# Patient Record
Sex: Female | Born: 1995 | Race: Black or African American | Hispanic: Yes | Marital: Married | Smoking: Never smoker
Health system: Southern US, Community
[De-identification: ages and names within clinical notes are randomized; demographics above are authoritative.]

## PROBLEM LIST (undated history)

## (undated) ENCOUNTER — Inpatient Hospital Stay (HOSPITAL_COMMUNITY): Payer: Self-pay

## (undated) DIAGNOSIS — N390 Urinary tract infection, site not specified: Secondary | ICD-10-CM

## (undated) DIAGNOSIS — A749 Chlamydial infection, unspecified: Secondary | ICD-10-CM

## (undated) DIAGNOSIS — R51 Headache: Secondary | ICD-10-CM

## (undated) DIAGNOSIS — D649 Anemia, unspecified: Secondary | ICD-10-CM

## (undated) DIAGNOSIS — R519 Headache, unspecified: Secondary | ICD-10-CM

## (undated) HISTORY — DX: Headache, unspecified: R51.9

## (undated) HISTORY — DX: Headache: R51

## (undated) HISTORY — PX: NO PAST SURGERIES: SHX2092

---
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
## 2018-09-09 ENCOUNTER — Encounter: Payer: Self-pay | Admitting: *Deleted

## 2018-09-10 ENCOUNTER — Ambulatory Visit: Payer: Medicaid Other | Admitting: Diagnostic Neuroimaging

## 2018-11-13 ENCOUNTER — Encounter (HOSPITAL_COMMUNITY): Payer: Self-pay | Admitting: Emergency Medicine

## 2018-11-13 ENCOUNTER — Emergency Department (HOSPITAL_COMMUNITY)
Admission: EM | Admit: 2018-11-13 | Discharge: 2018-11-14 | Disposition: A | Discharge status: Home / Self Care | Payer: BC Managed Care – PPO | Attending: Emergency Medicine | Admitting: Emergency Medicine

## 2018-11-13 ENCOUNTER — Other Ambulatory Visit: Payer: Self-pay

## 2018-11-13 DIAGNOSIS — R51 Headache: Secondary | ICD-10-CM | POA: Insufficient documentation

## 2018-11-13 DIAGNOSIS — R519 Headache, unspecified: Secondary | ICD-10-CM

## 2018-11-13 DIAGNOSIS — Z79899 Other long term (current) drug therapy: Secondary | ICD-10-CM | POA: Insufficient documentation

## 2018-11-13 DIAGNOSIS — R55 Syncope and collapse: Secondary | ICD-10-CM | POA: Insufficient documentation

## 2018-11-13 LAB — BASIC METABOLIC PANEL
Anion gap: 10 (ref 5–15)
BUN: 14 mg/dL (ref 6–20)
CO2: 23 mmol/L (ref 22–32)
Calcium: 9.5 mg/dL (ref 8.9–10.3)
Chloride: 103 mmol/L (ref 98–111)
Creatinine, Ser: 0.78 mg/dL (ref 0.44–1.00)
GFR calc Af Amer: >60 mL/min (ref >60)
GFR calc non Af Amer: >60 mL/min (ref >60)
Glucose, Bld: 91 mg/dL (ref 70–99)
Potassium: 4 mmol/L (ref 3.5–5.1)
Sodium: 136 mmol/L (ref 135–145)

## 2018-11-13 LAB — CBC
HCT: 41.6 % (ref 36.0–46.0)
Hemoglobin: 13.2 g/dL (ref 12.0–15.0)
MCH: 27.6 pg (ref 26.0–34.0)
MCHC: 31.7 g/dL (ref 30.0–36.0)
MCV: 87 fL (ref 80.0–100.0)
Platelets: 320 10*3/uL (ref 150–400)
RBC: 4.78 MIL/uL (ref 3.87–5.11)
RDW: 12.9 % (ref 11.5–15.5)
WBC: 9.8 10*3/uL (ref 4.0–10.5)
nRBC: 0 % (ref 0.0–0.2)

## 2018-11-13 LAB — URINALYSIS, ROUTINE W REFLEX MICROSCOPIC
Bilirubin Urine: NEGATIVE
Glucose, UA: NEGATIVE mg/dL
Ketones, ur: 80 mg/dL — AB
Nitrite: NEGATIVE
Protein, ur: NEGATIVE mg/dL
Specific Gravity, Urine: 1.019 (ref 1.005–1.030)
pH: 6 (ref 5.0–8.0)

## 2018-11-13 LAB — I-STAT BETA HCG BLOOD, ED (MC, WL, AP ONLY): I-stat hCG, quantitative: <5 m[IU]/mL (ref <5)

## 2018-11-13 MED ORDER — PROCHLORPERAZINE EDISYLATE 10 MG/2ML IJ SOLN
10.0000 mg | Freq: Once | INTRAMUSCULAR | Status: AC
  Administered 2018-11-14: 10 mg via INTRAVENOUS
  Filled 2018-11-13: qty 2

## 2018-11-13 MED ORDER — DIPHENHYDRAMINE HCL 50 MG/ML IJ SOLN
25.0000 mg | Freq: Once | INTRAMUSCULAR | Status: AC
  Administered 2018-11-14: 25 mg via INTRAVENOUS
  Filled 2018-11-13: qty 1

## 2018-11-13 MED ORDER — KETOROLAC TROMETHAMINE 15 MG/ML IJ SOLN
15.0000 mg | Freq: Once | INTRAMUSCULAR | Status: AC
  Administered 2018-11-14: 15 mg via INTRAVENOUS
  Filled 2018-11-13: qty 1

## 2018-11-13 MED ORDER — SODIUM CHLORIDE 0.9 % IV BOLUS
1000.0000 mL | Freq: Once | INTRAVENOUS | Status: AC
  Administered 2018-11-14: 1000 mL via INTRAVENOUS

## 2018-11-13 MED ORDER — ONDANSETRON 4 MG PO TBDP
4.0000 mg | ORAL_TABLET | Freq: Once | ORAL | Status: AC | PRN
  Administered 2018-11-13: 4 mg via ORAL
  Filled 2018-11-13: qty 1

## 2018-11-13 NOTE — ED Notes (Signed)
Pt c/o nausea.  

## 2018-11-13 NOTE — ED Triage Notes (Signed)
Pt reports headache that started today as well as a syncopal episode 1 hour PTA. Reports headache has improved, but still present behind her right eye. A&O x 4, ambulatory without difficulty.
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
## 2018-11-14 ENCOUNTER — Emergency Department (HOSPITAL_COMMUNITY): Payer: BC Managed Care – PPO

## 2018-11-14 NOTE — Discharge Instructions (Signed)
Alternate 600 mg of ibuprofen and [phone] mg of Tylenol every 3 hours as needed for pain. Do not exceed 4000 mg of Tylenol daily.  Take ibuprofen with food to avoid upset stomach issues.  Drink plenty of water and get plenty of rest.  Follow-up with your primary care physician or neurologist for reevaluation of your recurrent headaches.  Return to the emergency department if any concerning signs or symptoms develop such as high fevers, worsening headaches,altered mental status, or persistent vomiting.

## 2018-11-14 NOTE — ED Provider Notes (Signed)
Hx of migraines  onset R sided HA, Multiple neuro deficits and syncopal event. Vomit x2 Neuro exam normal Awaiting CT scan. Plan D/C after scan.   Patient CT scan negative.  Symptoms resolved after migraine cocktail.  Patient feels safe for discharge at this time.    Arthor Captain, PA-C 11/14/18 0370    Nira Conn, MD 11/14/18 2330

## 2018-11-14 NOTE — ED Notes (Signed)
Patient verbalizes understanding of discharge instructions. Opportunity for questioning and answers were provided. Armband removed by staff, pt discharged from ED By wheelchair

## 2019-09-30 LAB — OB RESULTS CONSOLE GC/CHLAMYDIA
Chlamydia: NEGATIVE
Gonorrhea: NEGATIVE

## 2019-09-30 LAB — OB RESULTS CONSOLE ABO/RH: RH Type: POSITIVE

## 2019-09-30 LAB — OB RESULTS CONSOLE HEPATITIS B SURFACE ANTIGEN: Hepatitis B Surface Ag: NEGATIVE

## 2019-09-30 LAB — OB RESULTS CONSOLE ANTIBODY SCREEN: Antibody Screen: NEGATIVE

## 2019-09-30 LAB — OB RESULTS CONSOLE HIV ANTIBODY (ROUTINE TESTING): HIV: NONREACTIVE

## 2019-09-30 LAB — OB RESULTS CONSOLE RUBELLA ANTIBODY, IGM: Rubella: IMMUNE

## 2019-09-30 LAB — OB RESULTS CONSOLE RPR: RPR: NONREACTIVE

## 2019-10-31 NOTE — L&D Delivery Note (Signed)
Patient was C/C/+2 and pushed for 60 minutes with epidural.    NSVD  female infant, Apgars 9,9, weight P.   The patient had midline second degree perineal laceration repaired with 2-0 vicryl rapide. Fundus was firm. EBL was expected amount. Placenta was delivered intact but very quickly after birth. Vagina was clear.  Delayed cord clamping done for 30-60 seconds while warming baby. Baby was vigorous and doing skin to skin with mother.  Kayla [name]  

## 2020-02-09 ENCOUNTER — Inpatient Hospital Stay (HOSPITAL_COMMUNITY)
Admission: AD | Admit: 2020-02-09 | Discharge: 2020-02-09 | Disposition: A | Discharge status: Home / Self Care | Payer: BC Managed Care – PPO | Attending: Obstetrics and Gynecology | Admitting: Obstetrics and Gynecology

## 2020-02-09 ENCOUNTER — Other Ambulatory Visit: Payer: Self-pay

## 2020-02-09 ENCOUNTER — Encounter (HOSPITAL_COMMUNITY): Payer: Self-pay | Admitting: Obstetrics and Gynecology

## 2020-02-09 DIAGNOSIS — O368130 Decreased fetal movements, third trimester, not applicable or unspecified: Secondary | ICD-10-CM | POA: Diagnosis not present

## 2020-02-09 DIAGNOSIS — Z3A28 28 weeks gestation of pregnancy: Secondary | ICD-10-CM | POA: Diagnosis not present

## 2020-02-09 DIAGNOSIS — O479 False labor, unspecified: Secondary | ICD-10-CM

## 2020-02-09 HISTORY — DX: Anemia, unspecified: D64.9

## 2020-02-09 NOTE — MAU Provider Note (Signed)
History     CSN: [id number]  Arrival date and time: 02/09/20 1510   First Provider Initiated Contact with Patient 02/09/20 1547      Chief Complaint  Patient presents with  . Abdominal Pain  . Decreased Fetal Movement   Kayla [name] is a 24 year old G1P0 at 28 weeks 4 days who is presenting to MAU for decreased fetal movement and irregular tightening of her abdomen. It will migrate all over, sometimes tightening on her left side, some times on her right. The episodes do not happen regularly, she will have 2-3 an hour at the most. They seem to lessen when she drinks a lot of water. The irregular tightening started over the weekend. She reports that she does drink 6-7 12 ounce water bottles a day.  She had a small smear of bloody-mucous-like discharge on her pillow a few nights ago, but has not had any bleeding since.  She has not felt her baby move since 7 PM on Sunday. 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 She normally feels "him doing somersaults" but admits that she has not been paying close attention to subtle movements.  OB History    Gravida  1   Para  0   Term  0   Preterm  0   AB  0   Living  0     SAB  0   TAB  0   Ectopic  0   Multiple  0   Live Births  0           Past Medical History:  Diagnosis Date  . Anemia    pregnancy  . Headache     History reviewed. No pertinent surgical history.  Family History  Problem Relation Age of Onset  . Hypertension Mother   . Other Mother        lymphodema  . Hypertension Maternal Grandmother   . Stroke Maternal Grandmother   . Hypertension Maternal Grandfather   . Other Maternal Grandfather        blood clotting disorder    Social History   Tobacco Use  . Smoking status: Never Smoker  . Smokeless tobacco: Never Used  Substance Use Topics  . Alcohol use: Not Currently  . Drug use: Never    Allergies: No Known Allergies  Medications Prior to Admission   Medication Sig Dispense Refill Last Dose  . Prenatal Vit-Fe Fumarate-FA (PRENATAL MULTIVITAMIN) TABS tablet Take 1 tablet by mouth daily at 12 noon.   02/08/2020 at Unknown time    Review of Systems  All other systems reviewed and are negative.  Physical Exam   Blood pressure 126/70, pulse 90, temperature (!) 97.4 F (36.3 C), temperature source Oral, resp. rate 18, weight 97.8 kg, SpO2 97 %.  Physical Exam  Nursing note and vitals reviewed. Constitutional: She is oriented to person, place, and time. She appears well-developed and well-nourished.  HENT:  Head: Normocephalic and atraumatic.  Eyes: Pupils are equal, round, and reactive to light. Conjunctivae and EOM are normal.  Cardiovascular: Normal rate, regular rhythm, normal heart sounds and intact distal pulses.  Respiratory: Effort normal and breath sounds normal.  GI: Soft. Bowel sounds are normal. She exhibits no distension and no mass. There is no abdominal tenderness. There is no rebound and no guarding.  Musculoskeletal:        General: Normal range of motion.     Cervical back: Normal range of motion  and neck supple.  Neurological: She is alert and oriented to person, place, and time. She has normal reflexes.  Skin: Skin is warm and dry.  Psychiatric: She has a normal mood and affect. Her behavior is normal. Judgment and thought content normal.    MAU Course  Procedures  MDM -Reassured by FHR -Has felt baby kick several times since presenting to MAU -No contractions on the monitor  NST -baseline: 145 -variability: moderate -accels: 15x15 -decels: none -interpretation: reactive  Assessment and Plan  24 yo G1P0 at 28 weeks 4 days EGA presenting to MAU for DFM and irregular tightening of her abdomen -NST reactive -educated on fetal kick counts -educated on signs/symptoms of labor -encouraged generous PO water intake -follow up with OB this week as scheduled  Jaquavis Felmlee L Shuan Statzer 02/09/2020, 3:57 PM

## 2020-02-09 NOTE — MAU Note (Signed)
No urine culture; scant collection for UA

## 2020-02-09 NOTE — MAU Note (Signed)
Patient states she has been having some abdominal pain for past few days that comes and goes and changes locations on abdomen.  Switches between pain on right side, left side, and sometimes just on the top.  Having pain maybe a few times per hour.  Also noticed some "smears of blood" on her pillow between her legs a few days ago.  Last felt fetal movement Sunday night at 7pm.

## 2020-02-09 NOTE — Discharge Instructions (Signed)
Abdominal Pain During Pregnancy  Belly (abdominal) pain is common during pregnancy. There are many possible causes. Most of the time, it is not a serious problem. Other times, it can be a sign that something is wrong with the pregnancy. Always tell your doctor if you have belly pain. Follow these instructions at home:  Do not have sex or put anything in your vagina until your pain goes away completely.  Get plenty of rest until your pain gets better.  Drink enough fluid to keep your pee (urine) pale yellow.  Take over-the-counter and prescription medicines only as told by your doctor.  Keep all follow-up visits as told by your doctor. This is important. Contact a doctor if:  Your pain continues or gets worse after resting.  You have lower belly pain that: ? Comes and goes at regular times. ? Spreads to your back. ? Feels like menstrual cramps.  You have pain or burning when you pee (urinate). Get help right away if:  You have a fever or chills.  You have vaginal bleeding.  You are leaking fluid from your vagina.  You are passing tissue from your vagina.  You throw up (vomit) for more than 24 hours.  You have watery poop (diarrhea) for more than 24 hours.  Your baby is moving less than usual.  You feel very weak or faint.  You have shortness of breath.  You have very bad pain in your upper belly. Summary  Belly (abdominal) pain is common during pregnancy. There are many possible causes.  If you have belly pain during pregnancy, tell your doctor right away.  Keep all follow-up visits as told by your doctor. This is important. This information is not intended to replace advice given to you by your health care provider. Make sure you discuss any questions you have with your health care provider. Document Revised: 02/03/2019 Document Reviewed: 01/18/2017 Elsevier Patient Education  [place].   Signs and Symptoms of Labor Labor is your body's natural  process of moving your baby, placenta, and umbilical cord out of your uterus. The process of labor usually starts when your baby is full-term, between 28 and 40 weeks of pregnancy. How will I know when I am close to going into labor? As your body prepares for labor and the birth of your baby, you may notice the following symptoms in the weeks and days before true labor starts:  Having a strong desire to get your home ready to receive your new baby. This is called nesting. Nesting may be a sign that labor is approaching, and it may occur several weeks before birth. Nesting may involve cleaning and organizing your home.  Passing a small amount of thick, bloody mucus out of your vagina (normal bloody show or losing your mucus plug). This may happen more than a week before labor begins, or it might occur right before labor begins as the opening of the cervix starts to widen (dilate). For some women, the entire mucus plug passes at once. For others, smaller portions of the mucus plug may gradually pass over several days.  Your baby moving (dropping) lower in your pelvis to get into position for birth (lightening). When this happens, you may feel more pressure on your bladder and pelvic bone and less pressure on your ribs. This may make it easier to breathe. It may also cause you to need to urinate more often and have problems with bowel movements.  Having "practice contractions" (Braxton Hicks contractions) that  occur at irregular (unevenly spaced) intervals that are more than 10 minutes apart. This is also called false labor. False labor contractions are common after exercise or sexual activity, and they will stop if you change position, rest, or drink fluids. These contractions are usually mild and do not get stronger over time. They may feel like: ? A backache or back pain. ? Mild cramps, similar to menstrual cramps. ? Tightening or pressure in your abdomen. Other early symptoms that labor may be  starting soon include:  Nausea or loss of appetite.  Diarrhea.  Having a sudden burst of energy, or feeling very tired.  Mood changes.  Having trouble sleeping. How will I know when labor has begun? Signs that true labor has begun may include:  Having contractions that come at regular (evenly spaced) intervals and increase in intensity. This may feel like more intense tightening or pressure in your abdomen that moves to your back. ? Contractions may also feel like rhythmic pain in your upper thighs or back that comes and goes at regular intervals. ? For first-time mothers, this change in intensity of contractions often occurs at a more gradual pace. ? Women who have given birth before may notice a more rapid progression of contraction changes.  Having a feeling of pressure in the vaginal area.  Your water breaking (rupture of membranes). This is when the sac of fluid that surrounds your baby breaks. When this happens, you will notice fluid leaking from your vagina. This may be clear or blood-tinged. Labor usually starts within 24 hours of your water breaking, but it may take longer to begin. ? Some women notice this as a gush of fluid. ? Others notice that their underwear repeatedly becomes damp. Follow these instructions at home:   When labor starts, or if your water breaks, call your health care provider or nurse care line. Based on your situation, they will determine when you should go in for an exam.  When you are in early labor, you may be able to rest and manage symptoms at home. Some strategies to try at home include: ? Breathing and relaxation techniques. ? Taking a warm bath or shower. ? Listening to music. ? Using a heating pad on the lower back for pain. If you are directed to use heat:  Place a towel between your skin and the heat source.  Leave the heat on for 20-30 minutes.  Remove the heat if your skin turns bright red. This is especially important if you are  unable to feel pain, heat, or cold. You may have a greater risk of getting burned. Get help right away if:  You have painful, regular contractions that are 5 minutes apart or less.  Labor starts before you are [redacted] weeks along in your pregnancy.  You have a fever.  You have a headache that does not go away.  You have bright red blood coming from your vagina.  You do not feel your baby moving.  You have a sudden onset of: ? Severe headache with vision problems. ? Nausea, vomiting, or diarrhea. ? Chest pain or shortness of breath. These symptoms may be an emergency. If your health care provider recommends that you go to the hospital or birth center where you plan to deliver, do not drive yourself. Have someone else drive you, or call emergency services (911 in the [place] Summary  Labor is your body's natural process of moving your baby, placenta, and umbilical cord out of your uterus.  The process of labor usually starts when your baby is full-term, between 19 and 40 weeks of pregnancy.  When labor starts, or if your water breaks, call your health care provider or nurse care line. Based on your situation, they will determine when you should go in for an exam. This information is not intended to replace advice given to you by your health care provider. Make sure you discuss any questions you have with your health care provider. Document Revised: 07/16/2017 Document Reviewed: 03/23/2017 Elsevier Patient Education  2020 Elsevier Inc[employer]   Fetal Movement Counts Patient Name: ________________________________________________ Patient Due Date: ____________________ What is a fetal movement count?  A fetal movement count is the number of times that you feel your baby move during a certain amount of time. This may also be called a fetal kick count. A fetal movement count is recommended for every pregnant woman. You may be asked to start counting fetal movements as early as week 28 of your  pregnancy. Pay attention to when your baby is most active. You may notice your baby's sleep and wake cycles. You may also notice things that make your baby move more. You should do a fetal movement count:  When your baby is normally most active.  At the same time each day. A good time to count movements is while you are resting, after having something to eat and drink. How do I count fetal movements? 1. Find a quiet, comfortable area. Sit, or lie down on your side. 2. Write down the date, the start time and stop time, and the number of movements that you felt between those two times. Take this information with you to your health care visits. 3. Write down your start time when you feel the first movement. 4. Count kicks, flutters, swishes, rolls, and jabs. You should feel at least 10 movements. 5. You may stop counting after you have felt 10 movements, or if you have been counting for 2 hours. Write down the stop time. 6. If you do not feel 10 movements in 2 hours, contact your health care provider for further instructions. Your health care provider may want to do additional tests to assess your baby's well-being. Contact a health care provider if:  You feel fewer than 10 movements in 2 hours.  Your baby is not moving like he or she usually does. Date: ____________ Start time: ____________ Stop time: ____________ Movements: ____________ Date: ____________ Start time: ____________ Stop time: ____________ Movements: ____________ Date: ____________ Start time: ____________ Stop time: ____________ Movements: ____________ Date: ____________ Start time: ____________ Stop time: ____________ Movements: ____________ Date: ____________ Start time: ____________ Stop time: ____________ Movements: ____________ Date: ____________ Start time: ____________ Stop time: ____________ Movements: ____________ Date: ____________ Start time: ____________ Stop time: ____________ Movements: ____________ Date:  ____________ Start time: ____________ Stop time: ____________ Movements: ____________ Date: ____________ Start time: ____________ Stop time: ____________ Movements: ____________ This information is not intended to replace advice given to you by your health care provider. Make sure you discuss any questions you have with your health care provider. Document Revised: 06/05/2019 Document Reviewed: 06/05/2019 Elsevier Patient Education  2020 [employer].

## 2020-03-14 ENCOUNTER — Other Ambulatory Visit: Payer: Self-pay

## 2020-03-14 ENCOUNTER — Inpatient Hospital Stay (HOSPITAL_COMMUNITY)
Admission: AD | Admit: 2020-03-14 | Discharge: 2020-03-14 | Disposition: A | Discharge status: Home / Self Care | Payer: BC Managed Care – PPO | Attending: Obstetrics & Gynecology | Admitting: Obstetrics & Gynecology

## 2020-03-14 DIAGNOSIS — O99891 Other specified diseases and conditions complicating pregnancy: Secondary | ICD-10-CM

## 2020-03-14 DIAGNOSIS — Z3A33 33 weeks gestation of pregnancy: Secondary | ICD-10-CM

## 2020-03-14 DIAGNOSIS — L282 Other prurigo: Secondary | ICD-10-CM | POA: Diagnosis not present

## 2020-03-14 DIAGNOSIS — O26893 Other specified pregnancy related conditions, third trimester: Secondary | ICD-10-CM | POA: Insufficient documentation

## 2020-03-14 DIAGNOSIS — R21 Rash and other nonspecific skin eruption: Secondary | ICD-10-CM | POA: Diagnosis present

## 2020-03-14 DIAGNOSIS — O99713 Diseases of the skin and subcutaneous tissue complicating pregnancy, third trimester: Secondary | ICD-10-CM

## 2020-03-14 DIAGNOSIS — Z8249 Family history of ischemic heart disease and other diseases of the circulatory system: Secondary | ICD-10-CM | POA: Insufficient documentation

## 2020-03-14 DIAGNOSIS — R238 Other skin changes: Secondary | ICD-10-CM | POA: Diagnosis not present

## 2020-03-14 LAB — COMPREHENSIVE METABOLIC PANEL
ALT: 16 U/L (ref 0–44)
AST: 15 U/L (ref 15–41)
Albumin: 2.9 g/dL — ABNORMAL LOW (ref 3.5–5.0)
Alkaline Phosphatase: 74 U/L (ref 38–126)
Anion gap: 12 (ref 5–15)
BUN: 10 mg/dL (ref 6–20)
CO2: 20 mmol/L — ABNORMAL LOW (ref 22–32)
Calcium: 9.3 mg/dL (ref 8.9–10.3)
Chloride: 105 mmol/L (ref 98–111)
Creatinine, Ser: 0.62 mg/dL (ref 0.44–1.00)
GFR calc Af Amer: >60 mL/min (ref >60)
GFR calc non Af Amer: >60 mL/min (ref >60)
Glucose, Bld: 99 mg/dL (ref 70–99)
Potassium: 3.8 mmol/L (ref 3.5–5.1)
Sodium: 137 mmol/L (ref 135–145)
Total Bilirubin: 0.2 mg/dL — ABNORMAL LOW (ref 0.3–1.2)
Total Protein: 6.4 g/dL — ABNORMAL LOW (ref 6.5–8.1)

## 2020-03-14 LAB — URINALYSIS, ROUTINE W REFLEX MICROSCOPIC
Bilirubin Urine: NEGATIVE
Glucose, UA: NEGATIVE mg/dL
Hgb urine dipstick: NEGATIVE
Ketones, ur: NEGATIVE mg/dL
Leukocytes,Ua: NEGATIVE
Nitrite: NEGATIVE
Protein, ur: NEGATIVE mg/dL
Specific Gravity, Urine: 1.021 (ref 1.005–1.030)
pH: 6 (ref 5.0–8.0)

## 2020-03-14 MED ORDER — HYDROCORTISONE 1 % EX CREA
TOPICAL_CREAM | Freq: Once | CUTANEOUS | Status: AC
  Filled 2020-03-14: qty 28

## 2020-03-14 MED ORDER — CALAMINE EX LOTN: 1.0000 "application " | TOPICAL_LOTION | CUTANEOUS | 0 refills | Status: DC | PRN

## 2020-03-14 NOTE — MAU Note (Signed)
Pt reports to MAU c/o a rash on her arms, legs, and feet. Pt reports some itching. Pt is using cortisone cream. No bleeding LOF. +FM. No pain currently.

## 2020-03-14 NOTE — MAU Provider Note (Signed)
History     CSN: [id number]  Arrival date and time: 03/14/20 2046   First Provider Initiated Contact with Patient 03/14/20 2121      Chief Complaint  Patient presents with  . Rash   Kayla [name] is a 24 y.o. G1P0 at [redacted]w[redacted]d who presents to MAU with complaints of rash. Patient reports that she has been noticing pruritis and rash over the past 1-2 days. Started on inner arms and abdomen then pruritus started occurring on inner thighs as well. She reports that pruritis gets worse at night and around striae of abdomen which she reports scratching so hard her skin sometimes bleeds. She denies problems or complications during this pregnancy. Denies abdominal pain, cramping, contractions, vaginal bleeding or discharge. +FM. Patient reports that she wanted to be seen to make sure "everything was okay with baby" and know the necessary follow ups. Patient has appointment in the office on 03/19/20 at [facility].    OB History    Gravida  1   Para  0   Term  0   Preterm  0   AB  0   Living  0     SAB  0   TAB  0   Ectopic  0   Multiple  0   Live Births  0           Past Medical History:  Diagnosis Date  . Anemia    pregnancy  . Headache     No past surgical history on file.  Family History  Problem Relation Age of Onset  . Hypertension Mother   . Other Mother        lymphodema  . Hypertension Maternal Grandmother   . Stroke Maternal Grandmother   . Hypertension Maternal Grandfather   . Other Maternal Grandfather        blood clotting disorder    Social History   Tobacco Use  . Smoking status: Never Smoker  . Smokeless tobacco: Never Used  Substance Use Topics  . Alcohol use: Not Currently  . Drug use: Never    Allergies: No Known Allergies  Medications Prior to Admission  Medication Sig Dispense Refill Last Dose  . Prenatal Vit-Fe Fumarate-FA (PRENATAL MULTIVITAMIN) TABS tablet Take 1 tablet by mouth daily at 12 noon.       Review of  Systems  Constitutional: Negative.   Respiratory: Negative.   Cardiovascular: Negative.   Genitourinary: Negative.   Musculoskeletal: Negative.   Skin: Positive for rash.       Pruritis   Neurological: Negative.   Psychiatric/Behavioral: Negative.    Physical Exam   Blood pressure 122/78, pulse (!) 109, temperature 98.7 F (37.1 C), temperature source Oral, resp. rate 17.  Physical Exam  Nursing note and vitals reviewed. Constitutional: She is oriented to person, place, and time. She appears well-developed and well-nourished. No distress.  Cardiovascular: Normal rate and regular rhythm.  Respiratory: Effort normal and breath sounds normal. No respiratory distress. She has no wheezes.  GI: There is no abdominal tenderness. There is no rebound and no guarding.  Gravid appropriate for gestational age, urticarial papules and erythema noted around striae.   Musculoskeletal:        General: No edema. Normal range of motion.  Neurological: She is alert and oriented to person, place, and time.  Skin: Rash noted.  Pruritic urticarial papules noted on inner arms, inner thighs and around striae   Psychiatric: She has a normal mood and affect. Her behavior  is normal. Thought content normal.    MAU Course  Procedures  MDM Orders Placed This Encounter  Procedures  . Urinalysis, Routine w reflex microscopic  . Bile acids, total  . Comprehensive metabolic panel   Differential diagnoses included Cholestasis of pregnancy vs PEP/PUPPS Discussed with patient what to expect with differential diagnoses and plan of care. Labs ordered but will not return tonight. Discussed with patient can discharge home with hydrocortisone cream and calamine lotion pending labs. Once labs return will call patient to discuss, if bile acid levels are increased or hepatic function labs are increased will have patient follow up in the office sooner than Friday.   Hydrocortisone cream applied in MAU and discussed  with patient medication for home use.   Educated and discussed that PEP is more common in the first pregnancy and there is not affect to fetus with diagnoses. Discussed with patient that if cholestasis is diagnosed then patient will need additional antenatal screening as can affect fetus. Patient verbalizes understanding.   Discussed reasons to return to MAU. Follow up as scheduled in the office. Return to MAU as needed. Pt stable at time of discharge. Will call patient with results of lab work.   Assessment and Plan   1. Pruritic rash   2. Generalized skin papules   3. [redacted] weeks gestation of pregnancy    Discharge home Follow up as scheduled in the office for prenatal care Return to MAU as needed for reasons discussed and/or emergencies  Will call patient with results of lab work, PEP vs Cholestasis  Hydration and [place]  Rx for calamine lotion  Hydrocortisone cream sent home with patient   Follow-up Information    Ob/Gyn, [facility] Follow up.   Why: Follow up as scheduled for prenatal care and return to MAU as needed  Contact information: [address] [place] [address] [phone]          Allergies as of 03/14/2020   No Known Allergies     Medication List    TAKE these medications   calamine lotion Apply 1 application topically as needed for itching.   prenatal multivitamin Tabs tablet Take 1 tablet by mouth daily at 12 noon.       Lajean Manes CNM 03/14/2020, 10:48 PM

## 2020-03-16 ENCOUNTER — Other Ambulatory Visit: Payer: Self-pay

## 2020-03-16 ENCOUNTER — Encounter (HOSPITAL_COMMUNITY): Payer: Self-pay

## 2020-03-16 ENCOUNTER — Inpatient Hospital Stay (HOSPITAL_COMMUNITY)
Admission: EM | Admit: 2020-03-16 | Discharge: 2020-03-16 | Disposition: A | Discharge status: Home / Self Care | Payer: BC Managed Care – PPO | Attending: Obstetrics and Gynecology | Admitting: Obstetrics and Gynecology

## 2020-03-16 DIAGNOSIS — O26893 Other specified pregnancy related conditions, third trimester: Secondary | ICD-10-CM | POA: Insufficient documentation

## 2020-03-16 DIAGNOSIS — R55 Syncope and collapse: Secondary | ICD-10-CM | POA: Diagnosis not present

## 2020-03-16 DIAGNOSIS — Z3A33 33 weeks gestation of pregnancy: Secondary | ICD-10-CM | POA: Insufficient documentation

## 2020-03-16 DIAGNOSIS — R519 Headache, unspecified: Secondary | ICD-10-CM | POA: Diagnosis not present

## 2020-03-16 DIAGNOSIS — G43909 Migraine, unspecified, not intractable, without status migrainosus: Secondary | ICD-10-CM | POA: Insufficient documentation

## 2020-03-16 DIAGNOSIS — Z3689 Encounter for other specified antenatal screening: Secondary | ICD-10-CM

## 2020-03-16 DIAGNOSIS — R21 Rash and other nonspecific skin eruption: Secondary | ICD-10-CM | POA: Insufficient documentation

## 2020-03-16 DIAGNOSIS — O212 Late vomiting of pregnancy: Secondary | ICD-10-CM | POA: Insufficient documentation

## 2020-03-16 LAB — BASIC METABOLIC PANEL
Anion gap: 9 (ref 5–15)
BUN: 6 mg/dL (ref 6–20)
CO2: 20 mmol/L — ABNORMAL LOW (ref 22–32)
Calcium: 8.7 mg/dL — ABNORMAL LOW (ref 8.9–10.3)
Chloride: 106 mmol/L (ref 98–111)
Creatinine, Ser: 0.47 mg/dL (ref 0.44–1.00)
GFR calc Af Amer: >60 mL/min (ref >60)
GFR calc non Af Amer: >60 mL/min (ref >60)
Glucose, Bld: 82 mg/dL (ref 70–99)
Potassium: 4.2 mmol/L (ref 3.5–5.1)
Sodium: 135 mmol/L (ref 135–145)

## 2020-03-16 LAB — URINALYSIS, ROUTINE W REFLEX MICROSCOPIC
Bilirubin Urine: NEGATIVE
Glucose, UA: NEGATIVE mg/dL
Hgb urine dipstick: NEGATIVE
Ketones, ur: 20 mg/dL — AB
Leukocytes,Ua: NEGATIVE
Nitrite: NEGATIVE
Protein, ur: NEGATIVE mg/dL
Specific Gravity, Urine: 1.014 (ref 1.005–1.030)
pH: 6 (ref 5.0–8.0)

## 2020-03-16 LAB — CBC
HCT: 35.3 % — ABNORMAL LOW (ref 36.0–46.0)
Hemoglobin: 11.5 g/dL — ABNORMAL LOW (ref 12.0–15.0)
MCH: 29.1 pg (ref 26.0–34.0)
MCHC: 32.6 g/dL (ref 30.0–36.0)
MCV: 89.4 fL (ref 80.0–100.0)
Platelets: 256 10*3/uL (ref 150–400)
RBC: 3.95 MIL/uL (ref 3.87–5.11)
RDW: 13.2 % (ref 11.5–15.5)
WBC: 9.9 10*3/uL (ref 4.0–10.5)
nRBC: 0 % (ref 0.0–0.2)

## 2020-03-16 LAB — CBG MONITORING, ED: Glucose-Capillary: 84 mg/dL (ref 70–99)

## 2020-03-16 LAB — BILE ACIDS, TOTAL: Bile Acids Total: 5.2 umol/L (ref 0.0–10.0)

## 2020-03-16 MED ORDER — DIPHENHYDRAMINE HCL 25 MG PO TABS
25.0000 mg | ORAL_TABLET | Freq: Four times a day (QID) | ORAL | 0 refills | Status: DC | PRN
Start: 2020-03-16 — End: 2020-05-07

## 2020-03-16 MED ORDER — METOCLOPRAMIDE HCL 5 MG/ML IJ SOLN
10.0000 mg | Freq: Once | INTRAMUSCULAR | Status: AC
  Administered 2020-03-16: 10 mg via INTRAVENOUS
  Filled 2020-03-16: qty 2

## 2020-03-16 MED ORDER — DIPHENHYDRAMINE HCL 50 MG/ML IJ SOLN
25.0000 mg | Freq: Once | INTRAMUSCULAR | Status: AC
  Administered 2020-03-16: 25 mg via INTRAVENOUS
  Filled 2020-03-16: qty 1

## 2020-03-16 MED ORDER — DEXAMETHASONE SODIUM PHOSPHATE 10 MG/ML IJ SOLN
10.0000 mg | Freq: Once | INTRAMUSCULAR | Status: AC
  Administered 2020-03-16: 10 mg via INTRAVENOUS
  Filled 2020-03-16: qty 1

## 2020-03-16 MED ORDER — SODIUM CHLORIDE 0.9% FLUSH: 3.0000 mL | Freq: Once | INTRAVENOUS | Status: DC

## 2020-03-16 MED ORDER — LACTATED RINGERS IV BOLUS
250.0000 mL | Freq: Once | INTRAVENOUS | Status: AC
  Administered 2020-03-16: 250 mL via INTRAVENOUS

## 2020-03-16 NOTE — ED Triage Notes (Signed)
Pt bib [facility] w/ c/o acute onset dizziness, tunnel vision, headache and vomiting. Pt [redacted] weeks pregnant. Denies hx pre-eclampsia or gestational diabetes. Pt received 4 mg IV zofran w/ EMS.
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
## 2020-03-16 NOTE — ED Provider Notes (Signed)
MSE was initiated and I personally evaluated the patient and placed orders (if any) at  3:23 PM on Mar 16, 2020.  Patient G1 P0 at 33 weeks 5 days gestation presenting to the ED via EMS with lightheadedness and vomiting.  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  She also reports left-sided headache which seems more severe than her usual headache, she has history of chronic migraines.  She states she has been having a nausea vomiting throughout her entire pregnancy.  She did not have a syncopal episode though did have some "splotchy: vision.  No known history of hypertension/preeclampsia or gestational diabetes.  No abdominal pain, leakage of fluid or vaginal bleeding reported.  Patient appears stable on evaluation, with stable vital signs.  CBG checked and is 84.  MAU provider, [name], accepting transfer.  The patient appears stable so that the remainder of the MSE may be completed by another provider.   Kayla [name], [place] N, PA-C 03/16/20 1525    Pricilla Loveless, MD 03/19/20 [phone]

## 2020-03-16 NOTE — Discharge Instructions (Signed)
Rash, Adult  A rash is a change in the color of your skin. A rash can also change the way your skin feels. There are many different conditions and factors that can cause a rash. Follow these instructions at home: The goal of treatment is to stop the itching and keep the rash from spreading. Watch for any changes in your symptoms. Let your doctor know about them. Follow these instructions to help with your condition: Medicine Take or apply over-the-counter and prescription medicines only as told by your doctor. These may include medicines:  To treat red or swollen skin (corticosteroid creams).  To treat itching.  To treat an allergy (oral antihistamines).  To treat very bad symptoms (oral corticosteroids).  Skin care  Put cool cloths (compresses) on the affected areas.  Do not scratch or rub your skin.  Avoid covering the rash. Make sure that the rash is exposed to air as much as possible. Managing itching and discomfort  Avoid hot showers or baths. These can make itching worse. A cold shower may help.  Try taking a bath with: ? Epsom salts. You can get these at your local pharmacy or grocery store. Follow the instructions on the package. ? Baking soda. Pour a small amount into the bath as told by your doctor. ? Colloidal oatmeal. You can get this at your local pharmacy or grocery store. Follow the instructions on the package.  Try putting baking soda paste onto your skin. Stir water into baking soda until it gets like a paste.  Try putting on a lotion that relieves itchiness (calamine lotion).  Keep cool and out of the sun. Sweating and being hot can make itching worse. General instructions   Rest as needed.  Drink enough fluid to keep your pee (urine) pale yellow.  Wear loose-fitting clothing.  Avoid scented soaps, detergents, and perfumes. Use gentle soaps, detergents, perfumes, and other cosmetic products.  Avoid anything that causes your rash. Keep a journal to  help track what causes your rash. Write down: ? What you eat. ? What cosmetic products you use. ? What you drink. ? What you wear. This includes jewelry.  Keep all follow-up visits as told by your doctor. This is important. Contact a doctor if:  You sweat at night.  You lose weight.  You pee (urinate) more than normal.  You pee less than normal, or you notice that your pee is a darker color than normal.  You feel weak.  You throw up (vomit).  Your skin or the whites of your eyes look yellow (jaundice).  Your skin: ? Tingles. ? Is numb.  Your rash: ? Does not go away after a few days. ? Gets worse.  You are: ? More thirsty than normal. ? More tired than normal.  You have: ? New symptoms. ? Pain in your belly (abdomen). ? A fever. ? Watery poop (diarrhea). Get help right away if:  You have a fever and your symptoms suddenly get worse.  You start to feel mixed up (confused).  You have a very bad headache or a stiff neck.  You have very bad joint pains or stiffness.  You have jerky movements that you cannot control (seizure).  Your rash covers all or most of your body. The rash may or may not be painful.  You have blisters that: ? Are on top of the rash. ? Grow larger. ? Grow together. ? Are painful. ? Are inside your nose or mouth.  You have a rash   that: ? Looks like purple pinprick-sized spots all over your body. ? Has a "bull's eye" or looks like a target. ? Is red and painful, causes your skin to peel, and is not from being in the sun too long. Summary  A rash is a change in the color of your skin. A rash can also change the way your skin feels.  The goal of treatment is to stop the itching and keep the rash from spreading.  Take or apply over-the-counter and prescription medicines only as told by your doctor.  Contact a doctor if you have new symptoms or symptoms that get worse.  Keep all follow-up visits as told by your doctor. This is  important. This information is not intended to replace advice given to you by your health care provider. Make sure you discuss any questions you have with your health care provider. Document Revised: 02/07/2019 Document Reviewed: 05/20/2018 Elsevier Patient Education  2020 [employer] Inc[employer]  

## 2020-03-16 NOTE — MAU Provider Note (Signed)
History     CSN: [id number]  Arrival date and time: 03/16/20 1432  First Provider Initiated Contact with Patient 03/16/20 1639     Chief Complaint  Patient presents with  . Dizziness  . Headache   HPI Kayla [name] is a 24 y.o. G1P0000 at [redacted]w[redacted]d who presents to MAU from [facility] for evaluation of headache and near-syncopal event while she was at work today. She states she was at rest, talking with her principal and noted new onset tunnel vision and felt faint. She endorses history of syncopal and near-syncopal events which began when she was a teenager. She states she was advised to have a Neurology consult but could not afford the out of pocket cost.  Patient is s/p evaluation in MAU for a rash across her upper and lower extremities on 03/14/2020. She reports using cold compresses and topical Hydrocortisone as advised but has not experienced  very much relief. She states she has checked her house for insects, allergens and cannot identify a cause.  Her rash seems to worsen with heat.  Patient endorses eating some cereal, an orange, a few strawberries and "pizza sticks" so far today.   OB History    Gravida  1   Para  0   Term  0   Preterm  0   AB  0   Living  0     SAB  0   TAB  0   Ectopic  0   Multiple  0   Live Births  0           Past Medical History:  Diagnosis Date  . Anemia    pregnancy  . Headache     History reviewed. No pertinent surgical history.  Family History  Problem Relation Age of Onset  . Hypertension Mother   . Other Mother        lymphodema  . Hypertension Maternal Grandmother   . Stroke Maternal Grandmother   . Hypertension Maternal Grandfather   . Other Maternal Grandfather        blood clotting disorder    Social History   Tobacco Use  . Smoking status: Never Smoker  . Smokeless tobacco: Never Used  Substance Use Topics  . Alcohol use: Not Currently  . Drug use: Never    Allergies:  Allergies  Allergen  Reactions  . Shrimp [Shellfish Allergy] Nausea And Vomiting and Rash    PT states she also gets a fever     Medications Prior to Admission  Medication Sig Dispense Refill Last Dose  . calamine lotion Apply 1 application topically as needed for itching. 120 mL 0 03/15/2020 at Unknown time  . hydrocortisone cream 1 % Apply 1 application topically 2 (two) times daily.   03/16/2020 at Unknown time  . Prenatal Vit-Fe Fumarate-FA (PRENATAL MULTIVITAMIN) TABS tablet Take 1 tablet by mouth daily at 12 noon.   03/15/2020 at Unknown time    Review of Systems  Constitutional: Negative for chills, fatigue and fever.  Gastrointestinal: Negative for abdominal pain, nausea and vomiting.  Musculoskeletal: Negative for back pain.  Neurological: Positive for dizziness.  All other systems reviewed and are negative.  Physical Exam   Blood pressure 116/66, pulse 89, temperature 97.8 F (36.6 C), temperature source Oral, resp. rate 12, height 5\' 4"  (1.626 m), weight 102.1 kg, SpO2 100 %.  Physical Exam  Nursing note and vitals reviewed. Constitutional: She is oriented to person, place, and time. She appears well-developed and well-nourished.  Cardiovascular: Normal rate and normal heart sounds.  Respiratory: Effort normal and breath sounds normal.  GI: Soft.  Gravid  Neurological: She is alert and oriented to person, place, and time.  Skin: Skin is warm and dry. Rash noted.  Raised red rash on arms, legs, toes. Not visible on any other areas of body. Excoriation marks visible. No drainage, no open lesions.   Psychiatric: She has a normal mood and affect. Her behavior is normal. Judgment and thought content normal.    MAU Course/MDM  Procedures   --S/p ICP workup, normal labs 03/14/2020 --Rash assessment including images reviewed with Dr. Phill Myron. Agrees with my recommendation to introduce PO Benadryl, continue topical Hydrocortisone, cold compresses. Rash is noticeably only on exposed areas  of body. Patient advised to revisit home and hygiene soaps, detergents, potential allergens. Avoid heat as this is a consistent trigger --Reactive NST: baseline 130, mod var, + 15 x 15 accels, no decels --Toco: occasional UI            Assessment and Plan  --24 y.o. G1P0000 at [redacted]w[redacted]d  --Reactive tracing --No concerning findings on labs --Continue topical treatment for rash, add PO Benadryl PRN --Pursue existing referral to Neurology --Review daily meals, snacks to ensure adequate nutrition in third trimester --Discharge home in stable condition  F/U: --Next appointment with [facility] is Friday 03/19/2020  Darlina Rumpf, CNM 03/16/2020, 7:27 PM

## 2020-03-19 ENCOUNTER — Other Ambulatory Visit: Payer: BC Managed Care – PPO

## 2020-04-08 LAB — OB RESULTS CONSOLE GBS: GBS: POSITIVE

## 2020-04-26 ENCOUNTER — Other Ambulatory Visit: Payer: Self-pay | Admitting: Obstetrics and Gynecology

## 2020-04-28 ENCOUNTER — Encounter (HOSPITAL_COMMUNITY): Payer: Self-pay | Admitting: *Deleted

## 2020-04-28 ENCOUNTER — Telehealth (HOSPITAL_COMMUNITY): Payer: Self-pay | Admitting: *Deleted

## 2020-04-28 NOTE — Telephone Encounter (Signed)
Preadmission screen  

## 2020-05-04 ENCOUNTER — Other Ambulatory Visit (HOSPITAL_COMMUNITY)
Admission: RE | Admit: 2020-05-04 | Discharge: 2020-05-04 | Disposition: A | Discharge status: Home / Self Care | Payer: BC Managed Care – PPO | Source: Ambulatory Visit | Attending: Obstetrics and Gynecology | Admitting: Obstetrics and Gynecology

## 2020-05-04 DIAGNOSIS — Z01812 Encounter for preprocedural laboratory examination: Secondary | ICD-10-CM | POA: Insufficient documentation

## 2020-05-04 DIAGNOSIS — Z20822 Contact with and (suspected) exposure to covid-19: Secondary | ICD-10-CM | POA: Insufficient documentation

## 2020-05-04 LAB — SARS CORONAVIRUS 2 (TAT 6-24 HRS): SARS Coronavirus 2: NEGATIVE

## 2020-05-05 ENCOUNTER — Other Ambulatory Visit (HOSPITAL_COMMUNITY): Payer: BC Managed Care – PPO

## 2020-05-05 ENCOUNTER — Encounter (HOSPITAL_COMMUNITY): Payer: Self-pay | Admitting: Obstetrics and Gynecology

## 2020-05-05 ENCOUNTER — Inpatient Hospital Stay (HOSPITAL_COMMUNITY)
Admission: AD | Admit: 2020-05-05 | Discharge: 2020-05-05 | Disposition: A | Discharge status: Home / Self Care | Payer: BC Managed Care – PPO | Source: Home / Self Care | Attending: Obstetrics and Gynecology | Admitting: Obstetrics and Gynecology

## 2020-05-05 ENCOUNTER — Other Ambulatory Visit: Payer: Self-pay

## 2020-05-05 DIAGNOSIS — O479 False labor, unspecified: Secondary | ICD-10-CM

## 2020-05-05 NOTE — MAU Note (Signed)
.   Kayla [name] is a 24 y.o. at [redacted]w[redacted]d here in MAU reporting: ctx that are 10 minutes apart that started at 0500 this morning. Patient denies VB or LOF. Endorses good fetal movement. GBS +. Scheduled for midnight induction.  Pain score: 7 Vitals:   05/05/20 0928  BP: 128/83  Pulse: 97  Resp: 15  Temp: 98.1 F (36.7 C)     FHT:144 Lab orders placed from triage:

## 2020-05-05 NOTE — Discharge Instructions (Signed)
Braxton Hicks Contractions °Contractions of the uterus can occur throughout pregnancy, but they are not always a sign that you are in labor. You may have practice contractions called Braxton Hicks contractions. These false labor contractions are sometimes confused with true labor. °What are Braxton Hicks contractions? °Braxton Hicks contractions are tightening movements that occur in the muscles of the uterus before labor. Unlike true labor contractions, these contractions do not result in opening (dilation) and thinning of the cervix. Toward the end of pregnancy (32-34 weeks), Braxton Hicks contractions can happen more often and may become stronger. These contractions are sometimes difficult to tell apart from true labor because they can be very uncomfortable. You should not feel embarrassed if you go to the hospital with false labor. °Sometimes, the only way to tell if you are in true labor is for your health care provider to look for changes in the cervix. The health care provider will do a physical exam and may monitor your contractions. If you are not in true labor, the exam should show that your cervix is not dilating and your water has not broken. °If there are no other health problems associated with your pregnancy, it is completely safe for you to be sent home with false labor. You may continue to have Braxton Hicks contractions until you go into true labor. °How to tell the difference between true labor and false labor °True labor °· Contractions last 30-70 seconds. °· Contractions become very regular. °· Discomfort is usually felt in the top of the uterus, and it spreads to the lower abdomen and low back. °· Contractions do not go away with walking. °· Contractions usually become more intense and increase in frequency. °· The cervix dilates and gets thinner. °False labor °· Contractions are usually shorter and not as strong as true labor contractions. °· Contractions are usually irregular. °· Contractions  are often felt in the front of the lower abdomen and in the groin. °· Contractions may go away when you walk around or change positions while lying down. °· Contractions get weaker and are shorter-lasting as time goes on. °· The cervix usually does not dilate or become thin. °Follow these instructions at home: ° °· Take over-the-counter and prescription medicines only as told by your health care provider. °· Keep up with your usual exercises and follow other instructions from your health care provider. °· Eat and drink lightly if you think you are going into labor. °· If Braxton Hicks contractions are making you uncomfortable: °? Change your position from lying down or resting to walking, or change from walking to resting. °? Sit and rest in a tub of warm water. °? Drink enough fluid to keep your urine pale yellow. Dehydration may cause these contractions. °? Do slow and deep breathing several times an hour. °· Keep all follow-up prenatal visits as told by your health care provider. This is important. °Contact a health care provider if: °· You have a fever. °· You have continuous pain in your abdomen. °Get help right away if: °· Your contractions become stronger, more regular, and closer together. °· You have fluid leaking or gushing from your vagina. °· You pass blood-tinged mucus (bloody show). °· You have bleeding from your vagina. °· You have low back pain that you never had before. °· You feel your baby’s head pushing down and causing pelvic pressure. °· Your baby is not moving inside you as much as it used to. °Summary °· Contractions that occur before labor are   called Braxton Hicks contractions, false labor, or practice contractions. °· Braxton Hicks contractions are usually shorter, weaker, farther apart, and less regular than true labor contractions. True labor contractions usually become progressively stronger and regular, and they become more frequent. °· Manage discomfort from Braxton Hicks contractions  by changing position, resting in a warm bath, drinking plenty of water, or practicing deep breathing. °This information is not intended to replace advice given to you by your health care provider. Make sure you discuss any questions you have with your health care provider. °Document Revised: 09/28/2017 Document Reviewed: 03/01/2017 °Elsevier Patient Education © 2020 [employer] ° °

## 2020-05-06 ENCOUNTER — Inpatient Hospital Stay (HOSPITAL_COMMUNITY): Payer: BC Managed Care – PPO

## 2020-05-06 ENCOUNTER — Inpatient Hospital Stay (HOSPITAL_COMMUNITY): Payer: BC Managed Care – PPO | Admitting: Anesthesiology

## 2020-05-06 ENCOUNTER — Inpatient Hospital Stay (HOSPITAL_COMMUNITY)
Admission: AD | Admit: 2020-05-06 | Discharge: 2020-05-07 | DRG: 807 | Disposition: A | Discharge status: Home / Self Care | Payer: BC Managed Care – PPO | Attending: Obstetrics and Gynecology | Admitting: Obstetrics and Gynecology

## 2020-05-06 ENCOUNTER — Encounter (HOSPITAL_COMMUNITY): Payer: Self-pay | Admitting: Obstetrics and Gynecology

## 2020-05-06 ENCOUNTER — Other Ambulatory Visit: Payer: Self-pay

## 2020-05-06 DIAGNOSIS — Z3A41 41 weeks gestation of pregnancy: Secondary | ICD-10-CM

## 2020-05-06 DIAGNOSIS — Z20822 Contact with and (suspected) exposure to covid-19: Secondary | ICD-10-CM | POA: Diagnosis present

## 2020-05-06 DIAGNOSIS — O99824 Streptococcus B carrier state complicating childbirth: Secondary | ICD-10-CM | POA: Diagnosis present

## 2020-05-06 DIAGNOSIS — O99214 Obesity complicating childbirth: Secondary | ICD-10-CM | POA: Diagnosis present

## 2020-05-06 DIAGNOSIS — O480 Post-term pregnancy: Principal | ICD-10-CM | POA: Diagnosis present

## 2020-05-06 LAB — CBC
HCT: 39 % (ref 36.0–46.0)
Hemoglobin: 12.3 g/dL (ref 12.0–15.0)
MCH: 26.9 pg (ref 26.0–34.0)
MCHC: 31.5 g/dL (ref 30.0–36.0)
MCV: 85.3 fL (ref 80.0–100.0)
Platelets: 323 10*3/uL (ref 150–400)
RBC: 4.57 MIL/uL (ref 3.87–5.11)
RDW: 13.7 % (ref 11.5–15.5)
WBC: 10 10*3/uL (ref 4.0–10.5)
nRBC: 0 % (ref 0.0–0.2)

## 2020-05-06 LAB — ABO/RH: ABO/RH(D): A POS

## 2020-05-06 LAB — TYPE AND SCREEN
ABO/RH(D): A POS
Antibody Screen: NEGATIVE

## 2020-05-06 LAB — RPR: RPR Ser Ql: NONREACTIVE

## 2020-05-06 MED ORDER — LACTATED RINGERS IV SOLN
500.0000 mL | Freq: Once | INTRAVENOUS | Status: AC
  Administered 2020-05-06: 500 mL via INTRAVENOUS

## 2020-05-06 MED ORDER — ONDANSETRON HCL 4 MG/2ML IJ SOLN: 4.0000 mg | INTRAMUSCULAR | Status: DC | PRN

## 2020-05-06 MED ORDER — TERBUTALINE SULFATE 1 MG/ML IJ SOLN: 0.2500 mg | Freq: Once | INTRAMUSCULAR | Status: DC | PRN

## 2020-05-06 MED ORDER — IBUPROFEN 800 MG PO TABS
800.0000 mg | ORAL_TABLET | Freq: Three times a day (TID) | ORAL | Status: DC
  Administered 2020-05-06 – 2020-05-07 (×3): 800 mg via ORAL
  Filled 2020-05-06 (×3): qty 1

## 2020-05-06 MED ORDER — DIBUCAINE (PERIANAL) 1 % EX OINT: 1.0000 "application " | TOPICAL_OINTMENT | CUTANEOUS | Status: DC | PRN

## 2020-05-06 MED ORDER — BENZOCAINE-MENTHOL 20-0.5 % EX AERO
1.0000 "application " | INHALATION_SPRAY | CUTANEOUS | Status: DC | PRN
  Administered 2020-05-07: 1 via TOPICAL
  Filled 2020-05-06: qty 56

## 2020-05-06 MED ORDER — PHENYLEPHRINE 40 MCG/ML (10ML) SYRINGE FOR IV PUSH (FOR BLOOD PRESSURE SUPPORT)
80.0000 ug | PREFILLED_SYRINGE | INTRAVENOUS | Status: DC | PRN
  Administered 2020-05-06 (×2): 80 ug via INTRAVENOUS

## 2020-05-06 MED ORDER — SOD CITRATE-CITRIC ACID 500-334 MG/5ML PO SOLN: 30.0000 mL | ORAL | Status: DC | PRN

## 2020-05-06 MED ORDER — EPHEDRINE 5 MG/ML INJ
10.0000 mg | INTRAVENOUS | Status: DC | PRN
  Filled 2020-05-06: qty 10

## 2020-05-06 MED ORDER — FENTANYL CITRATE (PF) 100 MCG/2ML IJ SOLN
50.0000 ug | INTRAMUSCULAR | Status: DC | PRN
  Administered 2020-05-06 (×3): 100 ug via INTRAVENOUS
  Filled 2020-05-06 (×3): qty 2

## 2020-05-06 MED ORDER — EPHEDRINE 5 MG/ML INJ: 10.0000 mg | INTRAVENOUS | Status: DC | PRN

## 2020-05-06 MED ORDER — PHENYLEPHRINE 40 MCG/ML (10ML) SYRINGE FOR IV PUSH (FOR BLOOD PRESSURE SUPPORT)
80.0000 ug | PREFILLED_SYRINGE | INTRAVENOUS | Status: DC | PRN
  Filled 2020-05-06: qty 10

## 2020-05-06 MED ORDER — ONDANSETRON HCL 4 MG/2ML IJ SOLN: 4.0000 mg | Freq: Four times a day (QID) | INTRAMUSCULAR | Status: DC | PRN

## 2020-05-06 MED ORDER — PENICILLIN G POT IN DEXTROSE 60000 UNIT/ML IV SOLN
3.0000 10*6.[IU] | INTRAVENOUS | Status: DC
  Administered 2020-05-06 (×2): 3 10*6.[IU] via INTRAVENOUS
  Filled 2020-05-06 (×2): qty 50

## 2020-05-06 MED ORDER — SENNOSIDES-DOCUSATE SODIUM 8.6-50 MG PO TABS
2.0000 | ORAL_TABLET | ORAL | Status: DC
  Administered 2020-05-07: 2 via ORAL
  Filled 2020-05-06: qty 2

## 2020-05-06 MED ORDER — SODIUM CHLORIDE 0.9 % IV SOLN
5.0000 10*6.[IU] | Freq: Once | INTRAVENOUS | Status: AC
  Administered 2020-05-06: 5 10*6.[IU] via INTRAVENOUS
  Filled 2020-05-06: qty 5

## 2020-05-06 MED ORDER — OXYTOCIN-SODIUM CHLORIDE 30-0.9 UT/500ML-% IV SOLN
1.0000 m[IU]/min | INTRAVENOUS | Status: DC
  Administered 2020-05-06: 2 m[IU]/min via INTRAVENOUS
  Filled 2020-05-06: qty 500

## 2020-05-06 MED ORDER — ACETAMINOPHEN 325 MG PO TABS
650.0000 mg | ORAL_TABLET | ORAL | Status: DC | PRN
  Administered 2020-05-06: 650 mg via ORAL
  Filled 2020-05-06: qty 2

## 2020-05-06 MED ORDER — DIPHENHYDRAMINE HCL 25 MG PO CAPS: 25.0000 mg | ORAL_CAPSULE | Freq: Four times a day (QID) | ORAL | Status: DC | PRN

## 2020-05-06 MED ORDER — WITCH HAZEL-GLYCERIN EX PADS: 1.0000 "application " | MEDICATED_PAD | CUTANEOUS | Status: DC | PRN

## 2020-05-06 MED ORDER — MEASLES, MUMPS & RUBELLA VAC IJ SOLR: 0.5000 mL | Freq: Once | INTRAMUSCULAR | Status: DC

## 2020-05-06 MED ORDER — LACTATED RINGERS IV SOLN: INTRAVENOUS | Status: DC

## 2020-05-06 MED ORDER — SODIUM CHLORIDE 0.9% FLUSH: 3.0000 mL | Freq: Two times a day (BID) | INTRAVENOUS | Status: DC

## 2020-05-06 MED ORDER — DIPHENHYDRAMINE HCL 50 MG/ML IJ SOLN: 12.5000 mg | INTRAMUSCULAR | Status: DC | PRN

## 2020-05-06 MED ORDER — MAGNESIUM HYDROXIDE 400 MG/5ML PO SUSP: 30.0000 mL | ORAL | Status: DC | PRN

## 2020-05-06 MED ORDER — SODIUM CHLORIDE 0.9 % IV SOLN: 250.0000 mL | INTRAVENOUS | Status: DC | PRN

## 2020-05-06 MED ORDER — ONDANSETRON HCL 4 MG PO TABS: 4.0000 mg | ORAL_TABLET | ORAL | Status: DC | PRN

## 2020-05-06 MED ORDER — SODIUM CHLORIDE 0.9% FLUSH: 3.0000 mL | INTRAVENOUS | Status: DC | PRN

## 2020-05-06 MED ORDER — PRENATAL MULTIVITAMIN CH
1.0000 | ORAL_TABLET | Freq: Every day | ORAL | Status: DC
  Administered 2020-05-07: 1 via ORAL
  Filled 2020-05-06: qty 1

## 2020-05-06 MED ORDER — TETANUS-DIPHTH-ACELL PERTUSSIS 5-2.5-18.5 LF-MCG/0.5 IM SUSP: 0.5000 mL | Freq: Once | INTRAMUSCULAR | Status: DC

## 2020-05-06 MED ORDER — ZOLPIDEM TARTRATE 5 MG PO TABS: 5.0000 mg | ORAL_TABLET | Freq: Every evening | ORAL | Status: DC | PRN

## 2020-05-06 MED ORDER — METHYLERGONOVINE MALEATE 0.2 MG PO TABS: 0.2000 mg | ORAL_TABLET | ORAL | Status: DC | PRN

## 2020-05-06 MED ORDER — LACTATED RINGERS IV SOLN: 500.0000 mL | INTRAVENOUS | Status: DC | PRN

## 2020-05-06 MED ORDER — METHYLERGONOVINE MALEATE 0.2 MG/ML IJ SOLN: 0.2000 mg | INTRAMUSCULAR | Status: DC | PRN

## 2020-05-06 MED ORDER — OXYTOCIN BOLUS FROM INFUSION
333.0000 mL | Freq: Once | INTRAVENOUS | Status: AC
  Administered 2020-05-06: 333 mL via INTRAVENOUS

## 2020-05-06 MED ORDER — SIMETHICONE 80 MG PO CHEW: 80.0000 mg | CHEWABLE_TABLET | ORAL | Status: DC | PRN

## 2020-05-06 MED ORDER — FERROUS SULFATE 325 (65 FE) MG PO TABS
325.0000 mg | ORAL_TABLET | Freq: Two times a day (BID) | ORAL | Status: DC
  Administered 2020-05-07: 325 mg via ORAL
  Filled 2020-05-06: qty 1

## 2020-05-06 MED ORDER — OXYCODONE-ACETAMINOPHEN 5-325 MG PO TABS
2.0000 | ORAL_TABLET | ORAL | Status: DC | PRN
  Administered 2020-05-07: 1 via ORAL
  Filled 2020-05-06: qty 2

## 2020-05-06 MED ORDER — SODIUM CHLORIDE (PF) 0.9 % IJ SOLN
INTRAMUSCULAR | Status: DC | PRN
  Administered 2020-05-06: 12 mL/h via EPIDURAL

## 2020-05-06 MED ORDER — LIDOCAINE HCL (PF) 1 % IJ SOLN: 30.0000 mL | INTRAMUSCULAR | Status: DC | PRN

## 2020-05-06 MED ORDER — OXYTOCIN-SODIUM CHLORIDE 30-0.9 UT/500ML-% IV SOLN: 2.5000 [IU]/h | INTRAVENOUS | Status: DC

## 2020-05-06 MED ORDER — MISOPROSTOL 25 MCG QUARTER TABLET
25.0000 ug | ORAL_TABLET | ORAL | Status: DC | PRN
  Administered 2020-05-06: 25 ug via VAGINAL
  Filled 2020-05-06: qty 1

## 2020-05-06 MED ORDER — FENTANYL-BUPIVACAINE-NACL 0.5-0.125-0.9 MG/250ML-% EP SOLN
12.0000 mL/h | EPIDURAL | Status: DC | PRN
  Filled 2020-05-06: qty 250

## 2020-05-06 MED ORDER — COCONUT OIL OIL: 1.0000 "application " | TOPICAL_OIL | Status: DC | PRN

## 2020-05-06 MED ORDER — ACETAMINOPHEN 325 MG PO TABS
650.0000 mg | ORAL_TABLET | ORAL | Status: DC | PRN
  Administered 2020-05-07: 650 mg via ORAL
  Filled 2020-05-06: qty 2

## 2020-05-06 MED ORDER — LIDOCAINE HCL (PF) 1 % IJ SOLN
INTRAMUSCULAR | Status: DC | PRN
  Administered 2020-05-06 (×2): 5 mL via EPIDURAL

## 2020-05-06 NOTE — Anesthesia Postprocedure Evaluation (Signed)
Anesthesia Post Note  Patient: Kayla [name]  Procedure(s) Performed: AN AD HOC LABOR EPIDURAL     Patient location during evaluation: Mother Baby Anesthesia Type: Epidural Level of consciousness: awake and alert Pain management: pain level controlled Vital Signs Assessment: post-procedure vital signs reviewed and stable Respiratory status: spontaneous breathing, nonlabored ventilation and respiratory function stable Cardiovascular status: stable Postop Assessment: no headache, no backache and epidural receding Anesthetic complications: no   No complications documented.  Last Vitals:  Vitals:   05/06/20 1630 05/06/20 1712  BP: 116/70 120/61  Pulse: 84 96  Resp: 18 20  Temp:  36.6 C  SpO2:      Last Pain:  Vitals:   05/06/20 1712  TempSrc: Oral  PainSc: 0-No pain   Pain Goal:                Epidural/Spinal Function Cutaneous sensation: Tingles (05/06/20 1712), Patient able to flex knees: No (05/06/20 1712), Patient able to lift hips off bed: No (05/06/20 1712), Back pain beyond tenderness at insertion site: No (05/06/20 1712), Progressively worsening motor and/or sensory loss: No (05/06/20 1712)  Adelae Yodice

## 2020-05-06 NOTE — Anesthesia Procedure Notes (Signed)
Epidural Patient location during procedure: OB Start time: 05/06/2020 6:54 AM End time: 05/06/2020 7:06 AM  Staffing Anesthesiologist: Heather Roberts, MD Performed: anesthesiologist   Preanesthetic Checklist Completed: patient identified, IV checked, site marked, risks and benefits discussed, monitors and equipment checked, pre-op evaluation and timeout performed  Epidural Patient position: sitting Prep: DuraPrep Patient monitoring: heart rate, cardiac monitor, continuous pulse ox and blood pressure Approach: midline Location: L2-L3 Injection technique: LOR saline  Needle:  Needle type: Tuohy  Needle gauge: 17 G Needle length: 9 cm Needle insertion depth: 8 cm Catheter size: 20 Guage Catheter at skin depth: 11 cm Test dose: negative and Other  Assessment Events: blood not aspirated, injection not painful, no injection resistance and negative IV test  Additional Notes Informed consent obtained prior to proceeding including risk of failure, 1% risk of PDPH, risk of minor discomfort and bruising.  Discussed rare but serious complications including epidural abscess, permanent nerve injury, epidural hematoma.  Discussed alternatives to epidural analgesia and patient desires to proceed.  Timeout performed pre-procedure verifying patient name, procedure, and platelet count.  Patient tolerated procedure well.

## 2020-05-06 NOTE — H&P (Signed)
24 y.o. [redacted]w[redacted]d  G1P0000 comes in for postdates induction.  Otherwise has good fetal movement and no bleeding.  Past Medical History:  Diagnosis Date  . Anemia    pregnancy  . Headache     Past Surgical History:  Procedure Laterality Date  . NO PAST SURGERIES      OB History  Gravida Para Term Preterm AB Living  1 0 0 0 0 0  SAB TAB Ectopic Multiple Live Births  0 0 0 0 0    # Outcome Date GA Lbr Len/2nd Weight Sex Delivery Anes PTL Lv  1 Current             Social History   Socioeconomic History  . Marital status: Single    Spouse name: Not on file  . Number of children: Not on file  . Years of education: Not on file  . Highest education level: Not on file  Occupational History  . Not on file  Tobacco Use  . Smoking status: Never Smoker  . Smokeless tobacco: Never Used  Vaping Use  . Vaping Use: Never used  Substance and Sexual Activity  . Alcohol use: Not Currently  . Drug use: Never  . Sexual activity: Not Currently  Other Topics Concern  . Not on file  Social History Narrative  . Not on file   Social Determinants of Health   Financial Resource Strain:   . Difficulty of Paying Living Expenses:   Food Insecurity:   . Worried About Programme researcher, broadcasting/film/video in the Last Year:   . Barista in the Last Year:   Transportation Needs:   . Freight forwarder (Medical):   Marland Kitchen Lack of Transportation (Non-Medical):   Physical Activity:   . Days of Exercise per Week:   . Minutes of Exercise per Session:   Stress:   . Feeling of Stress :   Social Connections:   . Frequency of Communication with Friends and Family:   . Frequency of Social Gatherings with Friends and Family:   . Attends Religious Services:   . Active Member of Clubs or Organizations:   . Attends Banker Meetings:   Marland Kitchen Marital Status:   Intimate Partner Violence:   . Fear of Current or Ex-Partner:   . Emotionally Abused:   Marland Kitchen Physically Abused:   . Sexually Abused:    Shrimp  [shellfish allergy]    Prenatal Transfer Tool  Maternal Diabetes: No Genetic Screening: Declined Maternal Ultrasounds/Referrals: Normal Fetal Ultrasounds or other Referrals:  None Maternal Substance Abuse:  No Significant Maternal Medications:  None Significant Maternal Lab Results: Group B Strep positive  Other PNC: uncomplicated.    Vitals:   05/06/20 0706 05/06/20 0710 05/06/20 0725 05/06/20 0730  BP: (!) 120/59 118/74 108/63 105/60  Pulse: (!) 108 96 (!) 102 91  Resp:  20 16 18   Temp:  98.3 F (36.8 C)    TempSrc:  Oral    SpO2:  98% 95% 95%  Weight:      Height:        Lungs/Cor:  NAD Abdomen:  soft, gravid Ex:  no cords, erythema SVE:  4/100/-1 per nurse FHTs:  140s, good STV, NST R; Cat 1 tracing although recently 2 variable decels Toco:  q 2-4   A/P   Term induction with cytotec, now can move to pitocin.  Will AROM.  GBS POS- PCN given.  

## 2020-05-06 NOTE — Anesthesia Preprocedure Evaluation (Signed)
Anesthesia Evaluation  Patient identified by MRN, date of birth, ID band Patient awake    Reviewed: Allergy & Precautions, NPO status , Patient's Chart, lab work & pertinent test results  History of Anesthesia Complications Negative for: history of anesthetic complications  Airway Mallampati: II  TM Distance: >3 FB Neck ROM: Full    Dental no notable dental hx. (+) Dental Advisory Given   Pulmonary neg pulmonary ROS,    Pulmonary exam normal        Cardiovascular negative cardio ROS Normal cardiovascular exam     Neuro/Psych  Headaches, negative psych ROS   GI/Hepatic negative GI ROS, Neg liver ROS,   Endo/Other  Morbid obesity  Renal/GU negative Renal ROS  negative genitourinary   Musculoskeletal negative musculoskeletal ROS (+)   Abdominal   Peds negative pediatric ROS (+)  Hematology negative hematology ROS (+)   Anesthesia Other Findings   Reproductive/Obstetrics (+) Pregnancy                             Anesthesia Physical Anesthesia Plan  ASA: III  Anesthesia Plan: Epidural   Post-op Pain Management:    Induction:   PONV Risk Score and Plan:   Airway Management Planned: Natural Airway  Additional Equipment:   Intra-op Plan:   Post-operative Plan:   Informed Consent: I have reviewed the patients History and Physical, chart, labs and discussed the procedure including the risks, benefits and alternatives for the proposed anesthesia with the patient or authorized representative who has indicated his/her understanding and acceptance.     Dental advisory given  Plan Discussed with: CRNA and Anesthesiologist  Anesthesia Plan Comments:         Anesthesia Quick Evaluation

## 2020-05-07 LAB — CBC
HCT: 30.1 % — ABNORMAL LOW (ref 36.0–46.0)
Hemoglobin: 9.6 g/dL — ABNORMAL LOW (ref 12.0–15.0)
MCH: 27 pg (ref 26.0–34.0)
MCHC: 31.9 g/dL (ref 30.0–36.0)
MCV: 84.8 fL (ref 80.0–100.0)
Platelets: 214 10*3/uL (ref 150–400)
RBC: 3.55 MIL/uL — ABNORMAL LOW (ref 3.87–5.11)
RDW: 13.7 % (ref 11.5–15.5)
WBC: 12.2 10*3/uL — ABNORMAL HIGH (ref 4.0–10.5)
nRBC: 0 % (ref 0.0–0.2)

## 2020-05-07 MED ORDER — IBUPROFEN 800 MG PO TABS: 800.0000 mg | ORAL_TABLET | Freq: Three times a day (TID) | ORAL | 0 refills | Status: DC

## 2020-05-07 NOTE — Progress Notes (Signed)
PPD#1 Pt without complaints. Would like to go home. Lochia mild VSSAF Hgb-9.5 IMP/ Doing well Plan/ Will discharge to home.

## 2020-05-07 NOTE — Discharge Summary (Signed)
     Postpartum Discharge Summary      Patient Name: Kayla [name] DOB: 04-29-1996 MRN: [id number]  Date of admission: 05/06/2020 Delivery date:05/06/2020  Delivering provider: Carrington Clamp  Date of discharge: 05/07/2020  Admitting diagnosis: Post-dates pregnancy [O48.0] Intrauterine pregnancy: [redacted]w[redacted]d       Hospital course: Onset of Labor With Vaginal Delivery      24 y.o. yo G1P1001 at [redacted]w[redacted]d was admitted  on 05/06/2020. Patient had an uncomplicated labor course as follows:  Membrane Rupture Time/Date: 1:38 PM ,05/06/2020   Delivery Method:Vaginal, Spontaneous  Episiotomy: None  Lacerations:  2nd degree;Perineal  Patient had an uncomplicated postpartum course.  She is ambulating, tolerating a regular diet, passing flatus, and urinating well. Patient is discharged home in stable condition on 05/07/20.  Newborn Data: Birth date:05/06/2020  Birth time:3:22 PM  Gender:Female  Living status:Living  Apgars:8 ,9  Weight:3325 g     Vitals:   05/06/20 2000 05/06/20 2100 05/07/20 0045 05/07/20 0445  BP: 127/76 112/69 116/68 114/62  Pulse: 98 90 75 86  Resp: 18 18 18 18   Temp: 98.8 F (37.1 C) 97.8 F (36.6 C) 98.7 F (37.1 C) 98.1 F (36.7 C)  TempSrc: Oral Oral Oral Oral  SpO2: 99% 100% 100% 100%  Weight:      Height:       General: alert Lochia: appropriate Labs: Lab Results  Component Value Date   WBC 12.2 (H) 05/07/2020   HGB 9.6 (L) 05/07/2020   HCT 30.1 (L) 05/07/2020   MCV 84.8 05/07/2020   PLT 214 05/07/2020   CMP Latest Ref Rng & Units 03/16/2020  Glucose 70 - 99 mg/dL 82  BUN 6 - 20 mg/dL 6  Creatinine 03/18/2020 - 4.13 mg/dL 2.44  Sodium 0.10 - 272 mmol/L 135  Potassium 3.5 - 5.1 mmol/L 4.2  Chloride 98 - 111 mmol/L 106  CO2 22 - 32 mmol/L 20(L)  Calcium 8.9 - 10.3 mg/dL 536)  Total Protein 6.5 - 8.1 g/dL -  Total Bilirubin 0.3 - 1.2 mg/dL -  Alkaline Phos 38 - 6.4(Q U/L -  AST 15 - 41 U/L -  ALT 0 - 44 U/L -     After visit meds:  Allergies as of  05/07/2020      Reactions   Shrimp [shellfish Allergy] Nausea And Vomiting, Rash   PT states she also gets a fever       Medication List    STOP taking these medications   calamine lotion   diphenhydrAMINE 25 MG tablet Commonly known as: BENADRYL   hydrocortisone cream 1 %     TAKE these medications   ibuprofen 800 MG tablet Commonly known as: ADVIL Take 1 tablet (800 mg total) by mouth 3 (three) times daily.   prenatal multivitamin Tabs tablet Take 1 tablet by mouth daily at 12 noon.        Discharge home in stable condition Infant Feeding: Breast Infant Disposition:home with mother Discharge instruction: per After Visit Summary and Postpartum booklet. Activity: Advance as tolerated. Pelvic rest for 6 weeks.  Diet: routine diet Follow up Visit:  Follow-up Information    07/08/2020, MD. Schedule an appointment as soon as possible for a visit in 1 month(s).   Specialty: Obstetrics and Gynecology Contact information: [address] [place] [phone]                   05/07/2020 07/08/2020, MD

## 2020-05-07 NOTE — Lactation Note (Signed)
This note was copied from a baby's chart. Lactation Consultation Note  Patient Name: Boy Stana Bayon CHYIF'O Date: 05/07/2020 Reason for consult: Initial assessment;Difficult latch;Primapara;Term Baby is 61 hours old.  Lactation order just received.  Mom has been formula feeding due to difficult latch.  She states she tried using a nipple shield last night but it was not helpful.  Both nipples are flat but compressible.  Mom pre pumped x 5 minutes with symphony pump.  Drop of colostrum expressed.  Assisted with positioning baby in football hold.  Baby became very fussy.  After a few attempts baby latched off and on for 5 minutes. Mom felt like baby was biting and decided to take a break.  Breast shells given with instructions.  Instructed to pump every 3 hours x 15-20 minutes.  Instructed mom to attempt latching baby with feeding cues and call for assist prn.  Breastfeeding consultation services and support information given to patient.  Maternal Data Has patient been taught Hand Expression?: Yes Does the patient have breastfeeding experience prior to this delivery?: No  Feeding Feeding Type: Breast Fed Nipple Type: Slow - flow  LATCH Score Latch: Repeated attempts needed to sustain latch, nipple held in mouth throughout feeding, stimulation needed to elicit sucking reflex.  Audible Swallowing: None  Type of Nipple: Flat  Comfort (Breast/Nipple): Soft / non-tender  Hold (Positioning): Assistance needed to correctly position infant at breast and maintain latch.  LATCH Score: 5  Interventions Interventions: Shells;DEBP  Lactation Tools Discussed/Used Tools: Pump;Shells Shell Type: Inverted Breast pump type: Double-Electric Breast Pump Pump Review: Setup, frequency, and cleaning;Milk Storage Initiated by:: [email or web address] Date initiated:: 05/07/20   Consult Status Consult Status: Follow-up Date: 05/08/20 Follow-up type: In-patient    Huston Foley 05/07/2020, 1:47  PM

## 2020-05-08 ENCOUNTER — Ambulatory Visit: Payer: Self-pay

## 2020-05-08 NOTE — Lactation Note (Signed)
This note was copied from a baby's chart. Lactation Consultation Note  Patient Name: Kayla [name] [id number] Date: 05/08/2020 Reason for consult: Follow-up assessment;Term  Mom is currently bottle feeding with formula. She had some difficulty with latch at first, last attempt 10 pm last night. She met with the [facility] and states she feels more confident with the babies latch especially on the left. Education given on engorgement prevention, milk supply and nipple care. Mom instructed to use coconut oil and expressed colostrum on nipples in between feeding. If Mom is not successful with latch, she was instructed to increase volume of formula to 1/ once to 1 ounce per feed. Mom has a pump at home she will use to increase supply and has a f/u appointment with [facility] on Monday.   Maternal Data Formula Feeding for Exclusion: Yes Reason for exclusion: Mother's choice to formula feed on admision  Feeding Feeding Type: Bottle Fed - Formula Nipple Type: Slow - flow  LATCH Score                   Interventions    Lactation Tools Discussed/Used     Consult Status Consult Status: Complete Date: 05/08/20 Follow-up type: Call as needed    Kayla Desta  [name] 05/08/2020, 8:54 AM

## 2020-05-10 LAB — SURGICAL PATHOLOGY

## 2020-10-30 DIAGNOSIS — K8020 Calculus of gallbladder without cholecystitis without obstruction: Secondary | ICD-10-CM

## 2020-10-30 HISTORY — DX: Calculus of gallbladder without cholecystitis without obstruction: K80.20

## 2020-12-05 ENCOUNTER — Emergency Department (HOSPITAL_COMMUNITY): Payer: Medicaid Other

## 2020-12-05 ENCOUNTER — Other Ambulatory Visit: Payer: Self-pay

## 2020-12-05 ENCOUNTER — Emergency Department (HOSPITAL_COMMUNITY)
Admission: EM | Admit: 2020-12-05 | Discharge: 2020-12-06 | Disposition: A | Discharge status: Home / Self Care | Payer: Medicaid Other | Attending: Emergency Medicine | Admitting: Emergency Medicine

## 2020-12-05 DIAGNOSIS — R1011 Right upper quadrant pain: Secondary | ICD-10-CM | POA: Diagnosis present

## 2020-12-05 DIAGNOSIS — R110 Nausea: Secondary | ICD-10-CM | POA: Insufficient documentation

## 2020-12-05 DIAGNOSIS — K8080 Other cholelithiasis without obstruction: Secondary | ICD-10-CM | POA: Insufficient documentation

## 2020-12-05 DIAGNOSIS — R109 Unspecified abdominal pain: Secondary | ICD-10-CM

## 2020-12-05 LAB — COMPREHENSIVE METABOLIC PANEL
ALT: 202 U/L — ABNORMAL HIGH (ref 0–44)
AST: 165 U/L — ABNORMAL HIGH (ref 15–41)
Albumin: 3.6 g/dL (ref 3.5–5.0)
Alkaline Phosphatase: 81 U/L (ref 38–126)
Anion gap: 9 (ref 5–15)
BUN: 10 mg/dL (ref 6–20)
CO2: 25 mmol/L (ref 22–32)
Calcium: 9.1 mg/dL (ref 8.9–10.3)
Chloride: 104 mmol/L (ref 98–111)
Creatinine, Ser: 0.79 mg/dL (ref 0.44–1.00)
GFR, Estimated: >60 mL/min (ref >60)
Glucose, Bld: 111 mg/dL — ABNORMAL HIGH (ref 70–99)
Potassium: 3.6 mmol/L (ref 3.5–5.1)
Sodium: 138 mmol/L (ref 135–145)
Total Bilirubin: 1.6 mg/dL — ABNORMAL HIGH (ref 0.3–1.2)
Total Protein: 7.3 g/dL (ref 6.5–8.1)

## 2020-12-05 LAB — URINALYSIS, ROUTINE W REFLEX MICROSCOPIC
Bilirubin Urine: NEGATIVE
Glucose, UA: NEGATIVE mg/dL
Ketones, ur: 5 mg/dL — AB
Leukocytes,Ua: NEGATIVE
Nitrite: NEGATIVE
Protein, ur: 100 mg/dL — AB
RBC / HPF: >50 RBC/hpf — ABNORMAL HIGH (ref 0–5)
Specific Gravity, Urine: 1.026 (ref 1.005–1.030)
pH: 5 (ref 5.0–8.0)

## 2020-12-05 LAB — CBC
HCT: 42.3 % (ref 36.0–46.0)
Hemoglobin: 13.2 g/dL (ref 12.0–15.0)
MCH: 27.4 pg (ref 26.0–34.0)
MCHC: 31.2 g/dL (ref 30.0–36.0)
MCV: 87.9 fL (ref 80.0–100.0)
Platelets: 341 10*3/uL (ref 150–400)
RBC: 4.81 MIL/uL (ref 3.87–5.11)
RDW: 13.4 % (ref 11.5–15.5)
WBC: 9.6 10*3/uL (ref 4.0–10.5)
nRBC: 0 % (ref 0.0–0.2)

## 2020-12-05 LAB — I-STAT BETA HCG BLOOD, ED (MC, WL, AP ONLY): I-stat hCG, quantitative: <5 m[IU]/mL (ref <5)

## 2020-12-05 LAB — LIPASE, BLOOD: Lipase: 36 U/L (ref 11–51)

## 2020-12-05 NOTE — ED Triage Notes (Signed)
Pt presents to ED POV. Pt c/o upper abd pain that radiates towards back. Pt reports that pain occurs at night when she lies down. Pain has been increasing since October. Pt reports that she has seen by PCP and told she needs to be seen by GI specialist.

## 2020-12-06 ENCOUNTER — Emergency Department (HOSPITAL_COMMUNITY): Payer: Medicaid Other

## 2020-12-06 MED ORDER — IBUPROFEN 400 MG PO TABS
600.0000 mg | ORAL_TABLET | Freq: Once | ORAL | Status: AC
  Administered 2020-12-06: 600 mg via ORAL
  Filled 2020-12-06: qty 1

## 2020-12-06 MED ORDER — GADOBUTROL 1 MMOL/ML IV SOLN
8.5000 mL | Freq: Once | INTRAVENOUS | Status: AC | PRN
  Administered 2020-12-06: 8.5 mL via INTRAVENOUS

## 2020-12-06 NOTE — Discharge Instructions (Signed)
Follow up as directed by surgery.  Avoid fatty or greasy food.

## 2020-12-06 NOTE — Consult Note (Signed)
Neosha Switalski [facility] 31-Aug-1996  [id number].    Requesting MD: Cheron Schaumann Chief Complaint/Reason for Consult: symptomatic cholelithiasis  HPI:  Ms. Palazzola is a 25 yo female who has been having intermittent RUQ for the past few months. It started shortly after she delivered her baby. It begins after she eats and usually lasts for a few minutes. She also sometimes has nausea and vomiting. The pain begins in the RUQ and radiates across her upper abdomen and to her back. She presented to the ED after having a more severe episode of pain. She is afebrile and hemodynamically stable in the ED. Labs are significant for a mild transaminitis and Tbili of 1.6. Lipase is normal. RUQ US showed gallstones with mild CBD dilation. A MRCP was done and showed no evidence of choledocholithiasis. General surgery was consulted for further evaluation. The patient's pain has now resolved and she is feeling well. She has had no prior abdominal surgeries.  ROS: Review of Systems  Constitutional: Negative for fever.  Respiratory: Negative for shortness of breath and stridor.   Cardiovascular: Negative for chest pain.  Gastrointestinal: Positive for abdominal pain and nausea.  Musculoskeletal: Negative for joint pain.  Skin: Negative for rash.  Neurological: Negative for speech change and focal weakness.    Family History  Problem Relation Age of Onset  . Hypertension Mother   . Other Mother        lymphodema  . Hypertension Maternal Grandmother   . Stroke Maternal Grandmother   . Hypertension Maternal Grandfather   . Other Maternal Grandfather        blood clotting disorder    Past Medical History:  Diagnosis Date  . Anemia    pregnancy  . Headache     Past Surgical History:  Procedure Laterality Date  . NO PAST SURGERIES      Social History:  reports that she has never smoked. She has never used smokeless tobacco. She reports previous alcohol use. She reports that she does not use  drugs.  Allergies:  Allergies  Allergen Reactions  . Shrimp [Shellfish Allergy] Nausea And Vomiting and Rash    PT states she also gets a fever     (Not in a hospital admission)    Physical Exam: Blood pressure 131/84, pulse 63, temperature 98 F (36.7 C), temperature source Oral, resp. rate 17, height 5\' 4"  (1.626 m), weight 86.2 kg, SpO2 100 %, unknown if currently breastfeeding. General: resting comfortably, appears stated age, no apparent distress Neurological: alert and oriented, no focal deficits, cranial nerves grossly in tact HEENT: normocephalic, atraumatic, oropharynx clear, no scleral icterus CV: regular rate and rhythm, no murmurs, extremities warm and well-perfused Respiratory: normal work of breathing, lungs clear to auscultation bilaterally, symmetric chest wall expansion Abdomen: soft, nondistended, minimally tender in RUQ. No masses or organomegaly. No surgical scars. Extremities: warm and well-perfused, no deformities, moving all extremities spontaneously Psychiatric: normal mood and affect Skin: warm and dry, no jaundice, no rashes or lesions   Results for orders placed or performed during the hospital encounter of 12/05/20 (from the past 48 hour(s))  Lipase, blood     Status: None   Collection Time: 12/05/20  8:30 PM  Result Value Ref Range   Lipase 36 11 - 51 U/L    Comment: Performed at [facility], [address], [place], [place]  Comprehensive metabolic panel     Status: Abnormal   Collection Time: 12/05/20  8:30 PM  Result  Value Ref Range   Sodium 138 135 - 145 mmol/L   Potassium 3.6 3.5 - 5.1 mmol/L   Chloride 104 98 - 111 mmol/L   CO2 25 22 - 32 mmol/L   Glucose, Bld 111 (H) 70 - 99 mg/dL    Comment: Glucose reference range applies only to samples taken after fasting for at least 8 hours.   BUN 10 6 - 20 mg/dL   Creatinine, Ser 6.76 0.44 - 1.00 mg/dL   Calcium 9.1 8.9 - 19.5 mg/dL   Total Protein 7.3 6.5 - 8.1 g/dL   Albumin  3.6 3.5 - 5.0 g/dL   AST 093 (H) 15 - 41 U/L   ALT 202 (H) 0 - 44 U/L   Alkaline Phosphatase 81 38 - 126 U/L   Total Bilirubin 1.6 (H) 0.3 - 1.2 mg/dL   GFR, Estimated >26 >71 mL/min    Comment: (NOTE) Calculated using the CKD-EPI Creatinine Equation (2021)    Anion gap 9 5 - 15    Comment: Performed at [facility], [address], [place], [place] [address]  CBC     Status: None   Collection Time: 12/05/20  8:30 PM  Result Value Ref Range   WBC 9.6 4.0 - 10.5 K/uL   RBC 4.81 3.87 - 5.11 MIL/uL   Hemoglobin 13.2 12.0 - 15.0 g/dL   HCT 99.8 33.8 - 25.0 %   MCV 87.9 80.0 - 100.0 fL   MCH 27.4 26.0 - 34.0 pg   MCHC 31.2 30.0 - 36.0 g/dL   RDW 53.9 76.7 - 34.1 %   Platelets 341 150 - 400 K/uL   nRBC 0.0 0.0 - 0.2 %    Comment: Performed at [facility], [address], [place], [place] [address]  Urinalysis, Routine w reflex microscopic     Status: Abnormal   Collection Time: 12/05/20  8:59 PM  Result Value Ref Range   Color, Urine AMBER (A) YELLOW    Comment: BIOCHEMICALS MAY BE AFFECTED BY COLOR   APPearance CLOUDY (A) CLEAR   Specific Gravity, Urine 1.026 1.005 - 1.030   pH 5.0 5.0 - 8.0   Glucose, UA NEGATIVE NEGATIVE mg/dL   Hgb urine dipstick LARGE (A) NEGATIVE   Bilirubin Urine NEGATIVE NEGATIVE   Ketones, ur 5 (A) NEGATIVE mg/dL   Protein, ur 240 (A) NEGATIVE mg/dL   Nitrite NEGATIVE NEGATIVE   Leukocytes,Ua NEGATIVE NEGATIVE   RBC / HPF >50 (H) 0 - 5 RBC/hpf   WBC, UA 6-10 0 - 5 WBC/hpf   Bacteria, UA FEW (A) NONE SEEN   Squamous Epithelial / LPF 6-10 0 - 5   Mucus PRESENT     Comment: Performed at [facility], [address], [place], [place] [address]  I-Stat beta hCG blood, ED     Status: None   Collection Time: 12/05/20  9:15 PM  Result Value Ref Range   I-stat hCG, quantitative <5.0 <5 mIU/mL   Comment 3            Comment:   GEST. AGE      CONC.  (mIU/mL)   <=1 WEEK        5 - 50     2 WEEKS       50 - 500     3 WEEKS       100 -  10,000     4 WEEKS     1,000 - 30,000  FEMALE AND NON-PREGNANT FEMALE:     LESS THAN 5 mIU/mL    MR ABDOMEN MRCP W WO CONTAST  Result Date: 12/06/2020 CLINICAL DATA:  Concern for choledocholithiasis. Biliary obstruction suspected. EXAM: MRI ABDOMEN WITHOUT AND WITH CONTRAST (INCLUDING MRCP) TECHNIQUE: Multiplanar multisequence MR imaging of the abdomen was performed both before and after the administration of intravenous contrast. Heavily T2-weighted images of the biliary and pancreatic ducts were obtained, and three-dimensional MRCP images were rendered by post processing. CONTRAST:  8.57mL GADAVIST GADOBUTROL 1 MMOL/ML IV SOLN COMPARISON:  Same day abdominal ultrasound. FINDINGS: Lower chest: No acute findings. Hepatobiliary: No focal hepatic lesion. Numerous coli lithiasis measuring between 2 and 4 mm. No gallbladder wall thickening or pericholecystic fluid. No biliary ductal dilatation. The common duct measures 4 mm without visualized intraluminal filling defect. Pancreas: No mass, inflammatory changes, or other parenchymal abnormality identified. Spleen:  Splenule Adrenals/Urinary Tract: No masses identified. No evidence of hydronephrosis. Stomach/Bowel: Visualized portions within the abdomen are unremarkable. Vascular/Lymphatic: No pathologically enlarged lymph nodes identified. No abdominal aortic aneurysm demonstrated. Other:  None. Musculoskeletal: No suspicious bone lesions identified. IMPRESSION: 1. No biliary ductal dilatation or visualized intraluminal filling defect, to suggest choledocholithiasis. 2. Cholelithiasis measuring between 2 and 4 mm. No evidence of acute cholecystitis. Electronically Signed   By: Maudry Mayhew MD   On: 12/06/2020 19:32   US Abdomen Limited RUQ (LIVER/GB)  Result Date: 12/06/2020 CLINICAL DATA:  Abdominal pain. EXAM: ULTRASOUND ABDOMEN LIMITED RIGHT UPPER QUADRANT COMPARISON:  None. FINDINGS: Gallbladder: There are multiple gallstones without evidence for  gallbladder wall thickening or pericholecystic free fluid. The sonographic Eulah Pont sign is reported as negative. The gallbladder is distended. Common bile duct: Diameter: 10 mm Liver: The hepatic echogenicity is slightly increased. There appears to be intrahepatic biliary ductal dilatation. Portal vein is patent on color Doppler imaging with normal direction of blood flow towards the liver. Other: The pancreatic duct is dilated measuring up to approximately 4 mm in diameter. IMPRESSION: 1. There is cholelithiasis without secondary signs of acute cholecystitis. 2. Intrahepatic and extrahepatic biliary ductal dilatation raises concern for underlying choledocholithiasis. Follow-up with MRCP/ERCP is recommended. 3. The pancreatic duct is prominent, again suggestive of an obstructing process distally. Electronically Signed   By: Katherine Mantle M.D.   On: 12/06/2020 00:18      Assessment/Plan 25 yo female with symptoms biliary colic. I reviewed her labs, RUQ [place], and MRCP. US shows numerous shadowing gallstones but no pericholecystic fluid or signs of acute cholecystitis. There are no stones in her common bile duct on MRCP and the bile duct appears grossly normal without significant dilation. Patient most likely passed a gallstone. She does not have any signs of acute cholecystitis, pancreatitis or cholangitis and her pain has resolved. I recommend cholecystectomy to prevent recurrent choledocholithiasis but this can be done electively in the absence of cholangitis or pancreatitis. I discussed this with the patient. Will arrange outpatient follow up in our clinic to schedule surgery. I messaged our schedulers and also provided the patient with the clinic phone number.  [name], MD [facility] Surgery General, Hepatobiliary and Pancreatic Surgery 12/06/20 8:24 PM

## 2020-12-06 NOTE — ED Notes (Signed)
To MRI. RN to MRI to start IV

## 2020-12-06 NOTE — ED Provider Notes (Signed)
[facility] EMERGENCY DEPARTMENT Provider Note   CSN: [id number] Arrival date & time: 12/05/20  1951     History Chief Complaint  Patient presents with  . Abdominal Pain    Kayla [name] is a 25 y.o. female.  The history is provided by the patient. No language interpreter was used.  Abdominal Pain Pain location:  Generalized Pain quality: aching   Pain radiates to:  Back Pain severity:  No pain Timing:  Constant Progression:  Resolved Chronicity:  Recurrent Relieved by:  Nothing Worsened by:  Nothing Ineffective treatments:  None tried Associated symptoms: nausea   Pt reports she has had right upper quadrant abdominal pain on and off since October.  Pt has pain when she eats.      Past Medical History:  Diagnosis Date  . Anemia    pregnancy  . Headache     Patient Active Problem List   Diagnosis Date Noted  . Post-dates pregnancy 05/06/2020    Past Surgical History:  Procedure Laterality Date  . NO PAST SURGERIES       OB History    Gravida  1   Para  1   Term  1   Preterm  0   AB  0   Living  1     SAB  0   IAB  0   Ectopic  0   Multiple  0   Live Births  1           Family History  Problem Relation Age of Onset  . Hypertension Mother   . Other Mother        lymphodema  . Hypertension Maternal Grandmother   . Stroke Maternal Grandmother   . Hypertension Maternal Grandfather   . Other Maternal Grandfather        blood clotting disorder    Social History   Tobacco Use  . Smoking status: Never Smoker  . Smokeless tobacco: Never Used  Vaping Use  . Vaping Use: Never used  Substance Use Topics  . Alcohol use: Not Currently  . Drug use: Never    Home Medications Prior to Admission medications   Medication Sig Start Date End Date Taking? Authorizing Provider  ibuprofen (ADVIL) 800 MG tablet Take 1 tablet (800 mg total) by mouth 3 (three) times daily. 05/07/20   Levi Aland, MD  Prenatal  Vit-Fe Fumarate-FA (PRENATAL MULTIVITAMIN) TABS tablet Take 1 tablet by mouth daily at 12 noon.    [provider]    Allergies    Shrimp [shellfish allergy]  Review of Systems   Review of Systems  Gastrointestinal: Positive for abdominal pain and nausea.  All other systems reviewed and are negative.   Physical Exam Updated Vital Signs BP 120/79   Pulse 60   Temp 97.8 F (36.6 C) (Oral)   Resp 15   Ht 5\' 4"  (1.626 m)   Wt 86.2 kg   SpO2 98%   BMI 32.61 kg/m   Physical Exam Vitals and nursing note reviewed.  Constitutional:      Appearance: She is well-developed and well-nourished.  HENT:     Head: Normocephalic.  Eyes:     Extraocular Movements: EOM normal.  Cardiovascular:     Rate and Rhythm: Normal rate and regular rhythm.     Heart sounds: Normal heart sounds.  Pulmonary:     Effort: Pulmonary effort is normal.  Abdominal:     General: Abdomen is flat. Bowel sounds are  normal. There is abdominal bruit. There is no distension.     Palpations: Abdomen is soft.     Tenderness: There is abdominal tenderness in the right upper quadrant.     Hernia: No hernia is present.  Musculoskeletal:        General: Normal range of motion.     Cervical back: Normal range of motion.  Skin:    General: Skin is warm.  Neurological:     Mental Status: She is alert and oriented to person, place, and time.  Psychiatric:        Mood and Affect: Mood and affect normal.     ED Results / Procedures / Treatments   Labs (all labs ordered are listed, but only abnormal results are displayed) Labs Reviewed  COMPREHENSIVE METABOLIC PANEL - Abnormal; Notable for the following components:      Result Value   Glucose, Bld 111 (*)    AST 165 (*)    ALT 202 (*)    Total Bilirubin 1.6 (*)    All other components within normal limits  URINALYSIS, ROUTINE W REFLEX MICROSCOPIC - Abnormal; Notable for the following components:   Color, Urine AMBER (*)    APPearance CLOUDY (*)     Hgb urine dipstick LARGE (*)    Ketones, ur 5 (*)    Protein, ur 100 (*)    RBC / HPF >50 (*)    Bacteria, UA FEW (*)    All other components within normal limits  LIPASE, BLOOD  CBC  I-STAT BETA HCG BLOOD, ED ([facility], [facility], [facility] ONLY)    EKG EKG Interpretation  Date/Time:  Sunday December 05 2020 20:25:05 EST Ventricular Rate:  83 PR Interval:  158 QRS Duration: 72 QT Interval:  346 QTC Calculation: 406 R Axis:   70 Text Interpretation: Normal sinus rhythm Normal ECG No old tracing to compare Confirmed by Dione Booze ([id number]) on 12/06/2020 12:01:30 AM   Radiology US Abdomen Limited RUQ (LIVER/GB)  Result Date: 12/06/2020 CLINICAL DATA:  Abdominal pain. EXAM: ULTRASOUND ABDOMEN LIMITED RIGHT UPPER QUADRANT COMPARISON:  None. FINDINGS: Gallbladder: There are multiple gallstones without evidence for gallbladder wall thickening or pericholecystic free fluid. The sonographic Eulah Pont sign is reported as negative. The gallbladder is distended. Common bile duct: Diameter: 10 mm Liver: The hepatic echogenicity is slightly increased. There appears to be intrahepatic biliary ductal dilatation. Portal vein is patent on color Doppler imaging with normal direction of blood flow towards the liver. Other: The pancreatic duct is dilated measuring up to approximately 4 mm in diameter. IMPRESSION: 1. There is cholelithiasis without secondary signs of acute cholecystitis. 2. Intrahepatic and extrahepatic biliary ductal dilatation raises concern for underlying choledocholithiasis. Follow-up with MRCP/ERCP is recommended. 3. The pancreatic duct is prominent, again suggestive of an obstructing process distally. Electronically Signed   By: Katherine Mantle M.D.   On: 12/06/2020 00:18    Procedures Procedures   Medications Ordered in ED Medications  ibuprofen (ADVIL) tablet 600 mg (600 mg Oral Given 12/06/20 0253)    ED Course  I have reviewed the triage vital signs and the nursing notes.  Pertinent labs &  imaging results that were available during my care of the patient were reviewed by me and considered in my medical decision making (see chart for details).    MDM Rules/Calculators/A&P                          MDM:  Ultrasound shows choleolithiasis.  I spoke to Dr. Adela Lank and reviewed [place].  He advised MRCP.  Call him if blockage.  Surgery if not blockage.  I spoke to Dr. [name] general surgeon who will see pt here and plan for outpatient surgery.  Final Clinical Impression(s) / ED Diagnoses Final diagnoses:  Abdominal pain    Rx / DC Orders ED Discharge Orders    None    An After Visit Summary was printed and given to the patient.    Elson Areas, Cordelia Poche 12/06/20 2023    Clarene Duke Ambrose [place], MD 12/07/20 1155

## 2020-12-08 ENCOUNTER — Ambulatory Visit: Payer: Self-pay | Admitting: Surgery

## 2020-12-08 NOTE — H&P (Signed)
History of Present Illness (Kayla Hall L. [name] MD; 12/08/2020 11:50 AM) The patient is a 25 year old female who presents for evaluation of gall stones.HPI: [name] is a 25 yo female who presents for follow up of gallstones. She was seen in the ED 2 nights ago with severe upper abdominal pain. She has been having intermittent RUQ abdominal pain for the last few months, which usually occurs after eating. It is sometimes associated with nausea and vomiting. In the ED her labs were significant for mildly elevated LFTs with a Tbili of 1.6. She was afebrile. RUQ US showed gallstones with no evidence of acute cholecystitis. MRCP showed no evidence of choledocholithiasis. Lipase was normal. The patient's pain resolved while in the ED and she was discharged. She presents today to discuss cholecystectomy. She has not had any further pain since her ED visit but has been consuming mostly soup and bland foods.  PMH: none  PSH: none  FHx: HTN (mother)  Social: never smoker    Past Surgical History Kayla [name], [name]; 12/08/2020 11:13 AM) No pertinent past surgical history   Diagnostic Studies History Kayla [name], [name]; 12/08/2020 11:13 AM) Colonoscopy  never Mammogram  never Pap Smear  1-5 years ago  Allergies (Kayla [name], [name]; 12/08/2020 11:15 AM) No Known Drug Allergies  [12/08/2020]: No Known Allergies  [12/08/2020]: Allergies Reconciled   Medication History (Kayla [name], [name]; 12/08/2020 11:15 AM) No Current Medications Medications Reconciled  Social History Kayla [name], [name]; 12/08/2020 11:13 AM) Alcohol use  Occasional alcohol use. Caffeine use  Carbonated beverages, Coffee, Tea. No drug use   Family History Kayla [name], [name]; 12/08/2020 11:13 AM) Hypertension  Mother.  Pregnancy / Birth History Kayla [name], [name]; 12/08/2020 11:13 AM) Age at menarche  12 years. Gravida  1 Length (months) of  breastfeeding  3-6 Maternal age  55-25 Regular periods   Other Problems Kayla [name], [name]; 12/08/2020 11:13 AM) Back Pain  Cholelithiasis     Review of Systems (Kayla [name] [name]; 12/08/2020 11:13 AM) General Not Present- Appetite Loss, Chills, Fatigue, Fever, Night Sweats, Weight Gain and Weight Loss. Skin Not Present- Change in Wart/Mole, Dryness, Hives, Jaundice, New Lesions, Non-Healing Wounds, Rash and Ulcer. HEENT Not Present- Earache, Hearing Loss, Hoarseness, Nose Bleed, Oral Ulcers, Ringing in the Ears, Seasonal Allergies, Sinus Pain, Sore Throat, Visual Disturbances, Wears glasses/contact lenses and Yellow Eyes. Respiratory Not Present- Bloody sputum, Chronic Cough, Difficulty Breathing, Snoring and Wheezing. Breast Not Present- Breast Mass, Breast Pain, Nipple Discharge and Skin Changes. Cardiovascular Present- Difficulty Breathing Lying Down. Not Present- Chest Pain, Leg Cramps, Palpitations, Rapid Heart Rate, Shortness of Breath and Swelling of Extremities. Gastrointestinal Present- Abdominal Pain, Bloating, Excessive gas, Indigestion, Nausea and Vomiting. Not Present- Bloody Stool, Change in Bowel Habits, Chronic diarrhea, Constipation, Difficulty Swallowing, Gets full quickly at meals, Hemorrhoids and Rectal Pain. Female Genitourinary Not Present- Frequency, Nocturia, Painful Urination, Pelvic Pain and Urgency. Musculoskeletal Present- Back Pain. Not Present- Joint Pain, Joint Stiffness, Muscle Pain, Muscle Weakness and Swelling of Extremities. Psychiatric Not Present- Anxiety, Bipolar, Change in Sleep Pattern, Depression, Fearful and Frequent crying. Hematology Not Present- Blood Thinners, Easy Bruising, Excessive bleeding, Gland problems, HIV and Persistent Infections.  Vitals (Kayla [name] [name]; 12/08/2020 11:16 AM) 12/08/2020 11:15 AM Weight: 189.25 lb Height: 68in Body Surface Area: 2 m Body Mass Index: 28.78 kg/m  Temp.: 97.43F   Pulse: 97 (Regular)  P.OX: 99% (Room air) BP: 124/72(Sitting, Left Arm, Standard)       Physical Exam (Macil Crady L. [name] MD; 12/08/2020  11:52 AM) The physical exam findings are as follows: Note: Constitutional: No acute distress; conversant; no deformities Neuro: alert and oriented; cranial nerves grossly in tact; no focal deficits Eyes: Moist conjunctiva; anicteric sclerae; extraocular movements in tact Neck: Trachea midline Lungs: Normal respiratory effort; lungs clear to auscultation bilaterally; symmetric chest wall expansion CV: no pitting edema GI: Abdomen soft, nontender, nondistended; no masses or organomegaly MSK: Normal gait and station; no clubbing/cyanosis Psychiatric: Appropriate affect; alert and oriented 3    Assessment & Plan (Kikue Gerhart L. [name] MD; 12/08/2020 11:54 AM) CHOLELITHIASIS (K80.20) Story: 25 yo female with symptomatic cholelithiasis. Recently seen in ED after likely passing a stone via the common bile duct. I reviewed her imaging and recommend laparoscopic cholecystectomy to treat symptoms of biliary colic and to minimize risk of recurrent choledocholithiasis and associated complications. I discussed the details of the surgery with the patient and with her mother who was present via phone. I discussed the risks of bleeding, infection and 0.5% risk of common bile duct injury. 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 She agrees to proceed and will be contacted to schedule a surgery date. All questions answered.  [name], MD [facility] Surgery General, Hepatobiliary and Pancreatic Surgery 12/08/20 11:55 AM

## 2021-01-03 ENCOUNTER — Other Ambulatory Visit (HOSPITAL_COMMUNITY): Payer: Medicaid Other

## 2021-01-06 ENCOUNTER — Ambulatory Visit: Admit: 2021-01-06 | Payer: Medicaid Other | Admitting: Surgery

## 2021-01-06 SURGERY — LAPAROSCOPIC CHOLECYSTECTOMY
Anesthesia: General

## 2021-01-10 ENCOUNTER — Ambulatory Visit: Payer: Self-pay | Admitting: *Deleted

## 2021-01-10 NOTE — Telephone Encounter (Signed)
[redacted] weeks pregnant caller with sharp constant pain behind the right eye started around 8am this morning. Denies HA,congestion. Dizziness began with sitting up this morning with seeing black spots just before the eye pain When she lies down the dizziness resolves after a few minutes, denies CP. Lower right sided constant sharp pain since earlier. Light pink spotting for one month that OBGYN said was normal. LBM yesterday. Advised calling PCP/OBGYN now. If no appointment availability this morning go to UC/ED. Verbalized understanding.

## 2021-01-13 ENCOUNTER — Inpatient Hospital Stay (HOSPITAL_COMMUNITY): Payer: Medicaid Other

## 2021-01-13 ENCOUNTER — Inpatient Hospital Stay (HOSPITAL_COMMUNITY)
Admission: AD | Admit: 2021-01-13 | Discharge: 2021-01-13 | Disposition: A | Discharge status: Home / Self Care | Payer: Medicaid Other | Attending: Obstetrics & Gynecology | Admitting: Obstetrics & Gynecology

## 2021-01-13 ENCOUNTER — Other Ambulatory Visit: Payer: Self-pay

## 2021-01-13 ENCOUNTER — Encounter (HOSPITAL_COMMUNITY): Payer: Self-pay

## 2021-01-13 ENCOUNTER — Encounter (HOSPITAL_COMMUNITY): Payer: Self-pay | Admitting: Obstetrics and Gynecology

## 2021-01-13 ENCOUNTER — Inpatient Hospital Stay (HOSPITAL_COMMUNITY)
Admission: AD | Admit: 2021-01-13 | Discharge: 2021-01-13 | Disposition: A | Discharge status: Home / Self Care | Payer: Medicaid Other | Source: Home / Self Care | Attending: Obstetrics and Gynecology | Admitting: Obstetrics and Gynecology

## 2021-01-13 DIAGNOSIS — O99611 Diseases of the digestive system complicating pregnancy, first trimester: Secondary | ICD-10-CM | POA: Insufficient documentation

## 2021-01-13 DIAGNOSIS — Z3A01 Less than 8 weeks gestation of pregnancy: Secondary | ICD-10-CM | POA: Insufficient documentation

## 2021-01-13 DIAGNOSIS — K8050 Calculus of bile duct without cholangitis or cholecystitis without obstruction: Secondary | ICD-10-CM | POA: Insufficient documentation

## 2021-01-13 DIAGNOSIS — K8010 Calculus of gallbladder with chronic cholecystitis without obstruction: Secondary | ICD-10-CM | POA: Insufficient documentation

## 2021-01-13 DIAGNOSIS — K760 Fatty (change of) liver, not elsewhere classified: Secondary | ICD-10-CM | POA: Insufficient documentation

## 2021-01-13 DIAGNOSIS — O26611 Liver and biliary tract disorders in pregnancy, first trimester: Secondary | ICD-10-CM | POA: Insufficient documentation

## 2021-01-13 DIAGNOSIS — R109 Unspecified abdominal pain: Secondary | ICD-10-CM | POA: Insufficient documentation

## 2021-01-13 DIAGNOSIS — K8020 Calculus of gallbladder without cholecystitis without obstruction: Secondary | ICD-10-CM | POA: Diagnosis not present

## 2021-01-13 DIAGNOSIS — Z3490 Encounter for supervision of normal pregnancy, unspecified, unspecified trimester: Secondary | ICD-10-CM

## 2021-01-13 DIAGNOSIS — O26891 Other specified pregnancy related conditions, first trimester: Secondary | ICD-10-CM | POA: Insufficient documentation

## 2021-01-13 DIAGNOSIS — K819 Cholecystitis, unspecified: Secondary | ICD-10-CM

## 2021-01-13 LAB — COMPREHENSIVE METABOLIC PANEL
ALT: 17 U/L (ref 0–44)
ALT: 23 U/L (ref 0–44)
AST: 32 U/L (ref 15–41)
AST: 30 U/L (ref 15–41)
Albumin: 3.8 g/dL (ref 3.5–5.0)
Albumin: 3.9 g/dL (ref 3.5–5.0)
Alkaline Phosphatase: 46 U/L (ref 38–126)
Alkaline Phosphatase: 53 U/L (ref 38–126)
Anion gap: 9 (ref 5–15)
Anion gap: 9 (ref 5–15)
BUN: 11 mg/dL (ref 6–20)
BUN: 9 mg/dL (ref 6–20)
CO2: 24 mmol/L (ref 22–32)
CO2: 24 mmol/L (ref 22–32)
Calcium: 9.4 mg/dL (ref 8.9–10.3)
Calcium: 9.4 mg/dL (ref 8.9–10.3)
Chloride: 103 mmol/L (ref 98–111)
Chloride: 102 mmol/L (ref 98–111)
Creatinine, Ser: 0.72 mg/dL (ref 0.44–1.00)
Creatinine, Ser: 0.74 mg/dL (ref 0.44–1.00)
GFR, Estimated: >60 mL/min (ref >60)
GFR, Estimated: >60 mL/min (ref >60)
Glucose, Bld: 118 mg/dL — ABNORMAL HIGH (ref 70–99)
Glucose, Bld: 94 mg/dL (ref 70–99)
Potassium: 3.9 mmol/L (ref 3.5–5.1)
Potassium: 3.7 mmol/L (ref 3.5–5.1)
Sodium: 136 mmol/L (ref 135–145)
Sodium: 135 mmol/L (ref 135–145)
Total Bilirubin: 0.6 mg/dL (ref 0.3–1.2)
Total Bilirubin: 0.9 mg/dL (ref 0.3–1.2)
Total Protein: 7.1 g/dL (ref 6.5–8.1)
Total Protein: 7.4 g/dL (ref 6.5–8.1)

## 2021-01-13 LAB — URINALYSIS, ROUTINE W REFLEX MICROSCOPIC
Bilirubin Urine: NEGATIVE
Glucose, UA: NEGATIVE mg/dL
Hgb urine dipstick: NEGATIVE
Ketones, ur: 20 mg/dL — AB
Nitrite: NEGATIVE
Protein, ur: NEGATIVE mg/dL
Specific Gravity, Urine: 1.025 (ref 1.005–1.030)
pH: 6 (ref 5.0–8.0)

## 2021-01-13 LAB — LIPASE, BLOOD
Lipase: 39 U/L (ref 11–51)
Lipase: 38 U/L (ref 11–51)

## 2021-01-13 LAB — CBC WITH DIFFERENTIAL/PLATELET
Abs Immature Granulocytes: 0.04 10*3/uL (ref 0.00–0.07)
Basophils Absolute: 0 10*3/uL (ref 0.0–0.1)
Basophils Relative: 0 %
Eosinophils Absolute: 0.1 10*3/uL (ref 0.0–0.5)
Eosinophils Relative: 1 %
HCT: 41.3 % (ref 36.0–46.0)
Hemoglobin: 13.5 g/dL (ref 12.0–15.0)
Immature Granulocytes: 0 %
Lymphocytes Relative: 14 %
Lymphs Abs: 1.7 10*3/uL (ref 0.7–4.0)
MCH: 28.5 pg (ref 26.0–34.0)
MCHC: 32.7 g/dL (ref 30.0–36.0)
MCV: 87.1 fL (ref 80.0–100.0)
Monocytes Absolute: 0.5 10*3/uL (ref 0.1–1.0)
Monocytes Relative: 4 %
Neutro Abs: 10.2 10*3/uL — ABNORMAL HIGH (ref 1.7–7.7)
Neutrophils Relative %: 81 %
Platelets: 325 10*3/uL (ref 150–400)
RBC: 4.74 MIL/uL (ref 3.87–5.11)
RDW: 14.2 % (ref 11.5–15.5)
WBC: 12.6 10*3/uL — ABNORMAL HIGH (ref 4.0–10.5)
nRBC: 0 % (ref 0.0–0.2)

## 2021-01-13 LAB — POCT PREGNANCY, URINE: Preg Test, Ur: POSITIVE — AB

## 2021-01-13 LAB — HCG, QUANTITATIVE, PREGNANCY: hCG, Beta Chain, Quant, S: 5261 m[IU]/mL — ABNORMAL HIGH (ref <5)

## 2021-01-13 LAB — TYPE AND SCREEN
ABO/RH(D): A POS
Antibody Screen: NEGATIVE

## 2021-01-13 LAB — CBC
HCT: 40 % (ref 36.0–46.0)
Hemoglobin: 12.9 g/dL (ref 12.0–15.0)
MCH: 28.1 pg (ref 26.0–34.0)
MCHC: 32.3 g/dL (ref 30.0–36.0)
MCV: 87.1 fL (ref 80.0–100.0)
Platelets: 343 10*3/uL (ref 150–400)
RBC: 4.59 MIL/uL (ref 3.87–5.11)
RDW: 14.3 % (ref 11.5–15.5)
WBC: 13.6 10*3/uL — ABNORMAL HIGH (ref 4.0–10.5)
nRBC: 0 % (ref 0.0–0.2)

## 2021-01-13 LAB — AMYLASE
Amylase: 55 U/L (ref 28–100)
Amylase: 57 U/L (ref 28–100)

## 2021-01-13 MED ORDER — HYDROMORPHONE HCL 1 MG/ML IJ SOLN
1.0000 mg | Freq: Once | INTRAMUSCULAR | Status: AC
  Administered 2021-01-13: 1 mg via INTRAVENOUS
  Filled 2021-01-13: qty 1

## 2021-01-13 MED ORDER — OXYCODONE-ACETAMINOPHEN 5-325 MG PO TABS: 1.0000 | ORAL_TABLET | Freq: Four times a day (QID) | ORAL | 0 refills | Status: DC | PRN

## 2021-01-13 MED ORDER — SODIUM CHLORIDE 0.9 % IV SOLN
12.5000 mg | Freq: Once | INTRAVENOUS | Status: AC
  Administered 2021-01-13: 12.5 mg via INTRAVENOUS
  Filled 2021-01-13: qty 0.5

## 2021-01-13 MED ORDER — HYDROMORPHONE HCL 1 MG/ML IJ SOLN
0.5000 mg | Freq: Once | INTRAMUSCULAR | Status: AC
  Administered 2021-01-13: 0.5 mg via INTRAVENOUS
  Filled 2021-01-13: qty 1

## 2021-01-13 MED ORDER — HYDROMORPHONE HCL 1 MG/ML IJ SOLN: 1.0000 mg | Freq: Once | INTRAMUSCULAR | Status: DC

## 2021-01-13 MED ORDER — ONDANSETRON 4 MG PO TBDP
4.0000 mg | ORAL_TABLET | Freq: Once | ORAL | Status: AC
  Administered 2021-01-13: 4 mg via ORAL
  Filled 2021-01-13: qty 1

## 2021-01-13 MED ORDER — ONDANSETRON 4 MG PO TBDP: 4.0000 mg | ORAL_TABLET | Freq: Four times a day (QID) | ORAL | 0 refills | Status: DC | PRN

## 2021-01-13 MED ORDER — HYDROMORPHONE HCL 1 MG/ML IJ SOLN
0.5000 mg | Freq: Once | INTRAMUSCULAR | Status: DC
Start: 2021-01-13 — End: 2021-01-13

## 2021-01-13 MED ORDER — PROMETHAZINE HCL 25 MG PO TABS: 25.0000 mg | ORAL_TABLET | Freq: Four times a day (QID) | ORAL | 2 refills | Status: DC | PRN

## 2021-01-13 NOTE — MAU Provider Note (Addendum)
Chief Complaint: Abdominal Pain  Patient of [facility]   Event Date/Time   First Provider Initiated Contact with Patient 01/13/21 0357        SUBJECTIVE HPI: Kayla [name] is a 25 y.o. G2P1001 at [redacted]w[redacted]d by LMP who presents to maternity admissions reporting severe RUQ pain and nausea.  Started after eating tonight.  Has known multiple gallstones.  Was scheduled for cholecystectomy by Dr [name] who saw her last month, but she decided to try "natural plan" and use diet to control it. Recently had positive pregnancy tests at home after having a vaginal delivery 8 months ago. . She denies vaginal bleeding, vaginal itching/burning, urinary symptoms, h/a, dizziness, or fever/chills.    Abdominal Pain This is a recurrent problem. The current episode started today. The onset quality is sudden. The problem occurs constantly. The problem has been unchanged. The pain is located in the RUQ. The pain is severe. The quality of the pain is cramping, sharp and burning. The abdominal pain does not radiate. Associated symptoms include nausea. Pertinent negatives include no constipation, diarrhea, dysuria, fever, frequency, headaches or myalgias. The pain is aggravated by eating. The pain is relieved by nothing. She has tried nothing for the symptoms.   ED Note: Patient presents to ED with RUQ abdominal pain that started tonight associated with N, V. She reports known gall stones on previous US's. No fever. Currently pregnant, LMP 12/08/20. No vaginal bleeding.   Past Medical History:  Diagnosis Date   Anemia    pregnancy   Gallstones 10/2020   Headache    Past Surgical History:  Procedure Laterality Date   NO PAST SURGERIES     Social History   Socioeconomic History   Marital status: Single    Spouse name: Not on file   Number of children: Not on file   Years of education: Not on file   Highest education level: Not on file  Occupational History   Not on file  Tobacco Use   Smoking  status: Never Smoker   Smokeless tobacco: Never Used  Vaping Use   Vaping Use: Never used  Substance and Sexual Activity   Alcohol use: Not Currently   Drug use: Never   Sexual activity: Not Currently  Other Topics Concern   Not on file  Social History Narrative   Not on file   Social Determinants of Health   Financial Resource Strain: Not on file  Food Insecurity: Not on file  Transportation Needs: Not on file  Physical Activity: Not on file  Stress: Not on file  Social Connections: Not on file  Intimate Partner Violence: Not on file   No current facility-administered medications on file prior to encounter.   Current Outpatient Medications on File Prior to Encounter  Medication Sig Dispense Refill   acetaminophen (TYLENOL) 500 MG tablet Take 1,000 mg by mouth every 6 (six) hours as needed.     ibuprofen (ADVIL) 800 MG tablet Take 1 tablet (800 mg total) by mouth 3 (three) times daily. 30 tablet 0   Prenatal Vit-Fe Fumarate-FA (PRENATAL MULTIVITAMIN) TABS tablet Take 1 tablet by mouth daily at 12 noon.     Allergies  Allergen Reactions   Shrimp [Shellfish Allergy] Nausea And Vomiting and Rash    PT states she also gets a fever     I have reviewed patient's Past Medical Hx, Surgical Hx, Family Hx, Social Hx, medications and allergies.   ROS:  Review of Systems  Constitutional: Negative for fever.  Gastrointestinal: Positive for abdominal pain and nausea. Negative for constipation and diarrhea.  Genitourinary: Negative for dysuria and frequency.  Musculoskeletal: Negative for myalgias.  Neurological: Negative for headaches.   Review of Systems  Other systems negative   Physical Exam  Physical Exam Patient Vitals for the past 24 hrs:  BP Temp Temp src Pulse Resp SpO2  01/13/21 0354 121/69 98 F (36.7 C) -- 73 (!) 22 --  01/13/21 0336 100/61 99.7 F (37.6 C) Oral 71 18 99 %  01/13/21 0319 -- 98.3 F (36.8 C) Oral -- -- --   Constitutional: Well-developed,  well-nourished female in no acute distress.  Cardiovascular: normal rate and rhythm, no ectopy or murmur Respiratory: normal effort, CTAB GI: Abd soft, tender over RUQ with +Murphy sign.  MS: Extremities nontender, no edema, normal ROM Neurologic: Alert and oriented x 4.  GU: Neg CVAT.  PELVIC EXAM: deferred, no OB complaints  LAB RESULTS Results for orders placed or performed during the hospital encounter of 01/13/21 (from the past 24 hour(s))  CBC with Differential/Platelet     Status: Abnormal   Collection Time: 01/13/21  4:15 AM  Result Value Ref Range   WBC 12.6 (H) 4.0 - 10.5 K/uL   RBC 4.74 3.87 - 5.11 MIL/uL   Hemoglobin 13.5 12.0 - 15.0 g/dL   HCT 10.1 75.1 - 02.5 %   MCV 87.1 80.0 - 100.0 fL   MCH 28.5 26.0 - 34.0 pg   MCHC 32.7 30.0 - 36.0 g/dL   RDW 85.2 77.8 - 24.2 %   Platelets 325 150 - 400 K/uL   nRBC 0.0 0.0 - 0.2 %   Neutrophils Relative % 81 %   Neutro Abs 10.2 (H) 1.7 - 7.7 K/uL   Lymphocytes Relative 14 %   Lymphs Abs 1.7 0.7 - 4.0 K/uL   Monocytes Relative 4 %   Monocytes Absolute 0.5 0.1 - 1.0 K/uL   Eosinophils Relative 1 %   Eosinophils Absolute 0.1 0.0 - 0.5 K/uL   Basophils Relative 0 %   Basophils Absolute 0.0 0.0 - 0.1 K/uL   Immature Granulocytes 0 %   Abs Immature Granulocytes 0.04 0.00 - 0.07 K/uL  Amylase     Status: None   Collection Time: 01/13/21  4:15 AM  Result Value Ref Range   Amylase 55 28 - 100 U/L  Lipase, blood     Status: None   Collection Time: 01/13/21  4:15 AM  Result Value Ref Range   Lipase 39 11 - 51 U/L  Comprehensive metabolic panel     Status: Abnormal   Collection Time: 01/13/21  4:15 AM  Result Value Ref Range   Sodium 136 135 - 145 mmol/L   Potassium 3.9 3.5 - 5.1 mmol/L   Chloride 103 98 - 111 mmol/L   CO2 24 22 - 32 mmol/L   Glucose, Bld 118 (H) 70 - 99 mg/dL   BUN 11 6 - 20 mg/dL   Creatinine, Ser 3.53 0.44 - 1.00 mg/dL   Calcium 9.4 8.9 - 61.4 mg/dL   Total Protein 7.1 6.5 - 8.1 g/dL   Albumin 3.8  3.5 - 5.0 g/dL   AST 32 15 - 41 U/L   ALT 17 0 - 44 U/L   Alkaline Phosphatase 46 38 - 126 U/L   Total Bilirubin 0.6 0.3 - 1.2 mg/dL   GFR, Estimated >43 >15 mL/min   Anion gap 9 5 - 15  Urinalysis, Routine w reflex microscopic  Status: Abnormal   Collection Time: 01/13/21  4:15 AM  Result Value Ref Range   Color, Urine YELLOW YELLOW   APPearance HAZY (A) CLEAR   Specific Gravity, Urine 1.025 1.005 - 1.030   pH 6.0 5.0 - 8.0   Glucose, UA NEGATIVE NEGATIVE mg/dL   Hgb urine dipstick NEGATIVE NEGATIVE   Bilirubin Urine NEGATIVE NEGATIVE   Ketones, ur 20 (A) NEGATIVE mg/dL   Protein, ur NEGATIVE NEGATIVE mg/dL   Nitrite NEGATIVE NEGATIVE   Leukocytes,Ua TRACE (A) NEGATIVE   RBC / HPF 0-5 0 - 5 RBC/hpf   WBC, UA 0-5 0 - 5 WBC/hpf   Bacteria, UA RARE (A) NONE SEEN   Squamous Epithelial / LPF 6-10 0 - 5   Mucus PRESENT   hCG, quantitative, pregnancy     Status: Abnormal   Collection Time: 01/13/21  4:15 AM  Result Value Ref Range   hCG, Beta Chain, Quant, S 5,261 (H) <5 mIU/mL  Pregnancy, urine POC     Status: Abnormal   Collection Time: 01/13/21  4:29 AM  Result Value Ref Range   Preg Test, Ur POSITIVE (A) NEGATIVE    --/--/A POS   Performed at [facility], [address] [place], [place] [address]  [phone])  IMAGING US ABDOMEN LIMITED RUQ (LIVER/GB)  Result Date: 01/13/2021 CLINICAL DATA:  Known cholelithiasis. Increasing right upper quadrant abdominal pain. EXAM: ULTRASOUND ABDOMEN LIMITED RIGHT UPPER QUADRANT COMPARISON:  Ultrasound abdomen 12/06/2020, MR abdomen 12/06/2020 FINDINGS: Gallbladder: Multiple subcentimeter calcified gallstones within the gallbladder lumen. Borderline thickened gallbladder wall and suggestion of pericholecystic fluid. A sonographic Eulah Pont sign was not reported by the ultrasound technician as the patient is pre-medicated. Common bile duct: Diameter: Borderline enlarged measuring up to 8 mm (compared to 10 mm on prior ultrasound).  Liver: No focal lesion identified. Slightly increased parenchymal echogenicity. Portal vein is patent on color Doppler imaging with normal direction of blood flow towards the liver. Other: None. IMPRESSION: 1. Cholelithiasis with borderline thickened gallbladder wall (3 mm) and suggestion of trace pericholecystic fluid. A sonographic Eulah Pont sign was not reported by the ultrasound technician as the patient is pre-medicated. Findings could represent mild acute cholecystitis in the proper clinical setting. Recommend clinical correlation. 2. Borderline enlarged common bile duct (22mm; however, decreased from prior ultrasound) with no definite choledocholithiasis identified on ultrasound; however, the entire common bile duct is not visualized. Consider MRCP/ERCP if clinically indicated. 3. Hepatic steatosis. Electronically Signed   By: Tish Frederickson M.D.   On: 01/13/2021 05:20     MAU Management/MDM: Ordered CBC, CMET, Amylase and Lipase as well as a RUQ [place] Results reviewed with Dr Charlotta Newton Transaminases are back to normal, mild leukocytosis [place] c/w prior exams with CBD slightly smaller Pain is now "0" per pt Message left on [facility] phone, and note will be sent Message sent to Dr [name] and pt will call [facility] office today to arrange for appointment to discuss future episodes Strict return precautions  ASSESSMENT 1. Cholelithiases   2.      Cholecystitis vs colic 3.      Pregnancy at [redacted]w[redacted]d by LMP  PLAN Discharge home Rx Phenergan for nausea Pt to followup with [facility] and [facility]  Encouraged to return here if she develops worsening of symptoms, increase in pain, fever, or other concerning symptoms.    Wynelle Bourgeois CNM, MSN Certified Nurse-Midwife 01/13/2021  4:11 AM  Attestation of Attending Supervision of Advanced Practice Provider (PA/CNM/NP): Evaluation and management procedures were performed  by the Advanced Practice Provider under my supervision and collaboration.  I have reviewed the Advanced Practice  Provider's note and chart, and I agree with the management and plan. I have also made any necessary editorial changes.   Sharon SellerJennifer M Somalia Segler, DO Attending Obstetrician & Gynecologist, [facility] for [facility], [facility] 01/13/2021 2:04 PM

## 2021-01-13 NOTE — MAU Note (Signed)
PT transferred to MAU from [facility] ED. Having upper abd pain that radiates to back. States had this pain in Jan and was told had gallstones but "passed" the stone. Wanted to go the "healthy" route and eat low fat to avoid surgery. Has done well until last night ate pasta and feels that set her off. Pain feels like when she had gallbladder attack in Jan

## 2021-01-13 NOTE — Discharge Instructions (Signed)
Cholelithiasis  Cholelithiasis is a disease in which gallstones form in the gallbladder. The gallbladder is an organ that stores bile. Bile is a fluid that helps to digest fats. Gallstones begin as small crystals and can slowly grow into stones. They may cause no symptoms until they block the gallbladder duct, or cystic duct, when the gallbladder tightens (contracts) after food is eaten. This can cause pain and is known as a gallbladder attack, or biliary colic. There are two main types of gallstones:  Cholesterol stones. These are the most common type of gallstone. These stones are made of hardened cholesterol and are usually yellow-green in color. Cholesterol is a fat-like substance that is made in the liver.  Pigment stones. These are dark in color and are made of a red-yellow substance, called bilirubin,that forms when hemoglobin from red blood cells breaks down. What are the causes? This condition may be caused by an imbalance in the different parts that make bile. This can happen if the bile:  Has too much bilirubin. This can happen in certain blood diseases, such as sickle cell anemia.  Has too much cholesterol.  Does not have enough bile salts. These salts help the body absorb and digest fats. In some cases, this condition can also be caused by the gallbladder not emptying completely or often enough. This is common during pregnancy. What increases the risk? The following factors may make you more likely to develop this condition:  Being female.  Having multiple pregnancies. Health care providers sometimes advise removing diseased gallbladders before future pregnancies.  Eating a diet that is heavy in fried foods, fat, and refined carbohydrates, such as white bread and white rice.  Being obese.  Being older than age 40.  Using medicines that contain female hormones (estrogen) for a long time.  Losing weight quickly.  Having a family history of gallstones.  Having certain  medical problems, such as: ? Diabetes mellitus. ? Cystic fibrosis. ? Crohn's disease. ? Cirrhosis or other long-term (chronic) liver disease. ? Certain blood diseases, such as sickle cell anemia or leukemia. What are the signs or symptoms? In many cases, having gallstones causes no symptoms. When you have gallstones but do not have symptoms, you have silent gallstones. If a gallstone blocks your bile duct, it can cause a gallbladder attack. The main symptom of a gallbladder attack is sudden pain in the upper right part of the abdomen. The pain:  Usually comes at night or after eating.  Can last for one hour or more.  Can spread to your right shoulder, back, or chest.  Can feel like indigestion. This is discomfort, burning, or fullness in your upper abdomen. If the bile duct is blocked for more than a few hours, it can cause an infection or inflammation of your gallbladder (cholecystitis), liver, or pancreas. This can cause:  Nausea or vomiting.  Bloating.  Pain in your abdomen that lasts for 5 hours or longer.  Tenderness in your upper abdomen, often in the upper right section and under your rib cage.  Fever or chills.  Skin or the white parts of your eyes turning yellow (jaundice). This usually happens when a stone has blocked bile from passing through the common bile duct.  Dark urine or light-colored stools. How is this diagnosed? This condition may be diagnosed based on:  A physical exam.  Your medical history.  Ultrasound.  CT scan.  MRI. You may also have other tests, including:  Blood tests to check for signs of an   infection or inflammation.  Cholescintigraphy, or HIDA scan. This is a scan of your gallbladder and bile ducts (biliary system) using non-harmful radioactive material and special cameras that can see the radioactive material.  Endoscopic retrograde cholangiopancreatogram. This involves inserting a small tube with a camera on the end (endoscope)  through your mouth to look at bile ducts and check for blockages. How is this treated? Treatment for this condition depends on the severity of the condition. Silent gallstones do not need treatment. Treatment may be needed if a blockage causes a gallbladder attack or other symptoms. Treatment may include:  Home care, if symptoms are not severe. ? During a simple gallbladder attack, stop eating and drinking for 12-24 hours (except for water and clear liquids). This helps to "cool down" your gallbladder. After 1 or 2 days, you can start to eat a diet of simple or clear foods, such as broths and crackers. ? You may also need medicines for pain or nausea or both. ? If you have cholecystitis and an infection, you will need antibiotics.  A hospital stay, if needed for pain control or for cholecystitis with severe infection.  Cholecystectomy, or surgery to remove your gallbladder. This is the most common treatment if all other treatments have not worked.  Medicines to break up gallstones. These are most effective at treating small gallstones. Medicines may be used for up to 6-12 months.  Endoscopic retrograde cholangiopancreatogram. A small basket can be attached to the endoscope and used to capture and remove gallstones, mainly those that are in the common bile duct. Follow these instructions at home: Medicines  Take over-the-counter and prescription medicines only as told by your health care provider.  If you were prescribed an antibiotic medicine, take it as told by your health care provider. Do not stop taking the antibiotic even if you start to feel better.  Ask your health care provider if the medicine prescribed to you requires you to avoid driving or using machinery. Eating and drinking  Drink enough fluid to keep your urine pale yellow. This is important during a gallbladder attack. Water and clear liquids are preferred.  Follow a healthy diet. This includes: ? Reducing fatty foods,  such as fried food and foods high in cholesterol. ? Reducing refined carbohydrates, such as white bread and white rice. ? Eating more fiber. Aim for foods such as almonds, fruit, and beans. Alcohol use  If you drink alcohol: ? Limit how much you use to:  0-1 drink a day for nonpregnant women.  0-2 drinks a day for men. ? Be aware of how much alcohol is in your drink. In the [place] one drink equals one 12 oz bottle of beer (355 mL), one 5 oz glass of wine (148 mL), or one 1 oz glass of hard liquor (44 mL). General instructions  Do not use any products that contain nicotine or tobacco, such as cigarettes, e-cigarettes, and chewing tobacco. If you need help quitting, ask your health care provider.  Maintain a healthy weight.  Keep all follow-up visits as told by your health care provider. These may include consultations with a surgeon or specialist. This is important. Where to find more information  National Institute of Diabetes and Digestive and Kidney Diseases: www.niddk.nih.gov Contact a health care provider if:  You think you have had a gallbladder attack.  You have been diagnosed with silent gallstones and you develop pain in your abdomen or indigestion.  You begin to have attacks more often.  You have   dark urine or light-colored stools. Get help right away if:  You have pain from a gallbladder attack that lasts for more than 2 hours.  You have pain in your abdomen that lasts for more than 5 hours or is getting worse.  You have a fever or chills.  You have nausea and vomiting that do not go away.  You develop jaundice. Summary  Cholelithiasis is a disease in which gallstones form in the gallbladder.  This condition may be caused by an imbalance in the different parts that make bile. This can happen if your bile has too much bilirubin or cholesterol, or does not have enough bile salts.  Treatment for gallstones depends on the severity of the condition. Silent  gallstones do not need treatment.  If gallstones cause a gallbladder attack or other symptoms, treatment usually involves not eating or drinking anything. Treatment may also include pain medicines and antibiotics, and it sometimes includes a hospital stay.  Surgery to remove the gallbladder is common if all other treatments have not worked. This information is not intended to replace advice given to you by your health care provider. Make sure you discuss any questions you have with your health care provider. Document Revised: 09/08/2019 Document Reviewed: 09/08/2019 Elsevier Patient Education  2021 [employer].  

## 2021-01-13 NOTE — ED Triage Notes (Signed)
Pt arrives POV w/ c/o abd pain. MAU called due to patient stating she is preg [facility] 2/09.

## 2021-01-13 NOTE — MAU Provider Note (Signed)
Chief Complaint: Abdominal Pain   Event Date/Time   First Provider Initiated Contact with Patient 01/13/21 2021        SUBJECTIVE HPI: Kayla [name] is a 25 y.o. G2P1001 at [redacted]w[redacted]d by LMP who presents to maternity admissions reporting recurrence of RUQ pain.  She was seen here and discharged this morning after pain improved.  Has known cholelithiasis with large gallbladder and multiple stones.  Was set up to have cholecystectomy last month, but her mother talked her into cancelling and "just doing diet".  Then she got pregnant soon thereafter (planned).  Had a baby 8 months ago. . She denies vaginal bleeding, vaginal itching/burning, urinary symptoms, h/a, dizziness,  or fever/chills.    Abdominal Pain This is a recurrent problem. The current episode started today. The problem occurs intermittently. The problem has been unchanged. The pain is located in the RUQ. The quality of the pain is cramping and colicky. The abdominal pain radiates to the back. Associated symptoms include nausea and vomiting. Pertinent negatives include no constipation, diarrhea, dysuria, fever, frequency, headaches or myalgias. The pain is aggravated by palpation. The pain is relieved by nothing. She has tried nothing for the symptoms.   RN Note: Kayla [name] is a 25 y.o. at [redacted]w[redacted]d here in MAU reporting: pt states she was here in the middle of the night and was told to come back if pain gets worse. Pain has gotten worse over the past couple of hours. Hx of gall bladder disease. Onset of complaint: today           Pain score: 9/10  Past Medical History:  Diagnosis Date  . Anemia    pregnancy  . Gallstones 10/2020  . Headache    Past Surgical History:  Procedure Laterality Date  . NO PAST SURGERIES     Social History   Socioeconomic History  . Marital status: Single    Spouse name: Not on file  . Number of children: Not on file  . Years of education: Not on file  . Highest education level: Not on  file  Occupational History  . Not on file  Tobacco Use  . Smoking status: Never Smoker  . Smokeless tobacco: Never Used  Vaping Use  . Vaping Use: Never used  Substance and Sexual Activity  . Alcohol use: Not Currently  . Drug use: Never  . Sexual activity: Not Currently  Other Topics Concern  . Not on file  Social History Narrative  . Not on file   Social Determinants of Health   Financial Resource Strain: Not on file  Food Insecurity: Not on file  Transportation Needs: Not on file  Physical Activity: Not on file  Stress: Not on file  Social Connections: Not on file  Intimate Partner Violence: Not on file   No current facility-administered medications on file prior to encounter.   Current Outpatient Medications on File Prior to Encounter  Medication Sig Dispense Refill  . acetaminophen (TYLENOL) 500 MG tablet Take 1,000 mg by mouth every 6 (six) hours as needed.    . Prenatal Vit-Fe Fumarate-FA (PRENATAL MULTIVITAMIN) TABS tablet Take 1 tablet by mouth daily at 12 noon.    . promethazine (PHENERGAN) 25 MG tablet Take 1 tablet (25 mg total) by mouth every 6 (six) hours as needed for nausea or vomiting. 30 tablet 2   Allergies  Allergen Reactions  . Shrimp [Shellfish Allergy] Nausea And Vomiting and Rash    PT states she also gets a fever  I have reviewed patient's Past Medical Hx, Surgical Hx, Family Hx, Social Hx, medications and allergies.   ROS:  Review of Systems  Constitutional: Negative for fever.  Gastrointestinal: Positive for abdominal pain, nausea and vomiting. Negative for constipation and diarrhea.  Genitourinary: Negative for dysuria and frequency.  Musculoskeletal: Negative for myalgias.  Neurological: Negative for headaches.   Review of Systems  Other systems negative   Physical Exam  Physical Exam Patient Vitals for the past 24 hrs:  BP Temp Pulse Resp SpO2  01/13/21 1945 - - 80 19 100 %  01/13/21 1854 128/77 98 F (36.7 C) 82 (!) 32 100  %   Constitutional: Well-developed, well-nourished female in no acute distress. Actively dry heaving Cardiovascular: normal rate Respiratory: normal effort GI: Abd soft, tender over RUQ. Mild guarding, no rebound MS: Extremities nontender, no edema, normal ROM Neurologic: Alert and oriented x 4.  GU: Neg CVAT.  PELVIC EXAM: deferred  LAB RESULTS Results for orders placed or performed during the hospital encounter of 01/13/21 (from the past 24 hour(s))  CBC     Status: Abnormal   Collection Time: 01/13/21  8:21 PM  Result Value Ref Range   WBC 13.6 (H) 4.0 - 10.5 K/uL   RBC 4.59 3.87 - 5.11 MIL/uL   Hemoglobin 12.9 12.0 - 15.0 g/dL   HCT 13.2 44.0 - 10.2 %   MCV 87.1 80.0 - 100.0 fL   MCH 28.1 26.0 - 34.0 pg   MCHC 32.3 30.0 - 36.0 g/dL   RDW 72.5 36.6 - 44.0 %   Platelets 343 150 - 400 K/uL   nRBC 0.0 0.0 - 0.2 %  Comprehensive metabolic panel     Status: None   Collection Time: 01/13/21  8:21 PM  Result Value Ref Range   Sodium 135 135 - 145 mmol/L   Potassium 3.7 3.5 - 5.1 mmol/L   Chloride 102 98 - 111 mmol/L   CO2 24 22 - 32 mmol/L   Glucose, Bld 94 70 - 99 mg/dL   BUN 9 6 - 20 mg/dL   Creatinine, Ser 3.47 0.44 - 1.00 mg/dL   Calcium 9.4 8.9 - 42.5 mg/dL   Total Protein 7.4 6.5 - 8.1 g/dL   Albumin 3.9 3.5 - 5.0 g/dL   AST 30 15 - 41 U/L   ALT 23 0 - 44 U/L   Alkaline Phosphatase 53 38 - 126 U/L   Total Bilirubin 0.9 0.3 - 1.2 mg/dL   GFR, Estimated >95 >63 mL/min   Anion gap 9 5 - 15  Type and screen     Status: None   Collection Time: 01/13/21  8:21 PM  Result Value Ref Range   ABO/RH(D) A POS    Antibody Screen NEG    Sample Expiration      01/16/2021,2359 Performed at [facility], [address], [place], [place] [address]   Lipase, blood     Status: None   Collection Time: 01/13/21  8:21 PM  Result Value Ref Range   Lipase 38 11 - 51 U/L  Amylase     Status: None   Collection Time: 01/13/21  8:21 PM  Result Value Ref Range   Amylase 57 28  - 100 U/L    --/--/A POS (03/17 2021)  IMAGING US ABDOMEN LIMITED RUQ (LIVER/GB)  Result Date: 01/13/2021 CLINICAL DATA:  Right upper quadrant pain. EXAM: ULTRASOUND ABDOMEN LIMITED RIGHT UPPER QUADRANT COMPARISON:  January 13, 2021 (4:37 a.m.) FINDINGS: Gallbladder: Innumerable shadowing,  echogenic gallstones are seen within a distended gallbladder (11.9 cm x 3.7 cm x 4.3 cm). The gallbladder wall measures 2.8 mm in thickness. No sonographic Murphy sign noted by sonographer. Common bile duct: Diameter: The common bile duct is dilated and measures 10.2 mm. Liver: No focal lesion identified. Within normal limits in parenchymal echogenicity. Portal vein is patent on color Doppler imaging with normal direction of blood flow towards the liver. Other: None. IMPRESSION: 1. Cholelithiasis and common bile duct dilatation in the absence of a positive sonographic Murphy's sign. These findings are noted on the prior study and could represent early acute cholecystitis in the proper clinical setting. Electronically Signed   By: Aram Candela M.D.   On: 01/13/2021 21:31   US ABDOMEN LIMITED RUQ (LIVER/GB)  Result Date: 01/13/2021 CLINICAL DATA:  Known cholelithiasis. Increasing right upper quadrant abdominal pain. EXAM: ULTRASOUND ABDOMEN LIMITED RIGHT UPPER QUADRANT COMPARISON:  Ultrasound abdomen 12/06/2020, MR abdomen 12/06/2020 FINDINGS: Gallbladder: Multiple subcentimeter calcified gallstones within the gallbladder lumen. Borderline thickened gallbladder wall and suggestion of pericholecystic fluid. A sonographic Eulah Pont sign was not reported by the ultrasound technician as the patient is pre-medicated. Common bile duct: Diameter: Borderline enlarged measuring up to 8 mm (compared to 10 mm on prior ultrasound). Liver: No focal lesion identified. Slightly increased parenchymal echogenicity. Portal vein is patent on color Doppler imaging with normal direction of blood flow towards the liver. Other: None.  IMPRESSION: 1. Cholelithiasis with borderline thickened gallbladder wall (3 mm) and suggestion of trace pericholecystic fluid. A sonographic Eulah Pont sign was not reported by the ultrasound technician as the patient is pre-medicated. Findings could represent mild acute cholecystitis in the proper clinical setting. Recommend clinical correlation. 2. Borderline enlarged common bile duct (32mm; however, decreased from prior ultrasound) with no definite choledocholithiasis identified on ultrasound; however, the entire common bile duct is not visualized. Consider MRCP/ERCP if clinically indicated. 3. Hepatic steatosis. Electronically Signed   By: Tish Frederickson M.D.   On: 01/13/2021 05:20    MAU Management/MDM: Ordered repeat labs to compare to prior results >> these remain stable.  Mild leukocytosis, but no elevation in transaminases., lipase or amylase [place] repeated, not much change from 12 hours ago.   Medicated for nausea and pain, good relief Consulted Dr Vergie Living who recommends phone consult with surgeon Consulted Dr Janee Morn who recommends supportive care and agrees with office re-consult as scheduled with Dr [name]  ASSESSMENT 1. Cholecystitis   2. Pregnancy   3.      Probable biliary colic 4.      Cholelithiasis with dilated CBD  PLAN Discharge home DIscussed expectant management, low fat diet and followup with OB and Surgery appts Rx Zofran and Percocet prn Followup as previously discussed Pt stable at time of discharge. Encouraged to return here if she develops worsening of symptoms, increase in pain, fever, or other concerning symptoms.    Wynelle Bourgeois CNM, MSN Certified Nurse-Midwife 01/13/2021  10:04 PM

## 2021-01-13 NOTE — Discharge Instructions (Signed)
Biliary Colic, Adult  Biliary colic is severe pain caused by a problem with the gallbladder. The gallbladder is a small organ in the upper right part of the abdomen. The gallbladder stores a digestive fluid produced in the liver (bile) that helps the body break down fat. Bile and other digestive enzymes are carried from the liver to the small intestine through tube-like structures called bile ducts. The gallbladder and the bile ducts form the biliary tract. Sometimes, hard deposits of digestive fluids (gallstones) form in the gallbladder and block the flow of bile from the gallbladder, causing biliary colic. This condition is also called a gallbladder attack. Gallstones can be as small as a grain of sand or as big as a golf ball. There could be just one gallstone in the gallbladder, or there could be many. What are the causes? This condition is usually caused by gallstones. Less often, a tumor could block the flow of bile from the gallbladder and trigger biliary colic. What increases the risk? The following factors may make you more likely to develop this condition:  Being female.  Having a family history of gallstones.  Being obese.  Losing weight suddenly or quickly.  Eating a diet that is high in calories, low in fiber, and rich in refined carbohydrates, such as white bread and white rice.  Having certain health conditions, such as: ? An intestinal disease that affects nutrient absorption, such as Crohn's disease. ? A metabolic condition, such as diabetes or metabolic syndrome. Metabolic syndrome occurs when someone has high blood pressure, high cholesterol, and diabetes. ? A blood condition, such as hemolytic anemia or sickle cell disease. What are the signs or symptoms? The main symptom of this condition is severe pain in the upper right side of the abdomen. You may feel this pain below the chest but above the hip. This pain often occurs at night or after eating a meal that is high in  fat. This pain may get worse for up to an hour and last as long as 12 hours. In most cases, the pain fades (subsides) within 2 hours. Other symptoms of this condition include:  Nausea and vomiting.  Pain under the right shoulder. How is this diagnosed? This condition is diagnosed based on your medical history, your symptoms, and a physical exam. You may also have tests, including:  Blood tests to rule out infection or inflammation of the bile ducts, gallbladder, pancreas, or liver.  Imaging studies, such as: ? An ultrasound. ? A CT scan. ? An MRI. In some cases, you may need to have an imaging study done using a small amount of radioactive material (nuclear medicine) to confirm the diagnosis. How is this treated? This condition may be treated with medicines to:  Relieve your pain or nausea.  Dissolve the gallstones. It may take months or years before the gallstones are completely gone. If you have gallstones, or if you have a tumor in the gallbladder that is causing biliary colic, you may need surgery to remove the gallbladder (cholecystectomy). Follow these instructions at home: Eating and drinking  Drink enough fluid to keep your urine pale yellow.  Follow instructions from your health care provider about eating or drinking restrictions. These may include avoiding: ? Fatty, greasy, and fried foods. ? Any foods that make the pain worse. ? Overeating. ? Having a large meal after not eating for a while. General instructions  Take over-the-counter and prescription medicines only as told by your health care provider.  Keep all  follow-up visits as told by your health care provider. This is important. How is this prevented? Steps to prevent this condition include:  Maintaining a healthy body weight.  Getting regular exercise.  Eating a healthy diet that is high in fiber and low in fat.  Limiting how much sugar and refined carbohydrates you eat. Contact a health care  provider if:  Your pain lasts more than 5 hours.  You vomit.  You have a fever and chills.  Your pain gets worse. Get help right away if:  Your skin or the whites of your eyes look yellow (jaundice).  Your have tea-colored urine and light-colored stools (feces).  You are dizzy or you faint. Summary  Biliary colic is severe pain caused by a problem with the gallbladder. The gallbladder is a small organ in the upper right part of your abdomen.  Treatment for this condition may include medicine to relieve your pain or nausea, or medicine to slowly dissolve the gallstones.  If you have gallstones, or if you have a tumor in the gallbladder that is causing biliary colic, you may need surgery to remove the gallbladder (cholecystectomy). This information is not intended to replace advice given to you by your health care provider. Make sure you discuss any questions you have with your health care provider. Document Revised: 10/28/2019 Document Reviewed: 08/19/2019 Elsevier Patient Education  2021 [employer] Inc[employer]   Cholecystitis  Cholecystitis is irritation and swelling (inflammation) of the gallbladder. The gallbladder is an organ that is shaped like a pear. It is under the liver on the right side of the body. This organ stores bile. Bile helps the body break down (digest) the fats in food. This condition can occur all of a sudden. It needs to be treated. What are the causes? This condition may be caused by stones or lumps that form in the gallbladder (gallstones). Gallstones can block the tube (duct) that carries bile out of your gallbladder. Other causes are:  Damage to the gallbladder due to less blood flow.  Germs in the bile ducts.  Scars or kinks in the bile ducts.  Abnormal growths (tumors) in the liver, pancreas, or gallbladder. What increases the risk? You are more likely to develop this condition if:  You have sickle cell disease.  You take birth control pills.  You  use estrogen.  You have alcoholic liver disease.  You have liver cirrhosis.  You are being fed through a vein.  You are very ill.  You do not eat or drink for a long time. This is also called "fasting."  You are overweight (obese).  You lose weight too fast.  You are pregnant.  You have high levels of fat in the blood (triglycerides).  You have irritation and swelling of the pancreas (pancreatitis). What are the signs or symptoms? Symptoms of this condition include:  Pain in the belly (abdomen). Pain is often in the upper right area of the belly.  Tenderness or bloating in the belly.  Feeling sick to your stomach (nauseous).  Throwing up (vomiting).  Fever.  Chills. How is this diagnosed? This condition may be diagnosed with a medical history and exam. You may also have other tests, such as:  Imaging tests. This may include: ? Ultrasound. ? CT scan of the belly. ? Nuclear scan. This is also called a HIDA scan. This scan lets your doctor see the bile as it moves in your body. ? MRI.  Blood tests. These are done to check: ? Your  blood count. The white blood cell count may be higher than normal. ? How well your liver works.   How is this treated? This condition may be treated with:  Surgery to take out your gallbladder.  Antibiotic medicines to treat illnesses caused by germs.  Going without food for some time.  Giving fluids through an IV tube.  Medicines to treat pain or throwing up. Follow these instructions at home:  If you had surgery, follow instructions from your doctor about how to care for yourself after you go home. Medicines  Take over-the-counter and prescription medicines only as told by your doctor.  If you were prescribed an antibiotic medicine, take it as told by your doctor. Do not stop taking it even if you start to feel better.   General instructions  Follow instructions from your doctor about what to eat or drink. Do not eat or  drink anything that makes you sick again.  Do not lift anything that is heavier than 10 lb (4.5 kg) until your doctor says that it is safe.  Do not use any products that contain nicotine or tobacco, such as cigarettes and e-cigarettes. If you need help quitting, ask your doctor.  Keep all follow-up visits as told by your doctor. This is important. Contact a doctor if:  You have pain and your medicine does not help.  You have a fever. Get help right away if:  Your pain moves to: ? Another part of your belly. ? Your back.  Your symptoms do not go away.  You have new symptoms. Summary  Cholecystitis is swelling and irritation of the gallbladder.  This condition may be caused by stones or lumps that form in the gallbladder (gallstones).  Common symptoms are pain in the belly. You may feel sick to your stomach and start throwing up. You may also have a fever and chills.  This condition may be treated with surgery to take out the gallbladder. It may also be treated with medicines, fasting, and fluids through an IV tube.  Follow what you are told about eating and drinking. Do not eat things that make you sick again. This information is not intended to replace advice given to you by your health care provider. Make sure you discuss any questions you have with your health care provider. Document Revised: 02/22/2018 Document Reviewed: 02/22/2018 Elsevier Patient Education  2021 [employer].

## 2021-01-13 NOTE — MAU Note (Signed)
Kayla [name] is a 25 y.o. at [redacted]w[redacted]d here in MAU reporting: pt states she was here in the middle of the night and was told to come back if pain gets worse. Pain has gotten worse over the past couple of hours. Hx of gall bladder disease.  Onset of complaint: today  Pain score: 9/10  Vitals:   01/13/21 1854  BP: 128/77  Pulse: 82  Resp: (!) 32  Temp: 98 F (36.7 C)  SpO2: 100%     Lab orders placed from triage: none

## 2021-01-13 NOTE — ED Provider Notes (Signed)
MSE was initiated and I personally evaluated the patient and placed orders (if any) at  3:44 AM on January 13, 2021.  Patient presents to ED with RUQ abdominal pain that started tonight associated with N, V. She reports known gall stones on previous US's. No fever. Currently pregnant, LMP 12/08/20. No vaginal bleeding.   Discussed with the MAU APP provider. She can be seen in this ED or in MAU. They will accept her because she is pregnant for work up of suspected cholecystitis and can be seen immediately. As there is a wait in the emergency department and the patient is in significant pain, it would be beneficial for her to transfer to MAU for needed work up. Appreciate the assistance of MAU staff in her care.   Today's Vitals   01/13/21 0319 01/13/21 0336  BP:  100/61  Pulse:  71  Resp:  18  Temp: 98.3 F (36.8 C) 99.7 F (37.6 C)  TempSrc: Oral Oral  SpO2:  99%   There is no height or weight on file to calculate BMI.   The patient appears stable so that the remainder of the MSE may be completed by another provider.   Elpidio Anis, PA-C 01/13/21 0539    Shon Baton, MD 01/13/21 [phone]

## 2021-01-13 NOTE — ED Notes (Signed)
Provider assessing patient in triage.

## 2021-01-13 NOTE — ED Notes (Signed)
Pt transported to MAU at this time

## 2021-01-15 ENCOUNTER — Inpatient Hospital Stay (HOSPITAL_COMMUNITY)
Admission: AD | Admit: 2021-01-15 | Discharge: 2021-01-16 | Disposition: A | Discharge status: Home / Self Care | Payer: Medicaid Other | Attending: Obstetrics and Gynecology | Admitting: Obstetrics and Gynecology

## 2021-01-15 ENCOUNTER — Other Ambulatory Visit: Payer: Self-pay

## 2021-01-15 DIAGNOSIS — K829 Disease of gallbladder, unspecified: Secondary | ICD-10-CM | POA: Diagnosis not present

## 2021-01-15 DIAGNOSIS — Z3A01 Less than 8 weeks gestation of pregnancy: Secondary | ICD-10-CM | POA: Insufficient documentation

## 2021-01-15 DIAGNOSIS — O26891 Other specified pregnancy related conditions, first trimester: Secondary | ICD-10-CM | POA: Diagnosis present

## 2021-01-15 DIAGNOSIS — Z3A00 Weeks of gestation of pregnancy not specified: Secondary | ICD-10-CM | POA: Diagnosis not present

## 2021-01-15 DIAGNOSIS — O26619 Liver and biliary tract disorders in pregnancy, unspecified trimester: Secondary | ICD-10-CM | POA: Diagnosis not present

## 2021-01-15 MED ORDER — CYCLOBENZAPRINE HCL 5 MG PO TABS
10.0000 mg | ORAL_TABLET | Freq: Once | ORAL | Status: AC
  Administered 2021-01-15: 10 mg via ORAL
  Filled 2021-01-15: qty 2

## 2021-01-15 MED ORDER — ACETAMINOPHEN 500 MG PO TABS
1000.0000 mg | ORAL_TABLET | Freq: Once | ORAL | Status: AC
  Administered 2021-01-15: 1000 mg via ORAL
  Filled 2021-01-15: qty 2

## 2021-01-15 MED ORDER — CYCLOBENZAPRINE HCL 10 MG PO TABS: 10.0000 mg | ORAL_TABLET | Freq: Two times a day (BID) | ORAL | 0 refills | Status: DC | PRN

## 2021-01-15 MED ORDER — HYDROCODONE-ACETAMINOPHEN 5-325 MG PO TABS: 2.0000 | ORAL_TABLET | ORAL | 0 refills | Status: DC | PRN

## 2021-01-15 NOTE — MAU Note (Signed)
Pt has know gallstones. Has script for Oxycodone. Took one and felt pain so took another. Still has pain in upper abd that goes to back but pain is not as bad. Feeling dizzy, and breathing feels shallow. Think maybe the pain med is too strong for my body so wanted to see if there is something else I can do. No pregnancy concerns

## 2021-01-15 NOTE — MAU Provider Note (Addendum)
Event Date/Time   First Provider Initiated Contact with Patient 01/15/21 2239      S [name] is a 25 y.o. G2P1001 patient who presents to MAU today with complaint of gallbladder pain.  Patient has been seen in MAU for this complaint earlier this week- patient reports that she was prescribed nausea medication and pain medication. Nausea medication (phenergan) has been working but pain medication has been causing her to feel dizzy and "breathing shallow" after taking medication. Patient wants to see if there is other medication she can take for this pain that will work but not cause same symptoms.   Patient has appointment to [facility] on Monday. Patient denies any pregnancy related concerns at this time. Denies vaginal bleeding or discharge.   O BP 130/78 (BP Location: Right Arm)   Pulse 82   Temp 98 F (36.7 C)   Resp 16   Ht 5\' 4"  (1.626 m)   Wt 83 kg   LMP 12/08/2020   SpO2 100%   BMI 31.41 kg/m  Physical Exam Vitals and nursing note reviewed.  Constitutional:      General: She is not in acute distress.    Appearance: Normal appearance.  HENT:     Head: Normocephalic.  Cardiovascular:     Rate and Rhythm: Normal rate and regular rhythm.  Pulmonary:     Effort: Pulmonary effort is normal. No respiratory distress.     Breath sounds: Normal breath sounds. No wheezing.  Skin:    General: Skin is warm and dry.  Neurological:     Mental Status: She is alert and oriented to person, place, and time.  Psychiatric:        Mood and Affect: Mood normal.        Behavior: Behavior normal.        Thought Content: Thought content normal.    Educated and discussed changes in medication prescribed. Discussed in addition to taking tylenol on its own can add Flexeril as a muscle relaxer to help with pain. Discussed with patient that is Tylenol and Flexeril does not relieve pain then can add additional dose of narcotic medication - discussed use of  hydrocodone as needed. Patient verbalizes understanding and agrees to plan of care.   Flexeril and Tylenol given in MAU prior to discharge home - Patient rates 6/10 prior to medication.  Reassessment after medication patient reports pain 3-4  A Medical screening exam complete 1. Gallbladder pain     P Discharge from MAU in stable condition Follow up as scheduled with [facility]  Rx for Hydrocodone and flexeril  Warning signs for worsening condition that would warrant emergency follow-up discussed Patient may return to MAU as needed   02/05/2021 01/15/2021 11:20 PM    Attestation of Attending Supervision of Advanced Practice Provider (PA/CNM/NP): Evaluation and management procedures were performed by the Advanced Practice Provider under my supervision and collaboration.  I have reviewed the Advanced Practice Provider's note and chart, and I agree with the management and plan. I have also made any necessary editorial changes.   01/17/2021, DO Attending Obstetrician & Gynecologist, [facility], [facility] 01/17/2021 9:59 AM

## 2021-01-15 NOTE — MAU Note (Addendum)
Pt in Family Rm and Steward Drone CNM aware of pt's status. Will see pt in Family Rm

## 2021-01-18 ENCOUNTER — Ambulatory Visit: Payer: Self-pay | Admitting: Surgery

## 2021-01-18 NOTE — H&P (Signed)
History of Present Illness (Carling Liberman L. [name] MD; 01/17/2021 9:43 AM) The patient is a 25 year old female who presents for evaluation of gall stones.Ms. Switalski presents for follow up of gallstones. She previously saw me in early February after being seen in the ED with biliary colic and mildly elevated Tbili. I suspect she passed a stone via her common bile duct. She was scheduled for elective cholecystectomy last month, but ultimately cancelled surgery. She is now pregnant at approximately [redacted] weeks gestation. She was recently seen in the MAU last week with an episode of severe RUQ, nausea and vomiting. LFTs and amylase were normal. She was discharged and referred to me to discuss cholecystectomy. She reports she still has intermittent RUQ and right flank pain, worse with eating.   Allergies Marliss Coots, CNA; 01/17/2021 9:28 AM) No Known Drug Allergies  [12/08/2020]: Allergies Reconciled   Medication History Marliss Coots, CNA; 01/17/2021 9:28 AM) oxyCODONE-Acetaminophen (5-325MG  Tablet, Oral) Active. No Current Medications (Taken starting 01/17/2021) Cyclobenzaprine HCl (10MG  Tablet, Oral) Active. Ibuprofen (800MG  Tablet, Oral) Active. Lo Loestrin Fe (1 MG-10 MCG /10 MCG Tablet, Oral) Active. Ondansetron (4MG  Tablet Disint, Oral) Active. Promethazine HCl (25MG  Tablet, Oral) Active. Medications Reconciled    Physical Exam (Travis Mastel L. MD; 01/17/2021 9:43 AM) The physical exam findings are as follows: Note: Constitutional: No acute distress; conversant; no deformities Neuro: alert and oriented; cranial nerves grossly in tact; no focal deficits Eyes: Moist conjunctiva; anicteric sclerae; extraocular movements in tact Neck: Trachea midline Lungs: Normal respiratory effort GI: Abdomen soft, nontender, nondistended Psychiatric: Appropriate affect; alert and oriented 3    Assessment & Plan (Iyanla Eilers L. MD; 01/17/2021 9:46 AM) CHOLELITHIASIS (K80.20) Story: 25 yo  female with symptomatic cholelithiasis. She likely also had an episode of choledocholithiasis, and has persistent biliary colic. She is now in her first trimester of pregnancy. I discussed that recent data indicates that cholecystectomy is safe in the first and second trimesters of pregnancy, and that I recommend proceeding with lap cholecystectomy to reduce her risks of cholecystitis and biliary pancreatitis, both of which would pose risks to her pregnancy. I will discuss with her OB at [facility] prior to scheduling her for surgery, to determine the safest timing and whether or not any perioperative fetal monitoring should be done. We discussed the details of laparoscopic cholecystectomy today in clinic. She agrees to proceed and will be contacted to schedule a surgery date.  Addendum Note(Ayodele Sangalang L. [name] MD; 01/18/2021 12:50 PM) I discussed this patient with OB/GYN, who recommended waiting until the second trimester if possible to proceed with cholecystectomy, unless severity of symptoms warrants surgery sooner. Prior to fetal viability, intraop fetal monitoring is not needed but they recommended that fetal heart tones be checked before and after surgery. Given the frequency of the patient's symptoms I am concerned she is at high risk to develop cholecystitis before her second trimester, or could pass another stone and develop pancreatitis. I will plan to schedule her in late April, which will be close to her second trimester. She is scheduled to see [facility] April 11. Can delay surgery if needed following that appointment.  01/20/2021, MD [facility] Surgery General, Hepatobiliary and Pancreatic Surgery 01/18/21 12:51 PM

## 2021-02-12 ENCOUNTER — Inpatient Hospital Stay (HOSPITAL_COMMUNITY)
Admission: AD | Admit: 2021-02-12 | Discharge: 2021-02-13 | Disposition: A | Discharge status: Home / Self Care | Payer: Medicaid Other | Attending: Obstetrics and Gynecology | Admitting: Obstetrics and Gynecology

## 2021-02-12 ENCOUNTER — Other Ambulatory Visit: Payer: Self-pay

## 2021-02-12 ENCOUNTER — Encounter (HOSPITAL_COMMUNITY): Payer: Self-pay | Admitting: Obstetrics and Gynecology

## 2021-02-12 ENCOUNTER — Inpatient Hospital Stay (HOSPITAL_COMMUNITY): Payer: Medicaid Other

## 2021-02-12 DIAGNOSIS — O209 Hemorrhage in early pregnancy, unspecified: Secondary | ICD-10-CM | POA: Diagnosis not present

## 2021-02-12 DIAGNOSIS — O468X1 Other antepartum hemorrhage, first trimester: Secondary | ICD-10-CM

## 2021-02-12 DIAGNOSIS — O26899 Other specified pregnancy related conditions, unspecified trimester: Secondary | ICD-10-CM

## 2021-02-12 DIAGNOSIS — M545 Low back pain, unspecified: Secondary | ICD-10-CM | POA: Insufficient documentation

## 2021-02-12 DIAGNOSIS — A599 Trichomoniasis, unspecified: Secondary | ICD-10-CM | POA: Diagnosis not present

## 2021-02-12 DIAGNOSIS — Z3A01 Less than 8 weeks gestation of pregnancy: Secondary | ICD-10-CM | POA: Diagnosis not present

## 2021-02-12 DIAGNOSIS — O418X10 Other specified disorders of amniotic fluid and membranes, first trimester, not applicable or unspecified: Secondary | ICD-10-CM

## 2021-02-12 DIAGNOSIS — Z79899 Other long term (current) drug therapy: Secondary | ICD-10-CM | POA: Diagnosis not present

## 2021-02-12 DIAGNOSIS — O26891 Other specified pregnancy related conditions, first trimester: Secondary | ICD-10-CM | POA: Insufficient documentation

## 2021-02-12 DIAGNOSIS — O26851 Spotting complicating pregnancy, first trimester: Secondary | ICD-10-CM | POA: Diagnosis not present

## 2021-02-12 DIAGNOSIS — R102 Pelvic and perineal pain: Secondary | ICD-10-CM | POA: Diagnosis not present

## 2021-02-12 DIAGNOSIS — O98311 Other infections with a predominantly sexual mode of transmission complicating pregnancy, first trimester: Secondary | ICD-10-CM | POA: Insufficient documentation

## 2021-02-12 DIAGNOSIS — R109 Unspecified abdominal pain: Secondary | ICD-10-CM

## 2021-02-12 LAB — URINALYSIS, ROUTINE W REFLEX MICROSCOPIC
Bilirubin Urine: NEGATIVE
Glucose, UA: NEGATIVE mg/dL
Ketones, ur: NEGATIVE mg/dL
Nitrite: NEGATIVE
Protein, ur: NEGATIVE mg/dL
RBC / HPF: >50 RBC/hpf — ABNORMAL HIGH (ref 0–5)
Specific Gravity, Urine: 1.021 (ref 1.005–1.030)
pH: 6 (ref 5.0–8.0)

## 2021-02-12 LAB — CBC WITH DIFFERENTIAL/PLATELET
Abs Immature Granulocytes: 0.03 10*3/uL (ref 0.00–0.07)
Basophils Absolute: 0 10*3/uL (ref 0.0–0.1)
Basophils Relative: 0 %
Eosinophils Absolute: 0.5 10*3/uL (ref 0.0–0.5)
Eosinophils Relative: 5 %
HCT: 38.3 % (ref 36.0–46.0)
Hemoglobin: 12.5 g/dL (ref 12.0–15.0)
Immature Granulocytes: 0 %
Lymphocytes Relative: 31 %
Lymphs Abs: 3 10*3/uL (ref 0.7–4.0)
MCH: 28.5 pg (ref 26.0–34.0)
MCHC: 32.6 g/dL (ref 30.0–36.0)
MCV: 87.4 fL (ref 80.0–100.0)
Monocytes Absolute: 0.7 10*3/uL (ref 0.1–1.0)
Monocytes Relative: 7 %
Neutro Abs: 5.5 10*3/uL (ref 1.7–7.7)
Neutrophils Relative %: 57 %
Platelets: 303 10*3/uL (ref 150–400)
RBC: 4.38 MIL/uL (ref 3.87–5.11)
RDW: 13.9 % (ref 11.5–15.5)
WBC: 9.8 10*3/uL (ref 4.0–10.5)
nRBC: 0 % (ref 0.0–0.2)

## 2021-02-12 LAB — COMPREHENSIVE METABOLIC PANEL
ALT: 13 U/L (ref 0–44)
AST: 15 U/L (ref 15–41)
Albumin: 3.4 g/dL — ABNORMAL LOW (ref 3.5–5.0)
Alkaline Phosphatase: 49 U/L (ref 38–126)
Anion gap: 7 (ref 5–15)
BUN: 18 mg/dL (ref 6–20)
CO2: 25 mmol/L (ref 22–32)
Calcium: 9.4 mg/dL (ref 8.9–10.3)
Chloride: 105 mmol/L (ref 98–111)
Creatinine, Ser: 0.86 mg/dL (ref 0.44–1.00)
GFR, Estimated: >60 mL/min (ref >60)
Glucose, Bld: 107 mg/dL — ABNORMAL HIGH (ref 70–99)
Potassium: 3.8 mmol/L (ref 3.5–5.1)
Sodium: 137 mmol/L (ref 135–145)
Total Bilirubin: 0.2 mg/dL — ABNORMAL LOW (ref 0.3–1.2)
Total Protein: 6.8 g/dL (ref 6.5–8.1)

## 2021-02-12 LAB — ABO/RH: ABO/RH(D): A POS

## 2021-02-12 LAB — LIPASE, BLOOD: Lipase: 44 U/L (ref 11–51)

## 2021-02-12 LAB — AMYLASE: Amylase: 64 U/L (ref 28–100)

## 2021-02-12 NOTE — MAU Note (Signed)
..  Kayla [name] is a 25 y.o. at [redacted]w[redacted]d here in MAU reporting: back pain that radiates to her abdomen at 8pm and went to the bathroom and has some vaginal bleeding with a clot. Describes the bleeding as "first or second day of a period."  Pain score: 3/10 Vitals:   02/12/21 2208  BP: 134/88  Pulse: 93  Resp: 15  Temp: 98.2 F (36.8 C)  SpO2: 100%     Lab orders placed from triage: UA

## 2021-02-13 ENCOUNTER — Inpatient Hospital Stay (HOSPITAL_COMMUNITY): Payer: Medicaid Other

## 2021-02-13 ENCOUNTER — Inpatient Hospital Stay (EMERGENCY_DEPARTMENT_HOSPITAL)
Admission: AD | Admit: 2021-02-13 | Discharge: 2021-02-13 | Disposition: A | Discharge status: Home / Self Care | Payer: Medicaid Other | Source: Home / Self Care | Attending: Obstetrics and Gynecology | Admitting: Obstetrics and Gynecology

## 2021-02-13 ENCOUNTER — Other Ambulatory Visit: Payer: Self-pay

## 2021-02-13 DIAGNOSIS — A599 Trichomoniasis, unspecified: Secondary | ICD-10-CM | POA: Diagnosis not present

## 2021-02-13 DIAGNOSIS — O26891 Other specified pregnancy related conditions, first trimester: Secondary | ICD-10-CM | POA: Diagnosis not present

## 2021-02-13 DIAGNOSIS — O98311 Other infections with a predominantly sexual mode of transmission complicating pregnancy, first trimester: Secondary | ICD-10-CM

## 2021-02-13 DIAGNOSIS — Z6710 Type A blood, Rh positive: Secondary | ICD-10-CM

## 2021-02-13 DIAGNOSIS — O4690 Antepartum hemorrhage, unspecified, unspecified trimester: Secondary | ICD-10-CM

## 2021-02-13 DIAGNOSIS — O208 Other hemorrhage in early pregnancy: Secondary | ICD-10-CM | POA: Insufficient documentation

## 2021-02-13 DIAGNOSIS — O209 Hemorrhage in early pregnancy, unspecified: Secondary | ICD-10-CM | POA: Diagnosis not present

## 2021-02-13 DIAGNOSIS — R109 Unspecified abdominal pain: Secondary | ICD-10-CM

## 2021-02-13 DIAGNOSIS — Z3491 Encounter for supervision of normal pregnancy, unspecified, first trimester: Secondary | ICD-10-CM

## 2021-02-13 DIAGNOSIS — Z3A09 9 weeks gestation of pregnancy: Secondary | ICD-10-CM

## 2021-02-13 DIAGNOSIS — Z3A01 Less than 8 weeks gestation of pregnancy: Secondary | ICD-10-CM

## 2021-02-13 LAB — WET PREP, GENITAL
Clue Cells Wet Prep HPF POC: NONE SEEN
Sperm: NONE SEEN
Yeast Wet Prep HPF POC: NONE SEEN

## 2021-02-13 LAB — CBC
HCT: 38.7 % (ref 36.0–46.0)
Hemoglobin: 12.6 g/dL (ref 12.0–15.0)
MCH: 28.5 pg (ref 26.0–34.0)
MCHC: 32.6 g/dL (ref 30.0–36.0)
MCV: 87.6 fL (ref 80.0–100.0)
Platelets: 321 10*3/uL (ref 150–400)
RBC: 4.42 MIL/uL (ref 3.87–5.11)
RDW: 13.9 % (ref 11.5–15.5)
WBC: 9.9 10*3/uL (ref 4.0–10.5)
nRBC: 0 % (ref 0.0–0.2)

## 2021-02-13 LAB — HCG, QUANTITATIVE, PREGNANCY: hCG, Beta Chain, Quant, S: 19144 m[IU]/mL — ABNORMAL HIGH (ref <5)

## 2021-02-13 MED ORDER — METRONIDAZOLE 500 MG PO TABS: 500.0000 mg | ORAL_TABLET | Freq: Two times a day (BID) | ORAL | 0 refills | Status: AC

## 2021-02-13 MED ORDER — HYDROMORPHONE HCL 1 MG/ML IJ SOLN
0.5000 mg | Freq: Once | INTRAMUSCULAR | Status: AC
Start: 2021-02-13 — End: 2021-02-13
  Administered 2021-02-13: 0.5 mg via INTRAMUSCULAR
  Filled 2021-02-13: qty 1

## 2021-02-13 MED ORDER — METRONIDAZOLE 500 MG PO TABS
500.0000 mg | ORAL_TABLET | Freq: Once | ORAL | Status: AC
  Administered 2021-02-13: 500 mg via ORAL
  Filled 2021-02-13: qty 1

## 2021-02-13 NOTE — MAU Note (Signed)
Returns to MAU she states that around 7:30 after she got out of the shower she started having more vaginal bleeding. 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  She states she has been having period-like cramps all day.

## 2021-02-13 NOTE — Discharge Instructions (Signed)

## 2021-02-13 NOTE — MAU Provider Note (Signed)
History     CSN: [id number]  Arrival date and time: 02/12/21 2157   None     Chief Complaint  Patient presents with  . Back Pain  . Abdominal Pain   HPI  Kayla [name] is a 25 y.o. female G2P1001 @ 783w4d here in MAU with complaints of lower back pain, lower abdominal cramping, and vaginal bleeding/spotting. She went to use the restroom and saw a blood clot, and since then has seen streaks of blood while using the bathroom. She rates her pain in her abdomen a 4/10. The pain is dull. The pain comes and goes. She has not taken anything for the pain. Certain about LMP, however hx of abnormal periods.   First OB appointment is next Wednesday with [facility].  OB History    Gravida  2   Para  1   Term  1   Preterm  0   AB  0   Living  1     SAB  0   IAB  0   Ectopic  0   Multiple  0   Live Births  1           Past Medical History:  Diagnosis Date  . Anemia    pregnancy  . Gallstones 10/2020  . Headache     Past Surgical History:  Procedure Laterality Date  . NO PAST SURGERIES      Family History  Problem Relation Age of Onset  . Hypertension Mother   . Other Mother        lymphodema  . Hypertension Maternal Grandmother   . Stroke Maternal Grandmother   . Hypertension Maternal Grandfather   . Other Maternal Grandfather        blood clotting disorder    Social History   Tobacco Use  . Smoking status: Never Smoker  . Smokeless tobacco: Never Used  Vaping Use  . Vaping Use: Never used  Substance Use Topics  . Alcohol use: Not Currently  . Drug use: Never    Allergies:  Allergies  Allergen Reactions  . Shrimp [Shellfish Allergy] Nausea And Vomiting and Rash    PT states she also gets a fever     Medications Prior to Admission  Medication Sig Dispense Refill Last Dose  . acetaminophen (TYLENOL) 500 MG tablet Take 1,000 mg by mouth every 6 (six) hours as needed.   Past Month at Unknown time  . cyclobenzaprine  (FLEXERIL) 10 MG tablet Take 1 tablet (10 mg total) by mouth 2 (two) times daily as needed for muscle spasms. 30 tablet 0 02/11/2021 at Unknown time  . HYDROcodone-acetaminophen (NORCO/VICODIN) 5-325 MG tablet Take 2 tablets by mouth every 4 (four) hours as needed for moderate pain or severe pain. 20 tablet 0 Past Week at Unknown time  . ondansetron (ZOFRAN ODT) 4 MG disintegrating tablet Take 1 tablet (4 mg total) by mouth every 6 (six) hours as needed for nausea. 20 tablet 0 Past Month at Unknown time  . Prenatal Vit-Fe Fumarate-FA (PRENATAL MULTIVITAMIN) TABS tablet Take 1 tablet by mouth daily at 12 noon.   02/11/2021 at Unknown time  . promethazine (PHENERGAN) 25 MG tablet Take 1 tablet (25 mg total) by mouth every 6 (six) hours as needed for nausea or vomiting. 30 tablet 2 Past Month at Unknown time   Results for orders placed or performed during the hospital encounter of 02/12/21 (from the past 48 hour(s))  Urinalysis, Routine w reflex microscopic Urine, Clean  Catch     Status: Abnormal   Collection Time: 02/12/21 10:25 PM  Result Value Ref Range   Color, Urine YELLOW YELLOW   APPearance HAZY (A) CLEAR   Specific Gravity, Urine 1.021 1.005 - 1.030   pH 6.0 5.0 - 8.0   Glucose, UA NEGATIVE NEGATIVE mg/dL   Hgb urine dipstick LARGE (A) NEGATIVE   Bilirubin Urine NEGATIVE NEGATIVE   Ketones, ur NEGATIVE NEGATIVE mg/dL   Protein, ur NEGATIVE NEGATIVE mg/dL   Nitrite NEGATIVE NEGATIVE   Leukocytes,Ua SMALL (A) NEGATIVE   RBC / HPF >50 (H) 0 - 5 RBC/hpf   WBC, UA 11-20 0 - 5 WBC/hpf   Bacteria, UA RARE (A) NONE SEEN   Squamous Epithelial / LPF 6-10 0 - 5   Mucus PRESENT     Comment: Performed at [facility], [address] [place], [place] [address]  CBC with Differential/Platelet     Status: None   Collection Time: 02/12/21 10:31 PM  Result Value Ref Range   WBC 9.8 4.0 - 10.5 K/uL   RBC 4.38 3.87 - 5.11 MIL/uL   Hemoglobin 12.5 12.0 - 15.0 g/dL   HCT 97.6 73.4 - 19.3 %    MCV 87.4 80.0 - 100.0 fL   MCH 28.5 26.0 - 34.0 pg   MCHC 32.6 30.0 - 36.0 g/dL   RDW 79.0 24.0 - 97.3 %   Platelets 303 150 - 400 K/uL   nRBC 0.0 0.0 - 0.2 %   Neutrophils Relative % 57 %   Neutro Abs 5.5 1.7 - 7.7 K/uL   Lymphocytes Relative 31 %   Lymphs Abs 3.0 0.7 - 4.0 K/uL   Monocytes Relative 7 %   Monocytes Absolute 0.7 0.1 - 1.0 K/uL   Eosinophils Relative 5 %   Eosinophils Absolute 0.5 0.0 - 0.5 K/uL   Basophils Relative 0 %   Basophils Absolute 0.0 0.0 - 0.1 K/uL   Immature Granulocytes 0 %   Abs Immature Granulocytes 0.03 0.00 - 0.07 K/uL    Comment: Performed at [facility], [address] [place], [place] [address]  Comprehensive metabolic panel     Status: Abnormal   Collection Time: 02/12/21 10:31 PM  Result Value Ref Range   Sodium 137 135 - 145 mmol/L   Potassium 3.8 3.5 - 5.1 mmol/L   Chloride 105 98 - 111 mmol/L   CO2 25 22 - 32 mmol/L   Glucose, Bld 107 (H) 70 - 99 mg/dL    Comment: Glucose reference range applies only to samples taken after fasting for at least 8 hours.   BUN 18 6 - 20 mg/dL   Creatinine, Ser 2.42 0.44 - 1.00 mg/dL   Calcium 9.4 8.9 - 68.3 mg/dL   Total Protein 6.8 6.5 - 8.1 g/dL   Albumin 3.4 (L) 3.5 - 5.0 g/dL   AST 15 15 - 41 U/L   ALT 13 0 - 44 U/L   Alkaline Phosphatase 49 38 - 126 U/L   Total Bilirubin 0.2 (L) 0.3 - 1.2 mg/dL   GFR, Estimated >41 >96 mL/min    Comment: (NOTE) Calculated using the CKD-EPI Creatinine Equation (2021)    Anion gap 7 5 - 15    Comment: Performed at [facility], [address], [place], [place] [address]  ABO/Rh     Status: None   Collection Time: 02/12/21 10:31 PM  Result Value Ref Range   ABO/RH(D) A POS    No  rh immune globuloin      NOT A RH IMMUNE GLOBULIN CANDIDATE, PT RH POSITIVE Performed at [facility], [address], [place], [place] [address]   hCG, quantitative, pregnancy     Status: Abnormal   Collection Time: 02/12/21 10:31 PM  Result Value Ref Range    hCG, Beta Chain, Quant, S 19,144 (H) <5 mIU/mL    Comment:          GEST. AGE      CONC.  (mIU/mL)   <=1 WEEK        5 - 50     2 WEEKS       50 - 500     3 WEEKS       100 - 10,000     4 WEEKS     1,000 - 30,000     5 WEEKS     3,500 - 115,000   6-8 WEEKS     12,000 - 270,000    12 WEEKS     15,000 - 220,000        FEMALE AND NON-PREGNANT FEMALE:     LESS THAN 5 mIU/mL Performed at [facility], [address] [place], [place] [address]   Amylase     Status: None   Collection Time: 02/12/21 10:31 PM  Result Value Ref Range   Amylase 64 28 - 100 U/L    Comment: Performed at [facility], [address], [place], [place] [address]  Lipase, blood     Status: None   Collection Time: 02/12/21 10:31 PM  Result Value Ref Range   Lipase 44 11 - 51 U/L    Comment: Performed at [facility], [address] [place], [place] [address]  Wet prep, genital     Status: Abnormal   Collection Time: 02/13/21 12:19 AM   Specimen: Cervix  Result Value Ref Range   Yeast Wet Prep HPF POC NONE SEEN NONE SEEN   Trich, Wet Prep PRESENT (A) NONE SEEN   Clue Cells Wet Prep HPF POC NONE SEEN NONE SEEN   WBC, Wet Prep HPF POC MANY (A) NONE SEEN   Sperm NONE SEEN     Comment: Performed at [facility], [address], [place], [place] [address]   US OB LESS THAN 14 WEEKS WITH OB TRANSVAGINAL  Result Date: 02/12/2021 CLINICAL DATA:  Initial evaluation for acute vaginal bleeding, pain, early pregnancy. EXAM: OBSTETRIC <14 WK [place] AND TRANSVAGINAL OB US TECHNIQUE: Both transabdominal and transvaginal ultrasound examinations were performed for complete evaluation of the gestation as well as the maternal uterus, adnexal regions, and pelvic cul-de-sac. Transvaginal technique was performed to assess early pregnancy. COMPARISON:  None. FINDINGS: Intrauterine gestational sac: Single Yolk sac:  Present Embryo:  Present Cardiac Activity: Negative. Heart Rate: N/A CRL: 3.9 mm   6 w   0 d                   [place] EDC: 10/08/2021 Subchorionic hemorrhage: Small subchorionic hemorrhage without associated mass effect. Maternal uterus/adnexae: Ovaries within normal limits bilaterally. Small degenerating corpus luteal cyst noted within the right ovary. No adnexal mass or free fluid. IMPRESSION: 1. Single IUP with embryo, crown-rump length measures 3.9 mm, but no detectable cardiac activity. Findings are suspicious but not yet definitive for failed pregnancy. Recommend follow-up US in 10-14 days for definitive diagnosis. This recommendation follows SRU consensus guidelines: Diagnostic Criteria for Nonviable Pregnancy Early in  the First Trimester. Malva Limes Med 2013; 712:1975-88. 2. Small subchorionic hemorrhage without mass effect. 3. No other acute maternal uterine or adnexal abnormality. Electronically Signed   By: Rise Mu M.D.   On: 02/12/2021 23:31    Review of Systems  Constitutional: Negative for fever.  Gastrointestinal: Positive for abdominal pain.  Genitourinary: Positive for vaginal bleeding and vaginal discharge.  Musculoskeletal: Positive for back pain.   Physical Exam   Blood pressure 134/88, pulse 93, temperature 98.2 F (36.8 C), temperature source Oral, resp. rate 15, height 5\' 4"  (1.626 m), weight 85.6 kg, last menstrual period 12/08/2020, SpO2 100 %, unknown if currently breastfeeding.  Physical Exam Constitutional:      General: She is not in acute distress.    Appearance: She is well-developed. She is obese. She is not ill-appearing, toxic-appearing or diaphoretic.  HENT:     Head: Normocephalic.  Genitourinary:    Comments: Vagina - Small amount of pink vaginal discharge, no odor  Cervix - No contact bleeding, no active bleeding  Bimanual exam: Cervix closed Uterus non tender, enlarged Adnexa non tender, no masses bilaterally GC/Chlam, wet prep done Chaperone present for exam.  Neurological:     Mental Status: She is alert and oriented to person, place, and  time.    MAU Course  Procedures  MDM  A positive blood type  Wet prep & GC HIV, CBC, Hcg, ABO 02/05/2021 OB transvaginal  + trichomonas on wet prep, 500 mg of flagyl given now. Will RX for 7 days of flagyl. Discussed with patient.   Assessment and Plan   A:  1. Subchorionic hematoma in first trimester, single or unspecified fetus   2. Abdominal pain affecting pregnancy   3. Trichomonas infection   4. [redacted] weeks gestation of pregnancy     P:  Discharge home in stable condition Spoke with Dr. [place], reviewed f/u in the office for viability. Office will follow her and call her. Rx: Flagyl x 7 days, first dose now Strict return precautions Pelvic rest Partner needs STI treatment.   Timothy Lasso, NP 02/13/2021 12:59 AM

## 2021-02-13 NOTE — MAU Provider Note (Signed)
History     CSN: [id number]  Arrival date and time: 02/13/21 1924   Event Date/Time   First Provider Initiated Contact with Patient 02/13/21 2012      Chief Complaint  Patient presents with  . Vaginal Bleeding   HPI Kayla [name] is a 25 y.o. G2P1001 at [redacted]w[redacted]d who presents with vaginal bleeding. She states she was in the shower and started having "bleeding like a faucet turned on" and passed a large clot. She states she is still bleeding heavily now. She reports cramping that she rates a 5/10 and has not tried anything for. She was seen yesterday in MAU and diagnosed with an IUP, but no fetal heart rate was seen yet.   OB History    Gravida  2   Para  1   Term  1   Preterm  0   AB  0   Living  1     SAB  0   IAB  0   Ectopic  0   Multiple  0   Live Births  1           Past Medical History:  Diagnosis Date  . Anemia    pregnancy  . Gallstones 10/2020  . Headache     Past Surgical History:  Procedure Laterality Date  . NO PAST SURGERIES      Family History  Problem Relation Age of Onset  . Hypertension Mother   . Other Mother        lymphodema  . Hypertension Maternal Grandmother   . Stroke Maternal Grandmother   . Hypertension Maternal Grandfather   . Other Maternal Grandfather        blood clotting disorder    Social History   Tobacco Use  . Smoking status: Never Smoker  . Smokeless tobacco: Never Used  Vaping Use  . Vaping Use: Never used  Substance Use Topics  . Alcohol use: Not Currently  . Drug use: Never    Allergies:  Allergies  Allergen Reactions  . Shrimp [Shellfish Allergy] Nausea And Vomiting and Rash    PT states she also gets a fever     Medications Prior to Admission  Medication Sig Dispense Refill Last Dose  . acetaminophen (TYLENOL) 500 MG tablet Take 1,000 mg by mouth every 6 (six) hours as needed.     . cyclobenzaprine (FLEXERIL) 10 MG tablet Take 1 tablet (10 mg total) by mouth 2 (two) times daily as  needed for muscle spasms. 30 tablet 0   . metroNIDAZOLE (FLAGYL) 500 MG tablet Take 1 tablet (500 mg total) by mouth 2 (two) times daily for 7 days. 13 tablet 0   . ondansetron (ZOFRAN ODT) 4 MG disintegrating tablet Take 1 tablet (4 mg total) by mouth every 6 (six) hours as needed for nausea. 20 tablet 0   . Prenatal Vit-Fe Fumarate-FA (PRENATAL MULTIVITAMIN) TABS tablet Take 1 tablet by mouth daily at 12 noon.     . promethazine (PHENERGAN) 25 MG tablet Take 1 tablet (25 mg total) by mouth every 6 (six) hours as needed for nausea or vomiting. 30 tablet 2     Review of Systems  Constitutional: Negative.  Negative for fatigue and fever.  HENT: Negative.   Respiratory: Negative.  Negative for shortness of breath.   Cardiovascular: Negative.  Negative for chest pain.  Gastrointestinal: Positive for abdominal pain. Negative for constipation, diarrhea, nausea and vomiting.  Genitourinary: Positive for vaginal bleeding. Negative for dysuria.  Neurological:  Negative.  Negative for dizziness and headaches.   Physical Exam   Blood pressure 119/84, pulse 93, resp. rate 18, last menstrual period 12/08/2020, SpO2 100 %, unknown if currently breastfeeding.  Physical Exam Vitals and nursing note reviewed.  Constitutional:      General: She is not in acute distress.    Appearance: She is well-developed.  HENT:     Head: Normocephalic.  Eyes:     Pupils: Pupils are equal, round, and reactive to light.  Cardiovascular:     Rate and Rhythm: Normal rate and regular rhythm.     Heart sounds: Normal heart sounds.  Pulmonary:     Effort: Pulmonary effort is normal. No respiratory distress.     Breath sounds: Normal breath sounds.  Abdominal:     General: Bowel sounds are normal. There is no distension.     Palpations: Abdomen is soft.     Tenderness: There is no abdominal tenderness.  Genitourinary:    Comments: SSE: small amount of dark red blood oozing from cervix and in vault.  Skin:     General: Skin is warm and dry.  Neurological:     Mental Status: She is alert and oriented to person, place, and time.  Psychiatric:        Behavior: Behavior normal.        Thought Content: Thought content normal.        Judgment: Judgment normal.     MAU Course  Procedures Results for orders placed or performed during the hospital encounter of 02/13/21 (from the past 24 hour(s))  CBC     Status: None   Collection Time: 02/13/21  8:26 PM  Result Value Ref Range   WBC 9.9 4.0 - 10.5 K/uL   RBC 4.42 3.87 - 5.11 MIL/uL   Hemoglobin 12.6 12.0 - 15.0 g/dL   HCT 52.7 78.2 - 42.3 %   MCV 87.6 80.0 - 100.0 fL   MCH 28.5 26.0 - 34.0 pg   MCHC 32.6 30.0 - 36.0 g/dL   RDW 53.6 14.4 - 31.5 %   Platelets 321 150 - 400 K/uL   nRBC 0.0 0.0 - 0.2 %   US OB Transvaginal  Result Date: 02/13/2021 CLINICAL DATA:  Heavy bleeding EXAM: TRANSVAGINAL OB ULTRASOUND TECHNIQUE: Transvaginal ultrasound was performed for complete evaluation of the gestation as well as the maternal uterus, adnexal regions, and pelvic cul-de-sac. COMPARISON:  Ultrasound from the previous day. FINDINGS: Intrauterine gestational sac: Present Yolk sac:  Absent Embryo:  Present Cardiac Activity: Absent Subchorionic hemorrhage: Small subchorionic hemorrhage is again identified. Maternal uterus/adnexae: Ovaries are within normal limits. No free fluid is noted. IMPRESSION: Single IUP similar to that seen on the previous day with fetal pole although no discernible cardiac activity is identified. Findings are suspicious but not yet definitive for failed pregnancy. Recommend follow-up US in 10-14 days for definitive diagnosis. This recommendation follows SRU consensus guidelines: Diagnostic Criteria for Nonviable Pregnancy Early in the First Trimester. Malva Limes Med 2013; 400:8676-19. Electronically Signed   By: Alcide Clever M.D.   On: 02/13/2021 21:44   US OB LESS THAN 14 WEEKS WITH OB TRANSVAGINAL  Result Date: 02/12/2021 CLINICAL DATA:   Initial evaluation for acute vaginal bleeding, pain, early pregnancy. EXAM: OBSTETRIC <14 WK [place] AND TRANSVAGINAL OB US TECHNIQUE: Both transabdominal and transvaginal ultrasound examinations were performed for complete evaluation of the gestation as well as the maternal uterus, adnexal regions, and pelvic cul-de-sac. Transvaginal technique was performed to assess early  pregnancy. COMPARISON:  None. FINDINGS: Intrauterine gestational sac: Single Yolk sac:  Present Embryo:  Present Cardiac Activity: Negative. Heart Rate: N/A CRL: 3.9 mm   6 w   0 d                  [place] EDC: 10/08/2021 Subchorionic hemorrhage: Small subchorionic hemorrhage without associated mass effect. Maternal uterus/adnexae: Ovaries within normal limits bilaterally. Small degenerating corpus luteal cyst noted within the right ovary. No adnexal mass or free fluid. IMPRESSION: 1. Single IUP with embryo, crown-rump length measures 3.9 mm, but no detectable cardiac activity. Findings are suspicious but not yet definitive for failed pregnancy. Recommend follow-up US in 10-14 days for definitive diagnosis. This recommendation follows SRU consensus guidelines: Diagnostic Criteria for Nonviable Pregnancy Early in the First Trimester. Malva Limes Med 2013; 924:2683-41. 2. Small subchorionic hemorrhage without mass effect. 3. No other acute maternal uterine or adnexal abnormality. Electronically Signed   By: Rise Mu M.D.   On: 02/12/2021 23:31   MDM CBC  0.5mg  Dilaudid IM US OB Transvaginal  Discussed with patient that there is no change in ultrasound from last night to today. Encouraged patient to keep follow up plan as discussed last night. Warning signs for bleeding discussed at length. Reassurance provided of findings tonight but discussed cautious optimism with this amount of bleeding.   Assessment and Plan   1. Normal intrauterine pregnancy on prenatal ultrasound in first trimester   2. Vaginal bleeding in pregnancy   3. [redacted] weeks  gestation of pregnancy   4. Type A blood, Rh positive    -Discharge home in stable condition -Vaginal bleeding and pain precautions discussed -Patient advised to follow-up with OB as discussed.  -Patient may return to MAU as needed or if her condition were to change or worsen   Rolm Bookbinder CNM 02/13/2021, 8:12 PM

## 2021-02-13 NOTE — Discharge Instructions (Signed)
Trichomoniasis Trichomoniasis is an STI (sexually transmitted infection) that can affect both women and men. In women, the outer area of the female genitalia (vulva) and the vagina are affected. In men, mainly the penis is affected, but the prostate and other reproductive organs can also be involved.  This condition can be treated with medicine. It often has no symptoms (is asymptomatic), especially in men. If not treated, trichomoniasis can last for months or years. What are the causes? This condition is caused by a parasite called Trichomonas vaginalis. Trichomoniasis most often spreads from person to person (is contagious) through sexual contact. What increases the risk? The following factors may make you more likely to develop this condition:  Having unprotected sex.  Having sex with a partner who has trichomoniasis.  Having multiple sexual partners.  Having had previous trichomoniasis infections or other STIs. What are the signs or symptoms? In women, symptoms of trichomoniasis include:  Abnormal vaginal discharge that is clear, white, gray, or yellow-green and foamy and has an unusual "fishy" odor.  Itching and irritation of the vagina and vulva.  Burning or pain during urination or sex.  Redness and swelling of the genitals. In men, symptoms of trichomoniasis include:  Penile discharge that may be foamy or contain pus.  Pain in the penis. This may happen only when urinating.  Itching or irritation inside the penis.  Burning after urination or ejaculation. How is this diagnosed? In women, this condition may be found during a routine Pap test or physical exam. It may be found in men during a routine physical exam. Your health care provider may do tests to help diagnose this infection, such as:  Urine tests (men and women).  The following in women: ? Testing the pH of the vagina. ? A vaginal swab test that checks for the Trichomonas vaginalis parasite. ? Testing vaginal  secretions. Your health care provider may test you for other STIs, including HIV (human immunodeficiency virus). How is this treated? This condition is treated with medicine taken by mouth (orally), such as metronidazole or tinidazole, to fight the infection. Your sexual partner(s) also need to be tested and treated.  If you are a woman and you plan to become pregnant or think you may be pregnant, tell your health care provider right away. Some medicines that are used to treat the infection should not be taken during pregnancy. Your health care provider may recommend over-the-counter medicines or creams to help relieve itching or irritation. You may be tested for infection again 3 months after treatment.   Follow these instructions at home:  Take and use over-the-counter and prescription medicines, including creams, only as told by your health care provider.  Take your antibiotic medicine as told by your health care provider. Do not stop taking the antibiotic even if you start to feel better.  Do not have sex until 7-10 days after you finish your medicine, or until your health care provider approves. Ask your health care provider when you may start to have sex again.  (Women) Do not douche or wear tampons while you have the infection.  Discuss your infection with your sexual partner(s). Make sure that your partner gets tested and treated, if necessary.  Keep all follow-up visits as told by your health care provider. This is important. How is this prevented?  Use condoms every time you have sex. Using condoms correctly and consistently can help protect against STIs.  Avoid having multiple sexual partners.  Talk with your sexual partner about   any symptoms that either of you may have, as well as any history of STIs.  Get tested for STIs and STDs (sexually transmitted diseases) before you have sex. Ask your partner to do the same.  Do not have sexual contact if you have symptoms of  trichomoniasis or another STI.   Contact a health care provider if:  You still have symptoms after you finish your medicine.  You develop pain in your abdomen.  You have pain when you urinate.  You have bleeding after sex.  You develop a rash.  You feel nauseous or you vomit.  You plan to become pregnant or think you may be pregnant. Summary  Trichomoniasis is an STI (sexually transmitted infection) that can affect both women and men.  This condition often has no symptoms (is asymptomatic), especially in men.  Without treatment, this condition can last for months or years.  You should not have sex until 7-10 days after you finish your medicine, or until your health care provider approves. Ask your health care provider when you may start to have sex again.  Discuss your infection with your sexual partner(s). Make sure that your partner gets tested and treated, if necessary. This information is not intended to replace advice given to you by your health care provider. Make sure you discuss any questions you have with your health care provider. Document Revised: 07/30/2018 Document Reviewed: 07/30/2018 Elsevier Patient Education  2021 [employer] Inc.  

## 2021-02-14 LAB — GC/CHLAMYDIA PROBE AMP (~~LOC~~) NOT AT ARMC
Chlamydia: POSITIVE — AB
Comment: NEGATIVE
Comment: NORMAL
Neisseria Gonorrhea: NEGATIVE

## 2021-02-15 ENCOUNTER — Telehealth: Payer: Self-pay | Admitting: Student

## 2021-02-15 DIAGNOSIS — A749 Chlamydial infection, unspecified: Secondary | ICD-10-CM

## 2021-02-15 MED ORDER — AZITHROMYCIN 500 MG PO TABS: 1000.0000 mg | ORAL_TABLET | Freq: Once | ORAL | 0 refills | Status: AC

## 2021-02-15 NOTE — Telephone Encounter (Addendum)
Kayla [name] tested positive for  Chlamydia. Patient was called by RN and allergies and pharmacy confirmed. Rx sent to pharmacy of choice.   Judeth Horn, NP 02/15/2021 2:01 PM          ----- Message from Kathe Becton, RN sent at 02/15/2021  1:46 PM EDT ----- This patient tested positive for :  Chlamydia  She "has NKDA", I have informed the patient of her results and confirmed her pharmacy is correct in her chart. Please send Rx.   Thank you,   Kathe Becton, RN   Results faxed to [facility].

## 2021-02-18 ENCOUNTER — Inpatient Hospital Stay (HOSPITAL_COMMUNITY): Payer: Medicaid Other

## 2021-02-18 ENCOUNTER — Other Ambulatory Visit: Payer: Self-pay

## 2021-02-18 ENCOUNTER — Inpatient Hospital Stay (HOSPITAL_COMMUNITY)
Admission: AD | Admit: 2021-02-18 | Discharge: 2021-02-18 | Disposition: A | Discharge status: Home / Self Care | Payer: Medicaid Other | Attending: Obstetrics & Gynecology | Admitting: Obstetrics & Gynecology

## 2021-02-18 ENCOUNTER — Encounter (HOSPITAL_COMMUNITY): Payer: Self-pay | Admitting: Obstetrics & Gynecology

## 2021-02-18 DIAGNOSIS — Z6790 Unspecified blood type, Rh positive: Secondary | ICD-10-CM

## 2021-02-18 DIAGNOSIS — Z881 Allergy status to other antibiotic agents status: Secondary | ICD-10-CM | POA: Insufficient documentation

## 2021-02-18 DIAGNOSIS — R102 Pelvic and perineal pain: Secondary | ICD-10-CM | POA: Diagnosis present

## 2021-02-18 DIAGNOSIS — Z3A01 Less than 8 weeks gestation of pregnancy: Secondary | ICD-10-CM | POA: Insufficient documentation

## 2021-02-18 DIAGNOSIS — O039 Complete or unspecified spontaneous abortion without complication: Secondary | ICD-10-CM | POA: Insufficient documentation

## 2021-02-18 DIAGNOSIS — O4690 Antepartum hemorrhage, unspecified, unspecified trimester: Secondary | ICD-10-CM

## 2021-02-18 DIAGNOSIS — O3680X0 Pregnancy with inconclusive fetal viability, not applicable or unspecified: Secondary | ICD-10-CM

## 2021-02-18 LAB — URINALYSIS, ROUTINE W REFLEX MICROSCOPIC
Bacteria, UA: NONE SEEN
Bilirubin Urine: NEGATIVE
Glucose, UA: NEGATIVE mg/dL
Ketones, ur: NEGATIVE mg/dL
Leukocytes,Ua: NEGATIVE
Nitrite: NEGATIVE
Protein, ur: 100 mg/dL — AB
RBC / HPF: >50 RBC/hpf — ABNORMAL HIGH (ref 0–5)
Specific Gravity, Urine: 1.028 (ref 1.005–1.030)
pH: 7 (ref 5.0–8.0)

## 2021-02-18 LAB — CBC
HCT: 37.8 % (ref 36.0–46.0)
Hemoglobin: 12.1 g/dL (ref 12.0–15.0)
MCH: 28.3 pg (ref 26.0–34.0)
MCHC: 32 g/dL (ref 30.0–36.0)
MCV: 88.3 fL (ref 80.0–100.0)
Platelets: 358 10*3/uL (ref 150–400)
RBC: 4.28 MIL/uL (ref 3.87–5.11)
RDW: 14 % (ref 11.5–15.5)
WBC: 9 10*3/uL (ref 4.0–10.5)
nRBC: 0 % (ref 0.0–0.2)

## 2021-02-18 LAB — HCG, QUANTITATIVE, PREGNANCY: hCG, Beta Chain, Quant, S: 424 m[IU]/mL — ABNORMAL HIGH (ref <5)

## 2021-02-18 MED ORDER — CYCLOBENZAPRINE HCL 5 MG PO TABS
10.0000 mg | ORAL_TABLET | Freq: Once | ORAL | Status: AC
  Administered 2021-02-18: 10 mg via ORAL
  Filled 2021-02-18: qty 2

## 2021-02-18 NOTE — MAU Provider Note (Signed)
History     CSN: [id number]  Arrival date and time: 02/18/21 1627   Event Date/Time   First Provider Initiated Contact with Patient 02/18/21 1751      Chief Complaint  Patient presents with  . Back Pain  . Pelvic Pain   Kayla [name] is a 25 y.o. G2P1001 at [redacted]w[redacted]d who presents to MAU for pelvic pain and vaginal bleeding. Patient reports the vaginal bleeding has been on-going since approximately 02/12/2021, which caused her to come to MAU on 02/13/2021. At that time, patient was found to have a [facility] with fetal pole but no FHB and was set up for a repeat US in about two weeks to check for viability. Patient reports when she first came in on 02/13/2021 she was having these waves of pain that would start in her back and wrap around bilaterally to the front that she reported were contraction-like pain. Patient reports this pain subsided and was like menstrual cramping for a while, but then the contraction-like pain returned today, which is what made her come in to MAU. Patient reports she took 1000mg  Tylenol around 230PM this afternoon, which did not help.  Pt denies LOF, ctx, decreased FM, vaginal discharge/odor/itching. Pt denies N/V, constipation, diarrhea, or urinary problems. Pt denies fever, chills, fatigue, sweating or changes in appetite. Pt denies SOB or chest pain. Pt denies dizziness, HA, light-headedness, weakness.  Problems this pregnancy include: chlamydia (treated), trichomonas (currently in treatment). Allergies? Shrimp Current medications/supplements? Metronidazole Prenatal care provider? May 4th for May 6   OB History    Gravida  2   Para  1   Term  1   Preterm  0   AB  0   Living  1     SAB  0   IAB  0   Ectopic  0   Multiple  0   Live Births  1           Past Medical History:  Diagnosis Date  . Anemia    pregnancy  . Gallstones 10/2020  . Headache     Past Surgical History:  Procedure Laterality Date  . NO PAST SURGERIES       Family History  Problem Relation Age of Onset  . Hypertension Mother   . Other Mother        lymphodema  . Hypertension Maternal Grandmother   . Stroke Maternal Grandmother   . Hypertension Maternal Grandfather   . Other Maternal Grandfather        blood clotting disorder    Social History   Tobacco Use  . Smoking status: Never Smoker  . Smokeless tobacco: Never Used  Vaping Use  . Vaping Use: Never used  Substance Use Topics  . Alcohol use: Not Currently  . Drug use: Never    Allergies:  Allergies  Allergen Reactions  . Shrimp [Shellfish Allergy] Nausea And Vomiting and Rash    PT states she also gets a fever     Medications Prior to Admission  Medication Sig Dispense Refill Last Dose  . metroNIDAZOLE (FLAGYL) 500 MG tablet Take 1 tablet (500 mg total) by mouth 2 (two) times daily for 7 days. 13 tablet 0 02/18/2021 at Unknown time  . Prenatal Vit-Fe Fumarate-FA (PRENATAL MULTIVITAMIN) TABS tablet Take 1 tablet by mouth daily at 12 noon.   02/18/2021 at Unknown time  . acetaminophen (TYLENOL) 500 MG tablet Take 1,000 mg by mouth every 6 (six) hours as needed.     02/20/2021  cyclobenzaprine (FLEXERIL) 10 MG tablet Take 1 tablet (10 mg total) by mouth 2 (two) times daily as needed for muscle spasms. 30 tablet 0   . ondansetron (ZOFRAN ODT) 4 MG disintegrating tablet Take 1 tablet (4 mg total) by mouth every 6 (six) hours as needed for nausea. 20 tablet 0   . promethazine (PHENERGAN) 25 MG tablet Take 1 tablet (25 mg total) by mouth every 6 (six) hours as needed for nausea or vomiting. 30 tablet 2     Review of Systems  Constitutional: Negative for chills, diaphoresis, fatigue and fever.  Eyes: Negative for visual disturbance.  Respiratory: Negative for shortness of breath.   Cardiovascular: Negative for chest pain.  Gastrointestinal: Positive for abdominal pain. Negative for constipation, diarrhea, nausea and vomiting.  Genitourinary: Positive for vaginal bleeding. Negative  for dysuria, flank pain, frequency, pelvic pain, urgency and vaginal discharge.  Neurological: Negative for dizziness, weakness, light-headedness and headaches.   Physical Exam   Blood pressure 130/85, pulse 81, temperature 98.2 F (36.8 C), temperature source Oral, resp. rate 18, height 5\' 4"  (1.626 m), weight 86.4 kg, last menstrual period 12/08/2020, SpO2 100 %, unknown if currently breastfeeding.  Patient Vitals for the past 24 hrs:  BP Temp Temp src Pulse Resp SpO2 Height Weight  02/18/21 1735 130/85 -- -- 81 -- 100 % -- --  02/18/21 1704 124/79 98.2 F (36.8 C) Oral 82 18 99 % -- --  02/18/21 1656 -- -- -- -- -- -- 5\' 4"  (1.626 m) 86.4 kg   Physical Exam Vitals and nursing note reviewed.  Constitutional:      General: She is not in acute distress.    Appearance: Normal appearance. She is not ill-appearing, toxic-appearing or diaphoretic.  HENT:     Head: Normocephalic and atraumatic.  Pulmonary:     Effort: Pulmonary effort is normal.  Neurological:     Mental Status: She is alert and oriented to person, place, and time.  Psychiatric:        Mood and Affect: Mood normal.        Behavior: Behavior normal.        Thought Content: Thought content normal.        Judgment: Judgment normal.    Results for orders placed or performed during the hospital encounter of 02/18/21 (from the past 24 hour(s))  Urinalysis, Routine w reflex microscopic Urine, Clean Catch     Status: Abnormal   Collection Time: 02/18/21  5:55 PM  Result Value Ref Range   Color, Urine AMBER (A) YELLOW   APPearance CLOUDY (A) CLEAR   Specific Gravity, Urine 1.028 1.005 - 1.030   pH 7.0 5.0 - 8.0   Glucose, UA NEGATIVE NEGATIVE mg/dL   Hgb urine dipstick LARGE (A) NEGATIVE   Bilirubin Urine NEGATIVE NEGATIVE   Ketones, ur NEGATIVE NEGATIVE mg/dL   Protein, ur 02/20/21 (A) NEGATIVE mg/dL   Nitrite NEGATIVE NEGATIVE   Leukocytes,Ua NEGATIVE NEGATIVE   RBC / HPF >50 (H) 0 - 5 RBC/hpf   WBC, UA 0-5 0 - 5  WBC/hpf   Bacteria, UA NONE SEEN NONE SEEN   Squamous Epithelial / LPF 0-5 0 - 5    02/20/21 OB Transvaginal  Result Date: 02/18/2021 CLINICAL DATA:  Vaginal bleeding, ultrasound on 02/13/2021 with intrauterine pregnancy with fetal pole but without yolk sac or fetal heart rate EXAM: OBSTETRIC <14 WK ULTRASOUND TECHNIQUE: Transabdominal ultrasound was performed for evaluation of the gestation as well as the maternal uterus and adnexal regions.  COMPARISON:  02/13/2021. FINDINGS: Intrauterine gestational sac: None Yolk sac:  Not Visualized. Embryo:  Not Visualized. Cardiac Activity: Not visualized. Subchorionic hemorrhage:  None visualized. Maternal uterus/adnexae: Thickened endometrial stripe measuring 10 mm without vascular flow. IMPRESSION: Findings consistent with failed first trimester pregnancy. Thickened endometrial stripe without other findings to suggest retained products of conception. Electronically Signed   By: Maudry Mayhew MD   On: 02/18/2021 18:54   US OB Transvaginal  Result Date: 02/13/2021 CLINICAL DATA:  Heavy bleeding EXAM: TRANSVAGINAL OB ULTRASOUND TECHNIQUE: Transvaginal ultrasound was performed for complete evaluation of the gestation as well as the maternal uterus, adnexal regions, and pelvic cul-de-sac. COMPARISON:  Ultrasound from the previous day. FINDINGS: Intrauterine gestational sac: Present Yolk sac:  Absent Embryo:  Present Cardiac Activity: Absent Subchorionic hemorrhage: Small subchorionic hemorrhage is again identified. Maternal uterus/adnexae: Ovaries are within normal limits. No free fluid is noted. IMPRESSION: Single IUP similar to that seen on the previous day with fetal pole although no discernible cardiac activity is identified. Findings are suspicious but not yet definitive for failed pregnancy. Recommend follow-up US in 10-14 days for definitive diagnosis. This recommendation follows SRU consensus guidelines: Diagnostic Criteria for Nonviable Pregnancy Early in the  First Trimester. Malva Limes Med 2013; 761:9509-32. Electronically Signed   By: Alcide Clever M.D.   On: 02/13/2021 21:44   US OB LESS THAN 14 WEEKS WITH OB TRANSVAGINAL  Result Date: 02/12/2021 CLINICAL DATA:  Initial evaluation for acute vaginal bleeding, pain, early pregnancy. EXAM: OBSTETRIC <14 WK [place] AND TRANSVAGINAL OB US TECHNIQUE: Both transabdominal and transvaginal ultrasound examinations were performed for complete evaluation of the gestation as well as the maternal uterus, adnexal regions, and pelvic cul-de-sac. Transvaginal technique was performed to assess early pregnancy. COMPARISON:  None. FINDINGS: Intrauterine gestational sac: Single Yolk sac:  Present Embryo:  Present Cardiac Activity: Negative. Heart Rate: N/A CRL: 3.9 mm   6 w   0 d                  [place] EDC: 10/08/2021 Subchorionic hemorrhage: Small subchorionic hemorrhage without associated mass effect. Maternal uterus/adnexae: Ovaries within normal limits bilaterally. Small degenerating corpus luteal cyst noted within the right ovary. No adnexal mass or free fluid. IMPRESSION: 1. Single IUP with embryo, crown-rump length measures 3.9 mm, but no detectable cardiac activity. Findings are suspicious but not yet definitive for failed pregnancy. Recommend follow-up US in 10-14 days for definitive diagnosis. This recommendation follows SRU consensus guidelines: Diagnostic Criteria for Nonviable Pregnancy Early in the First Trimester. Malva Limes Med 2013; 671:2458-09. 2. Small subchorionic hemorrhage without mass effect. 3. No other acute maternal uterine or adnexal abnormality. Electronically Signed   By: Rise Mu M.D.   On: 02/12/2021 23:31   MAU Course  Procedures  MDM -VB on pad evaluated in room, small amount of bleeding on pad, patient reports this is a normal amount for her -Flexeril given for pain, pt reports pain originally 7/10, now improved per pt -[place]: "Findings consistent with failed first trimester pregnancy. Thickened  endometrial stripe without other findings to suggest retained products of conception." -called Dr. Claudine Mouton, who was in delivery at the time, secure chat message sent to alert to findings and request to change appointment type for May 4th visit to SAB f/u -CBC and hCG drawn before discharge -pt discharged to home in stable condition  Orders Placed This Encounter  Procedures  . US OB Transvaginal    Standing Status:   Standing  Number of Occurrences:   1    Order Specific Question:   Symptom/Reason for Exam    Answer:   Vaginal bleeding in pregnancy [[id number]]    Order Specific Question:   Symptom/Reason for Exam    Answer:   Pregnancy with uncertain fetal viability [[id number]][[id number]]  . Urinalysis, Routine w reflex microscopic Urine, Clean Catch    Standing Status:   Standing    Number of Occurrences:   1  . CBC    Standing Status:   Standing    Number of Occurrences:   1  . hCG, quantitative, pregnancy    Standing Status:   Standing    Number of Occurrences:   1  . Discharge patient    Order Specific Question:   Discharge disposition    Answer:   01-Home or Self Care [1]    Order Specific Question:   Discharge patient date    Answer:   02/18/2021   Meds ordered this encounter  Medications  . cyclobenzaprine (FLEXERIL) tablet 10 mg   Assessment and Plan   1. Miscarriage   2. Vaginal bleeding in pregnancy   3. Pregnancy with uncertain fetal viability   4. Blood type, Rh positive    Allergies as of 02/18/2021      Reactions   Shrimp [shellfish Allergy] Nausea And Vomiting, Rash   PT states she also gets a fever       Medication List    TAKE these medications   acetaminophen 500 MG tablet Commonly known as: TYLENOL Take 1,000 mg by mouth every 6 (six) hours as needed.   cyclobenzaprine 10 MG tablet Commonly known as: FLEXERIL Take 1 tablet (10 mg total) by mouth 2 (two) times daily as needed for muscle spasms.   metroNIDAZOLE 500 MG tablet Commonly known as: Flagyl Take 1  tablet (500 mg total) by mouth 2 (two) times daily for 7 days.   ondansetron 4 MG disintegrating tablet Commonly known as: Zofran ODT Take 1 tablet (4 mg total) by mouth every 6 (six) hours as needed for nausea.   prenatal multivitamin Tabs tablet Take 1 tablet by mouth daily at 12 noon.   promethazine 25 MG tablet Commonly known as: PHENERGAN Take 1 tablet (25 mg total) by mouth every 6 (six) hours as needed for nausea or vomiting.      -f/u with OB as planned on 03/02/2021 -discussed expected s/sx of SAB -return MAU precautions given -pt discharged to home in stable condition  Joni Reiningicole E Ghalia Reicks 02/18/2021, 7:42 PM

## 2021-02-18 NOTE — MAU Note (Signed)
Presents with c/o lower back and pelvic pain that began 1430 this afternoon.  Took Tylenol (extra strength) @ 1430, no relief.  Reports has known subchorionic hemorrhage and currently having VB & wearing a pad.  Also wants to know if miscarrying pregnancy.

## 2021-02-18 NOTE — Discharge Instructions (Signed)
Miscarriage A miscarriage is the loss of pregnancy before the 20th week. Most miscarriages happen during the first 3 months of pregnancy. Sometimes, a miscarriage can happen before a woman knows that she is pregnant. Having a miscarriage can be an emotional experience. If you have had a miscarriage, talk with your health care provider about any questions you may have about the loss of your baby, the grieving process, and your plans for future pregnancy. What are the causes? Many times, the cause of a miscarriage is not known. What increases the risk? The following factors may make a pregnant woman more likely to have a miscarriage: Certain medical conditions  Conditions that affect the hormone balance in the body, such as thyroid disease or polycystic ovary syndrome.  Diabetes.  Autoimmune disorders.  Infections.  Bleeding disorders.  Obesity. Lifestyle factors  Using products with tobacco or nicotine in them or being exposed to tobacco smoke.  Having alcohol.  Having large amounts of caffeine.  Recreational drug use. Problems with reproductive organs or structures  Cervical insufficiency. This is when the lowest part of the uterus (cervix) opens and thins before pregnancy is at term.  Having a condition called Asherman syndrome. This syndrome causes scarring in the uterus or causes the uterus to be abnormal in structure.  Fibrous growths, called fibroids, in the uterus.  Congenital abnormalities. These problems are present at birth.  Infection of the cervix or uterus. Personal or medical history  Injury (trauma).  Having had a miscarriage before.  Being younger than age 18 or older than age 35.  Exposure to harmful substances in the environment. This may include radiation or heavy metals, such as lead.  Use of certain medicines. What are the signs or symptoms? Symptoms of this condition include:  Vaginal bleeding or spotting, with or without cramps or  pain.  Pain or cramping in the abdomen or lower back.  Fluid or tissue coming out of the vagina. How is this diagnosed? This condition may be diagnosed based on:  A physical exam.  Ultrasound.  Lab tests, such as blood tests, urine tests, or swabs for infection. How is this treated? Treatment for a miscarriage is sometimes not needed if all the pregnancy tissue that was in the uterus comes out on its own, and there are no other problems such as infection or heavy bleeding. In other cases, this condition may be treated with:  Dilation and curettage (D&C). In this procedure, the cervix is stretched open and any remaining pregnancy tissue is removed from the lining of the uterus (endometrium).  Medicines. These may include: ? Antibiotic medicine, to treat infection. ? Medicine to help any remaining pregnancy tissue come out of the body. ? Medicine to reduce (contract) the size of the uterus. These medicines may be given if there is a lot of bleeding. If you have Rh-negative blood, you may be given an injection of a medicine called Rho(D) immune globulin. This medicine helps prevent problems with future pregnancies. Follow these instructions at home: Medicines  Take over-the-counter and prescription medicines only as told by your health care provider.  If you were prescribed antibiotic medicine, take it as told by your health care provider. Do not stop taking the antibiotic even if you start to feel better. Activity  Rest as told by your health care provider. Ask your health care provider what activities are safe for you.  Have someone help with home and family responsibilities during this time. General instructions  Monitor how much tissue   or blood clot material comes out of the vagina.  Do not have sex, douche, or put anything, such as tampons, in your vagina until your health care provider says it is okay.  To help you and your partner with the grieving process, talk with your  health care provider or get counseling.  When you are ready, meet with your health care provider to discuss any important steps you should take for your health. Also, discuss steps you should take to have a healthy pregnancy in the future.  Keep all follow-up visits. This is important.   Where to find more information  The [employer] of Obstetricians and Gynecologists: acog.org  U.S. Department of Health and Programmer, systems of Women's Health: EverydayCosmetics.no Contact a health care provider if:  You have a fever or chills.  There is bad-smelling fluid coming from the vagina.  You have more bleeding instead of less.  Tissue or blood clots come out of your vagina. Get help right away if:  You have severe cramps or pain in your back or abdomen.  Heavy bleeding soaks through 2 large sanitary pads an hour for more than 2 hours.  You become light-headed or weak.  You faint.  You feel sad, and your sadness takes over your thoughts.  You think about hurting yourself. If you ever feel like you may hurt yourself or others, or have thoughts about taking your own life, get help right away. Go to your nearest emergency department or:  Call your local emergency services (911 in the [place]  Call a suicide crisis helpline, such as the [place] [facility] at 805-271-1892. This is open 24 hours a day in the [place]  Text the Crisis Text Line at 754 719 2970 (in the [place].). Summary  Most miscarriages happen in the first 3 months of pregnancy. Sometimes miscarriage happens before a woman knows that she is pregnant.  Follow instructions from your health care provider about medicines and activity.  To help you and your partner with grieving, talk with your health care provider or get counseling.  Keep all follow-up visits. This information is not intended to replace advice given to you by your health care provider. Make sure you discuss any questions you  have with your health care provider. Document Revised: 04/16/2020 Document Reviewed: 04/16/2020 Elsevier Patient Education  2021 [employer].         Managing Pregnancy Loss Pregnancy loss can happen any time during a pregnancy. Often the cause is not known. It is rarely because of anything you did. Pregnancy loss in early pregnancy (during the first trimester) is called a miscarriage. This type of pregnancy loss is the most common. Pregnancy loss that happens after 20 weeks of pregnancy is called fetal demise if the baby's heart stops beating before birth. Fetal demise is much less common. Some women experience spontaneous labor shortly after fetal demise resulting in a stillborn birth (stillbirth). Any pregnancy loss can be devastating. You will need to recover both physically and emotionally. Most women are able to get pregnant again after a pregnancy loss and deliver a healthy baby. How to manage emotional recovery Pregnancy loss is very hard emotionally. You may feel many different emotions while you grieve. You may feel sad and angry. You may also feel guilty. It is normal to have periods of crying. Emotional recovery can take longer than physical recovery. It is different for everyone. Taking these steps can help you in managing this loss:  Remember that it is  unlikely you did anything to cause the pregnancy loss.  Share your thoughts and feelings with friends, family, and your partner. Remember that your partner is also recovering emotionally.  Make sure you have a good support system. Do not spend too much time alone.  Meet with a pregnancy loss counselor or join a pregnancy loss support group.  Get enough sleep and eat a healthy diet. Return to regular exercise when you have recovered physically.  Do not use drugs or alcohol to manage your emotions.  Consider seeing a mental health professional to help you recover emotionally.  Ask a friend or loved one to help you decide  what to do with any clothing and nursery items you received for your baby. In the case of a stillbirth, many women benefit from taking additional steps in the grieving process. You may want to:  Hold your baby after the birth.  Name your baby.  Request a birth certificate.  Create a keepsake such as handprints or footprints.  Dress your baby and have a picture taken.  Make funeral arrangements.  Ask for a baptism or blessing. Hospitals have staff members who can help you with all these arrangements.   How to recognize emotional stress It is normal to have emotional stress after a pregnancy loss. But emotional stress that lasts a long time or becomes severe requires treatment. Watch out for these signs of severe emotional stress:  Sadness, anger, or guilt that is not going away and is interfering with your normal activities.  Relationship problems that have occurred or gotten worse since the pregnancy loss.  Signs of depression that last longer than 2 weeks. These may include: ? Sadness. ? Anxiety. ? Hopelessness. ? Loss of interest in activities you enjoy. ? Inability to concentrate. ? Trouble sleeping or sleeping too much. ? Loss of appetite or overeating. ? Thoughts of death or of hurting yourself. Follow these instructions at home:  Take over-the-counter and prescription medicines only as told by your health care provider.  Rest at home until your energy level returns. Return to your normal activities as told by your health care provider. Ask your health care provider what activities are safe for you.  When you are ready, meet with your health care provider to discuss steps to take for a future pregnancy.  Keep all follow-up visits as told by your health care provider. This is important. Where to find support  To help you and your partner with the process of grieving, talk with your health care provider or seek counseling.  Consider meeting with others who have  experienced pregnancy loss. Ask your health care provider about support groups and resources. Where to find more information  U.S. Department of Health and Programmer, systems on Women's Health: [place]Signs.tn  American Pregnancy Association: www.americanpregnancy.org Contact a health care provider if:  You continue to experience grief, sadness, or lack of motivation for everyday activities, and those feelings do not improve over time.  You are struggling to recover emotionally, especially if you are using alcohol or substances to help. Get help right away if:  You have thoughts of hurting yourself or others. If you ever feel like you may hurt yourself or others, or have thoughts about taking your own life, get help right away. You can go to your nearest emergency department or call:  Your local emergency services (911 in the [place]  A suicide crisis helpline, such as the [facility] at 414-184-1898. This is open 24  hours a day. Summary  Any pregnancy loss can be difficult physically and emotionally.  You may experience many different emotions while you grieve. Emotional recovery can last longer than physical recovery.  It is normal to have emotional stress after a pregnancy loss. But emotional stress that lasts a long time or becomes severe requires treatment.  See your health care provider if you are struggling emotionally after a pregnancy loss. This information is not intended to replace advice given to you by your health care provider. Make sure you discuss any questions you have with your health care provider. Document Revised: 02/05/2019 Document Reviewed: 12/27/2017 Elsevier Patient Education  2021 [employer].

## 2021-03-25 ENCOUNTER — Emergency Department (HOSPITAL_COMMUNITY): Payer: Medicaid Other

## 2021-03-25 ENCOUNTER — Other Ambulatory Visit: Payer: Self-pay

## 2021-03-25 ENCOUNTER — Emergency Department (HOSPITAL_COMMUNITY)
Admission: EM | Admit: 2021-03-25 | Discharge: 2021-03-25 | Disposition: A | Discharge status: Home / Self Care | Payer: Medicaid Other | Attending: Emergency Medicine | Admitting: Emergency Medicine

## 2021-03-25 DIAGNOSIS — R1011 Right upper quadrant pain: Secondary | ICD-10-CM | POA: Diagnosis present

## 2021-03-25 DIAGNOSIS — E349 Endocrine disorder, unspecified: Secondary | ICD-10-CM

## 2021-03-25 DIAGNOSIS — R748 Abnormal levels of other serum enzymes: Secondary | ICD-10-CM | POA: Diagnosis not present

## 2021-03-25 DIAGNOSIS — K8050 Calculus of bile duct without cholangitis or cholecystitis without obstruction: Secondary | ICD-10-CM | POA: Insufficient documentation

## 2021-03-25 DIAGNOSIS — R7401 Elevation of levels of liver transaminase levels: Secondary | ICD-10-CM | POA: Diagnosis not present

## 2021-03-25 LAB — CBC WITH DIFFERENTIAL/PLATELET
Abs Immature Granulocytes: 0.05 10*3/uL (ref 0.00–0.07)
Basophils Absolute: 0 10*3/uL (ref 0.0–0.1)
Basophils Relative: 0 %
Eosinophils Absolute: 0.3 10*3/uL (ref 0.0–0.5)
Eosinophils Relative: 2 %
HCT: 39.3 % (ref 36.0–46.0)
Hemoglobin: 12.7 g/dL (ref 12.0–15.0)
Immature Granulocytes: 0 %
Lymphocytes Relative: 19 %
Lymphs Abs: 2.6 10*3/uL (ref 0.7–4.0)
MCH: 28.1 pg (ref 26.0–34.0)
MCHC: 32.3 g/dL (ref 30.0–36.0)
MCV: 86.9 fL (ref 80.0–100.0)
Monocytes Absolute: 0.6 10*3/uL (ref 0.1–1.0)
Monocytes Relative: 5 %
Neutro Abs: 10 10*3/uL — ABNORMAL HIGH (ref 1.7–7.7)
Neutrophils Relative %: 74 %
Platelets: 297 10*3/uL (ref 150–400)
RBC: 4.52 MIL/uL (ref 3.87–5.11)
RDW: 13.3 % (ref 11.5–15.5)
WBC: 13.6 10*3/uL — ABNORMAL HIGH (ref 4.0–10.5)
nRBC: 0 % (ref 0.0–0.2)

## 2021-03-25 LAB — COMPREHENSIVE METABOLIC PANEL WITH GFR
ALT: 28 U/L (ref 0–44)
AST: 49 U/L — ABNORMAL HIGH (ref 15–41)
Albumin: 3.7 g/dL (ref 3.5–5.0)
Alkaline Phosphatase: 60 U/L (ref 38–126)
Anion gap: 10 (ref 5–15)
BUN: 17 mg/dL (ref 6–20)
CO2: 22 mmol/L (ref 22–32)
Calcium: 9.4 mg/dL (ref 8.9–10.3)
Chloride: 106 mmol/L (ref 98–111)
Creatinine, Ser: 0.8 mg/dL (ref 0.44–1.00)
GFR, Estimated: >60 mL/min (ref >60)
Glucose, Bld: 139 mg/dL — ABNORMAL HIGH (ref 70–99)
Potassium: 4 mmol/L (ref 3.5–5.1)
Sodium: 138 mmol/L (ref 135–145)
Total Bilirubin: 0.9 mg/dL (ref 0.3–1.2)
Total Protein: 7.3 g/dL (ref 6.5–8.1)

## 2021-03-25 LAB — I-STAT BETA HCG BLOOD, ED (MC, WL, AP ONLY): I-stat hCG, quantitative: 93.8 m[IU]/mL — ABNORMAL HIGH (ref <5)

## 2021-03-25 LAB — URINALYSIS, ROUTINE W REFLEX MICROSCOPIC
Bilirubin Urine: NEGATIVE
Glucose, UA: NEGATIVE mg/dL
Hgb urine dipstick: NEGATIVE
Ketones, ur: 5 mg/dL — AB
Leukocytes,Ua: NEGATIVE
Nitrite: NEGATIVE
Protein, ur: NEGATIVE mg/dL
Specific Gravity, Urine: 1.029 (ref 1.005–1.030)
pH: 5 (ref 5.0–8.0)

## 2021-03-25 LAB — LIPASE, BLOOD: Lipase: 217 U/L — ABNORMAL HIGH (ref 11–51)

## 2021-03-25 LAB — HCG, QUANTITATIVE, PREGNANCY: hCG, Beta Chain, Quant, S: 111 m[IU]/mL — ABNORMAL HIGH (ref <5)

## 2021-03-25 MED ORDER — FENTANYL CITRATE (PF) 100 MCG/2ML IJ SOLN: 100.0000 ug | Freq: Once | INTRAMUSCULAR | Status: DC

## 2021-03-25 MED ORDER — HYDROMORPHONE HCL 1 MG/ML IJ SOLN
1.0000 mg | Freq: Once | INTRAMUSCULAR | Status: DC
  Filled 2021-03-25: qty 1

## 2021-03-25 MED ORDER — METOCLOPRAMIDE HCL 5 MG/ML IJ SOLN
10.0000 mg | Freq: Once | INTRAMUSCULAR | Status: AC
  Administered 2021-03-25: 10 mg via INTRAVENOUS
  Filled 2021-03-25: qty 2

## 2021-03-25 NOTE — ED Provider Notes (Signed)
[facility] EMERGENCY DEPARTMENT Provider Note   CSN: [id number] Arrival date & time: 03/25/21  0324     History Chief Complaint  Patient presents with  . Abdominal Pain  . Back Pain    Kayla [name] is a 25 y.o. female with history of biliary colic, anemia who presents to the emergency department by EMS with a chief complaint of abdominal pain.  The patient endorses right upper quadrant abdominal pain, constant, 10 out of 10, sharp and cramping, that radiates around her bilateral upper abdomen and back.  She has a history of biliary colic and states that the symptoms feel similar to previous episodes of biliary colic.  She has not had a flareup of her symptoms since April.  She is scheduled for cholecystectomy on June 6.  She reports associated nausea and multiple episodes of vomiting.  She attempted to take a dose of Flexeril, which she was previously given during an ER visit for biliary colic, with no improvement in her symptoms.  She was actively vomiting on arrival to the ER.  She denies fever, chills, cough, chest pain, shortness of breath, dysuria, hematuria, vaginal bleeding, discharge, diarrhea, constipation.  The patient had a miscarriage in April.  The history is provided by the patient and medical records. No language interpreter was used.       Past Medical History:  Diagnosis Date  . Anemia    pregnancy  . Gallstones 10/2020  . Headache     Patient Active Problem List   Diagnosis Date Noted  . Post-dates pregnancy 05/06/2020    Past Surgical History:  Procedure Laterality Date  . NO PAST SURGERIES       OB History    Gravida  2   Para  1   Term  1   Preterm  0   AB  0   Living  1     SAB  0   IAB  0   Ectopic  0   Multiple  0   Live Births  1           Family History  Problem Relation Age of Onset  . Hypertension Mother   . Other Mother        lymphodema  . Hypertension Maternal Grandmother   .  Stroke Maternal Grandmother   . Hypertension Maternal Grandfather   . Other Maternal Grandfather        blood clotting disorder    Social History   Tobacco Use  . Smoking status: Never Smoker  . Smokeless tobacco: Never Used  Vaping Use  . Vaping Use: Never used  Substance Use Topics  . Alcohol use: Not Currently  . Drug use: Never    Home Medications Prior to Admission medications   Medication Sig Start Date End Date Taking? Authorizing Provider  acetaminophen (TYLENOL) 500 MG tablet Take 1,500 mg by mouth every 6 (six) hours as needed for moderate pain or headache.   Yes [provider]  cyclobenzaprine (FLEXERIL) 10 MG tablet Take 1 tablet (10 mg total) by mouth 2 (two) times daily as needed for muscle spasms. 01/15/21  Yes Rogers, Veronica C, CNM  OVER THE COUNTER MEDICATION Take 3 capsules by mouth daily. HUMS stool softener/laxative   Yes [provider]  promethazine (PHENERGAN) 25 MG tablet Take 1 tablet (25 mg total) by mouth every 6 (six) hours as needed for nausea or vomiting. Patient not taking: No sig reported 01/13/21 03/25/21  Williams, Marie   L, CNM    Allergies    Shrimp [shellfish allergy]  Review of Systems   Review of Systems  Constitutional: Negative for activity change, chills and fever.  HENT: Negative for congestion and sore throat.   Eyes: Negative for visual disturbance.  Respiratory: Negative for shortness of breath.   Cardiovascular: Negative for chest pain and palpitations.  Gastrointestinal: Positive for abdominal pain, nausea and vomiting. Negative for blood in stool, constipation and diarrhea.  Genitourinary: Negative for dysuria, flank pain, hematuria, urgency, vaginal bleeding and vaginal pain.  Musculoskeletal: Negative for back pain, joint swelling, myalgias, neck pain and neck stiffness.  Skin: Negative for rash.  Allergic/Immunologic: Negative for immunocompromised state.  Neurological: Negative for seizures, syncope,  weakness, numbness and headaches.  Psychiatric/Behavioral: Negative for confusion.    Physical Exam Updated Vital Signs BP 118/83 (BP Location: Right Arm)   Pulse 98   Temp 97.7 F (36.5 C) (Oral)   Resp 20   LMP 12/08/2020   SpO2 100%   Physical Exam Vitals and nursing note reviewed.  Constitutional:      General: She is not in acute distress.    Comments: Writhing around on the bed.  Tearful.  HENT:     Head: Normocephalic.  Eyes:     Conjunctiva/sclera: Conjunctivae normal.  Cardiovascular:     Rate and Rhythm: Normal rate and regular rhythm.     Pulses: Normal pulses.     Heart sounds: Normal heart sounds. No murmur heard. No friction rub. No gallop.   Pulmonary:     Effort: Pulmonary effort is normal. No respiratory distress.     Breath sounds: No stridor. No wheezing, rhonchi or rales.  Chest:     Chest wall: No tenderness.  Abdominal:     General: There is no distension.     Palpations: Abdomen is soft. There is no mass.     Tenderness: There is abdominal tenderness. There is guarding. There is no right CVA tenderness, left CVA tenderness or rebound.     Hernia: No hernia is present.     Comments: Tender to palpation in the epigastric region and right upper quadrant.  She does not have a definitive Murphy sign.  No CVA tenderness bilaterally.  Abdomen is soft and nondistended.  Musculoskeletal:     Cervical back: Neck supple.  Skin:    General: Skin is warm.     Findings: No rash.  Neurological:     Mental Status: She is alert.  Psychiatric:        Behavior: Behavior normal.     ED Results / Procedures / Treatments   Labs (all labs ordered are listed, but only abnormal results are displayed) Labs Reviewed  CBC WITH DIFFERENTIAL/PLATELET - Abnormal; Notable for the following components:      Result Value   WBC 13.6 (*)    Neutro Abs 10.0 (*)    All other components within normal limits  COMPREHENSIVE METABOLIC PANEL - Abnormal; Notable for the  following components:   Glucose, Bld 139 (*)    AST 49 (*)    All other components within normal limits  LIPASE, BLOOD - Abnormal; Notable for the following components:   Lipase 217 (*)    All other components within normal limits  URINALYSIS, ROUTINE W REFLEX MICROSCOPIC - Abnormal; Notable for the following components:   Ketones, ur 5 (*)    All other components within normal limits  HCG, QUANTITATIVE, PREGNANCY - Abnormal; Notable for the following components:     hCG, Beta Chain, Quant, S 111 (*)    All other components within normal limits  I-STAT BETA HCG BLOOD, ED ([facility], [facility], [facility] ONLY) - Abnormal; Notable for the following components:   I-stat hCG, quantitative 93.8 (*)    All other components within normal limits    EKG None  Radiology US Abdomen Limited RUQ (LIVER/GB)  Result Date: 03/25/2021 CLINICAL DATA:  Elevated liver enzymes EXAM: ULTRASOUND ABDOMEN LIMITED RIGHT UPPER QUADRANT COMPARISON:  01/13/2021 FINDINGS: Gallbladder: Numerous shadowing gallstones. No wall thickening or focal tenderness. No pericholecystic edema. Common bile duct: Diameter: 5 mm.  Where visualized, no filling defect. Liver: Negative for liver mass. Portal vein is patent on color Doppler imaging with normal direction of blood flow towards the liver. IMPRESSION: Multiple gallstones. No evidence of acute cholecystitis or ductal obstruction. Electronically Signed   By: Jonathon  Watts M.D.   On: 03/25/2021 05:07    Procedures Procedures   Medications Ordered in ED Medications  HYDROmorphone (DILAUDID) injection 1 mg (1 mg Intravenous Patient Refused/Not Given 03/25/21 0415)  metoCLOPramide (REGLAN) injection 10 mg (10 mg Intravenous Given 03/25/21 0416)    ED Course  I have reviewed the triage vital signs and the nursing notes.  Pertinent labs & imaging results that were available during my care of the patient were reviewed by me and considered in my medical decision making (see chart for details).     MDM Rules/Calculators/A&P                          24-year-old female with history of biliary colic, anemia who presents the emergency department by EMS with right upper quadrant abdominal pain, nausea, and vomiting that began approximately 30 minutes prior to arrival.  Patient has a known history of biliary colic and is scheduled for cholecystectomy on June 6.  States that her symptoms feel similar to previous episodes of biliary colic.  Vital signs are stable.  Will order labs, Reglan, and Dilaudid for pain control and reassess.  Labs of been reviewed and independently interpreted by me. She has a leukocytosis of 13.  AST is minimally elevated at 49.  ALT, alk phos, and total bilirubin are normal.  Lipase is elevated at 217.  UA with minimal ketonuria, but no evidence of infection.  Repeat right upper quadrant ultrasound was obtained as there was concern for gallstone pancreatitis.  Gallstones noted on right upper quadrant ultrasound but no evidence of cholecystitis or choledocholithiasis.  On repeat exam, abdomen with benign and nontender.  Of note, her initial i-STAT hCG was 93.8.  Beta hCG is 111.  This is down from ~400 1 month ago.  However question if patient has retained products of conception from recent miscarriage or has any pregnancy.  She does report that she has been sexually active since her miscarriage, but reports that she and her partner have used condoms every time for birth control.  She denies any condom malfunction.  On repeat exam, her abdomen is soft and nontender.  She has no pelvic tenderness.  No indication for pelvic exam or urgent OB/GYN consult at this time.  She will follow in the office in the next 2 to 3 days to have a repeat hCG.  In regards to her elevated lipase, abdomen is benign and she has tolerating fluids without difficulty.  Suspect biliary colic. No indication for CT imaging to assess for pancreatitis.  ER return precautions given.  She is hemodynamically  stable no   acute distress.  The patient was discussed with Dr. Dayna Barker, attending physician.  Safe for discharge home with outpatient follow-up as discussed.  Final Clinical Impression(s) / ED Diagnoses Final diagnoses:  Elevated transaminase level  Biliary colic  Elevated serum hCG    Rx / DC Orders ED Discharge Orders    None       Joanne Gavel, PA-C 03/25/21 0849    Mesner, Corene Cornea, MD 03/30/21 0236

## 2021-03-25 NOTE — ED Notes (Signed)
Patient given discharge instructions. Questions were answered. Patient verbalized understanding of discharge instructions and care at home.  

## 2021-03-25 NOTE — Discharge Instructions (Signed)
Thank you for allowing me to care for you today in the Emergency Department.   Please call your OB/GYN's office to schedule a follow-up appointment in the next 2 to 3 days to have your hCG levels rechecked in the office since they were elevated today.  You can take your home Flexeril and nausea medication as prescribed if you have recurrent symptoms.  Return the emergency department if you have uncontrollable abdominal pain, persistent vomiting despite taking nausea medicine, if you start having fever and severe abdominal pain, severe pelvic pain, uncontrollable vaginal bleeding, or other new, concerning symptoms.

## 2021-03-25 NOTE — ED Notes (Signed)
Pt completed po challenge with no nausea/vomiting.

## 2021-03-25 NOTE — ED Triage Notes (Signed)
Patient arrives via EMS from home due to severe abd pain and back spasms. Patient was recently diagnosed with gallstones and she has surgery scheduled for gallbladder removal next month.   Patient took a muscle relaxant at home with no relief. Rates pain a 10/10. Actively vomiting on arrival.

## 2021-03-29 ENCOUNTER — Ambulatory Visit: Payer: Self-pay | Admitting: Surgery

## 2021-03-29 ENCOUNTER — Encounter (HOSPITAL_COMMUNITY): Payer: Self-pay | Admitting: *Deleted

## 2021-03-29 ENCOUNTER — Encounter (HOSPITAL_COMMUNITY): Payer: Self-pay | Admitting: Physician Assistant

## 2021-03-29 NOTE — Progress Notes (Signed)
Spoke with patient about elevated HCG levels.  Patient was seen in the ED on 5/27 and MD recommended patient have rechecked HCG levels before her surgery on 6/6.  Confirmed that patient is at her primary Care physician now and has an appt with her OBGYN later today,

## 2021-03-29 NOTE — Progress Notes (Signed)
Surgical Instructions    Your procedure is scheduled on June 6  Report to [facility] Main Entrance "A" at 0530 A.M., then check in with the Admitting office.  Call this number if you have problems the morning of surgery:  [phone]   If you have any questions prior to your surgery date call [phone]: Open Monday-Friday 8am-4pm    Remember:  Do not eat after midnight the night before your surgery  You may drink clear liquids until 0430 the morning of your surgery.   Clear liquids allowed are: Water, Non-Citrus Juices (without pulp), Carbonated Beverages, Clear Tea, Black Coffee Only, and Gatorade    Take these medicines the morning of surgery with A SIP OF WATER  acetaminophen (TYLENOL) cyclobenzaprine (FLEXERIL) if needed   As of today, STOP taking any Aspirin (unless otherwise instructed by your surgeon) Aleve, Naproxen, Ibuprofen, Motrin, Advil, Goody's, BC's, all herbal medications, fish oil, and all vitamins.                     Do not wear jewelry, make up, or nail polish DO Not wear nail polish, gel polish, artificial nails, or any other type of covering on natural  nails including finger and toenails. If patients have artificial nails, gel coating, etc. that need to be removed by a nail saloon please have this removed prior to surgery or surgery may need to be canceled/delayed if the surgeon/ anesthesia feels like the patient is unable to be adequately monitored.            Do not wear lotions, powders, perfumes/colognes, or deodorant.            Do not shave 48 hours prior to surgery.  Men may shave face and neck.            Do not bring valuables to the hospital.            [facility] is not responsible for any belongings or valuables.  Do NOT Smoke (Tobacco/Vaping) or drink Alcohol 24 hours prior to your procedure If you use a CPAP at night, you may bring all equipment for your overnight stay.   Contacts, glasses, dentures or bridgework may not be worn into  surgery, please bring cases for these belongings   For patients admitted to the hospital, discharge time will be determined by your treatment team.   Patients discharged the day of surgery will not be allowed to drive home, and someone needs to stay with them for 24 hours.    Special instructions:    Oral Hygiene is also important to reduce your risk of infection.  Remember - BRUSH YOUR TEETH THE MORNING OF SURGERY WITH YOUR REGULAR TOOTHPASTE   [place]- Preparing For Surgery  Before surgery, you can play an important role. Because skin is not sterile, your skin needs to be as free of germs as possible. You can reduce the number of germs on your skin by washing with CHG (chlorahexidine gluconate) Soap before surgery.  CHG is an antiseptic cleaner which kills germs and bonds with the skin to continue killing germs even after washing.     Please do not use if you have an allergy to CHG or antibacterial soaps. If your skin becomes reddened/irritated stop using the CHG.  Do not shave (including legs and underarms) for at least 48 hours prior to first CHG shower. It is OK to shave your face.  Please follow these instructions carefully.  1.  Shower the NIGHT BEFORE SURGERY and the MORNING OF SURGERY with CHG Soap.   If you chose to wash your hair, wash your hair first as usual with your normal shampoo. After you shampoo, rinse your hair and body thoroughly to remove the shampoo.  Then [employer] and genitals (private parts) with your normal soap and rinse thoroughly to remove soap.  2. After that Use CHG Soap as you would any other liquid soap. You can apply CHG directly to the skin and wash gently with a scrungie or a clean washcloth.   3. Apply the CHG Soap to your body ONLY FROM THE NECK DOWN.  Do not use on open wounds or open sores. Avoid contact with your eyes, ears, mouth and genitals (private parts). Wash Face and genitals (private parts)  with your normal soap.   4. Wash  thoroughly, paying special attention to the area where your surgery will be performed.  5. Thoroughly rinse your body with warm water from the neck down.  6. DO NOT shower/wash with your normal soap after using and rinsing off the CHG Soap.  7. Pat yourself dry with a CLEAN TOWEL.  8. Wear CLEAN PAJAMAS to bed the night before surgery  9. Place CLEAN SHEETS on your bed the night before your surgery  10. DO NOT SLEEP WITH PETS.   Day of Surgery:  Take a shower with CHG soap. Wear Clean/Comfortable clothing the morning of surgery Do not apply any deodorants/lotions.   Remember to brush your teeth WITH YOUR REGULAR TOOTHPASTE.   Please read over the following fact sheets that you were given.

## 2021-03-30 ENCOUNTER — Encounter (HOSPITAL_COMMUNITY)
Admission: RE | Admit: 2021-03-30 | Discharge: 2021-03-30 | Disposition: A | Discharge status: Home / Self Care | Payer: Medicaid Other | Source: Ambulatory Visit | Attending: Surgery | Admitting: Surgery

## 2021-03-30 ENCOUNTER — Encounter (HOSPITAL_COMMUNITY): Payer: Self-pay

## 2021-03-30 ENCOUNTER — Other Ambulatory Visit: Payer: Self-pay

## 2021-03-30 ENCOUNTER — Other Ambulatory Visit: Payer: Self-pay | Admitting: Surgery

## 2021-03-30 DIAGNOSIS — Z01812 Encounter for preprocedural laboratory examination: Secondary | ICD-10-CM | POA: Insufficient documentation

## 2021-03-30 DIAGNOSIS — Z01818 Encounter for other preprocedural examination: Secondary | ICD-10-CM

## 2021-03-30 LAB — BASIC METABOLIC PANEL
Anion gap: 8 (ref 5–15)
BUN: 8 mg/dL (ref 6–20)
CO2: 23 mmol/L (ref 22–32)
Calcium: 9 mg/dL (ref 8.9–10.3)
Chloride: 106 mmol/L (ref 98–111)
Creatinine, Ser: 0.7 mg/dL (ref 0.44–1.00)
GFR, Estimated: >60 mL/min (ref >60)
Glucose, Bld: 100 mg/dL — ABNORMAL HIGH (ref 70–99)
Potassium: 4.1 mmol/L (ref 3.5–5.1)
Sodium: 137 mmol/L (ref 135–145)

## 2021-03-30 LAB — CBC
HCT: 39.5 % (ref 36.0–46.0)
Hemoglobin: 12.4 g/dL (ref 12.0–15.0)
MCH: 27.6 pg (ref 26.0–34.0)
MCHC: 31.4 g/dL (ref 30.0–36.0)
MCV: 87.8 fL (ref 80.0–100.0)
Platelets: 325 10*3/uL (ref 150–400)
RBC: 4.5 MIL/uL (ref 3.87–5.11)
RDW: 13.6 % (ref 11.5–15.5)
WBC: 7.5 10*3/uL (ref 4.0–10.5)
nRBC: 0 % (ref 0.0–0.2)

## 2021-03-30 LAB — HCG, QUANTITATIVE, PREGNANCY: hCG, Beta Chain, Quant, S: 941 m[IU]/mL — ABNORMAL HIGH (ref <5)

## 2021-03-30 NOTE — Progress Notes (Addendum)
PCP - Norva Riffle PA  Chest x-ray - Not indicated EKG - 12/06/20 Stress Test - 2021 no follow up needed  DM - Denies  COVID TEST- NOt indicated  Anesthesia review: Yes currently pregnant. Went to OBGYN on 03/29/21 [facility] Dr. Claiborne Billings.  Called Dr. Delray Alt office to confirm she is aware of the upcoming surgery. Left a message with Lequita Halt at Dr. Delray Alt office 03/30/21 @ 0912.   Spoke to [place] at [facility] to inform Dr. [name] of current pregnancy.  Patient denies shortness of breath, fever, cough and chest pain at PAT appointment   All instructions explained to the patient, with a verbal understanding of the material. Patient agrees to go over the instructions while at home for a better understanding.  The opportunity to ask questions was provided.   03/30/21 1150 Received call back from [place] with [facility].  Relayed to her that patient stated she was pregnant again.  I also gave her the patient's OBGYN name.

## 2021-03-30 NOTE — Progress Notes (Signed)
Surgical Instructions               Your procedure is scheduled on June 6             Report to [facility] Main Entrance "A" at 0530 A.M., then check in with the Admitting office.             Call this number if you have problems the morning of surgery:             [phone]   If you have any questions prior to your surgery date call [phone]: Open Monday-Friday 8am-4pm               Remember:             Do not eat or drink anything after midnight the night before your surgery                          Take these medicines the morning of surgery with A SIP OF WATER  acetaminophen (TYLENOL) cyclobenzaprine (FLEXERIL) if needed   As of today, STOP taking any Aspirin (unless otherwise instructed by your surgeon) Aleve, Naproxen, Ibuprofen, Motrin, Advil, Goody's, BC's, all herbal medications, fish oil, and all vitamins.  Do not wear jewelry, make up, or nail polish DO Not wear nail polish, gel polish, artificial nails, or any other type of covering on natural       nails including finger and toenails. If patients have artificial nails, gel coating, etc. that need to be removed by a nail saloon please have this removed prior to surgery or surgery may need to be canceled/delayed if the surgeon/ anesthesia feels like the patient is unable to be adequately monitored. Do notwear lotions, powders, perfumes, or deodorant. Do notshave 48 hours prior to surgery.  Do notbring valuables to the hospital. [facility] is not responsible for any belongings or valuables.  Do NOT Smoke (Tobacco/Vaping) or drink Alcohol 24 hours prior to your procedure If you use a CPAP at night, you may bring all equipment for your overnight stay.  Contacts, glasses, dentures or bridgework may not be worn into surgery, please bring cases for these belongings  For patients admitted to the hospital, discharge time will be determined by  your treatment team.  Patients discharged the day of surgery will not be allowed to drive home, and someone needs to stay with them for 24 hours.    Special instructions:    Oral Hygiene is also important to reduce your risk of infection.  Remember - BRUSH YOUR TEETH THE MORNING OF SURGERY WITH YOUR REGULAR TOOTHPASTE   [place]- Preparing For Surgery  Before surgery, you can play an important role. Because skin is not sterile, your skin needs to be as free of germs as possible. You can reduce the number of germs on your skin by washing with CHG (chlorahexidine gluconate) Soap before surgery.  CHG is an antiseptic cleaner which kills germs and bonds with the skin to continue killing germs even after washing.     Please do not use if you have an allergy to CHG or antibacterial soaps. If your skin becomes reddened/irritated stop using the CHG.  Do not shave (including legs and underarms) for at least 48 hours prior to first CHG shower. It is OK to shave your face.  Please follow these instructions carefully.  1.  Shower the NIGHT BEFORE SURGERY and the MORNING OF SURGERY with CHG Soap.  If you chose to wash your hair, wash your hair first as usual with your normal shampoo. After you shampoo, rinse your hair and body thoroughly to remove the shampoo.  Then [employer] and genitals (private parts) with your normal soap and rinse thoroughly to remove soap.  2. After that Use CHG Soap as you would any other liquid soap. You can apply CHG directly to the skin and wash gently with a scrungie or a clean washcloth.   3. Apply the CHG Soap to your body ONLY FROM THE NECK DOWN.  Do not use on open wounds or open sores. Avoid contact with your eyes, ears, mouth and genitals (private parts). Wash Face and genitals (private parts)  with your normal soap.    4. Wash thoroughly, paying special attention to the area where your surgery will be performed.  5. Thoroughly rinse your body with warm water from the neck down.  6. DO NOT shower/wash with your normal soap after using and rinsing off the CHG Soap.  7. Pat yourself dry with a CLEAN TOWEL.  8. Wear CLEAN PAJAMAS to bed the night before surgery  9. Place CLEAN SHEETS on your bed the night before your surgery  10. DO NOT SLEEP WITH PETS.   Day of Surgery:  Take a shower with CHG soap. Wear Clean/Comfortable clothing the morning of surgery Do notapply any deodorants/lotions.  Remember to brush your teeth WITH YOUR REGULAR TOOTHPASTE.  Please read over the following fact sheets that you were given.

## 2021-03-30 NOTE — H&P (Signed)
Patient was scheduled for surgery this coming Monday June 6 for symptomatic cholelithiasis. She was initially scheduled during second trimester of a recent pregnancy but had a miscarriage. I was notified today that she was seen in the ED on 5/27 with an elevated hCG. I reached out to Dr. Delray Alt office and she recommended rechecking an hCG to confirm it is trending up. Serum hCG was completed today and is rising. Surgery for next week will be cancelled and rescheduled early in the patient's second trimester. I called and discussed this with the patient. She saw her PCP yesterday who informed her of her pregnancy, and she is planning to make an appointment with her OB.

## 2021-03-31 ENCOUNTER — Other Ambulatory Visit (HOSPITAL_COMMUNITY): Payer: Medicaid Other

## 2021-04-03 ENCOUNTER — Inpatient Hospital Stay (EMERGENCY_DEPARTMENT_HOSPITAL)
Admission: EM | Admit: 2021-04-03 | Discharge: 2021-04-03 | Disposition: A | Discharge status: Home / Self Care | Payer: Medicaid Other | Source: Home / Self Care | Admitting: Obstetrics

## 2021-04-03 ENCOUNTER — Inpatient Hospital Stay (HOSPITAL_COMMUNITY): Payer: Medicaid Other

## 2021-04-03 ENCOUNTER — Other Ambulatory Visit: Payer: Self-pay

## 2021-04-03 ENCOUNTER — Observation Stay (HOSPITAL_COMMUNITY)
Admission: AD | Admit: 2021-04-03 | Discharge: 2021-04-05 | Disposition: A | Discharge status: Home / Self Care | Payer: Medicaid Other | Attending: General Surgery | Admitting: General Surgery

## 2021-04-03 ENCOUNTER — Encounter (HOSPITAL_COMMUNITY): Payer: Self-pay | Admitting: Emergency Medicine

## 2021-04-03 ENCOUNTER — Encounter (HOSPITAL_COMMUNITY): Payer: Self-pay | Admitting: Obstetrics

## 2021-04-03 DIAGNOSIS — Z6790 Unspecified blood type, Rh positive: Secondary | ICD-10-CM | POA: Diagnosis not present

## 2021-04-03 DIAGNOSIS — O26891 Other specified pregnancy related conditions, first trimester: Secondary | ICD-10-CM | POA: Diagnosis present

## 2021-04-03 DIAGNOSIS — R109 Unspecified abdominal pain: Secondary | ICD-10-CM

## 2021-04-03 DIAGNOSIS — O418X10 Other specified disorders of amniotic fluid and membranes, first trimester, not applicable or unspecified: Secondary | ICD-10-CM

## 2021-04-03 DIAGNOSIS — O208 Other hemorrhage in early pregnancy: Secondary | ICD-10-CM | POA: Diagnosis not present

## 2021-04-03 DIAGNOSIS — O209 Hemorrhage in early pregnancy, unspecified: Secondary | ICD-10-CM | POA: Diagnosis not present

## 2021-04-03 DIAGNOSIS — O3680X0 Pregnancy with inconclusive fetal viability, not applicable or unspecified: Secondary | ICD-10-CM | POA: Insufficient documentation

## 2021-04-03 DIAGNOSIS — R1011 Right upper quadrant pain: Secondary | ICD-10-CM

## 2021-04-03 DIAGNOSIS — Z8719 Personal history of other diseases of the digestive system: Secondary | ICD-10-CM | POA: Insufficient documentation

## 2021-04-03 DIAGNOSIS — O219 Vomiting of pregnancy, unspecified: Secondary | ICD-10-CM | POA: Insufficient documentation

## 2021-04-03 DIAGNOSIS — Z3A01 Less than 8 weeks gestation of pregnancy: Secondary | ICD-10-CM | POA: Diagnosis not present

## 2021-04-03 DIAGNOSIS — Z20822 Contact with and (suspected) exposure to covid-19: Secondary | ICD-10-CM | POA: Insufficient documentation

## 2021-04-03 DIAGNOSIS — O26899 Other specified pregnancy related conditions, unspecified trimester: Secondary | ICD-10-CM

## 2021-04-03 DIAGNOSIS — Z3A00 Weeks of gestation of pregnancy not specified: Secondary | ICD-10-CM

## 2021-04-03 DIAGNOSIS — O99611 Diseases of the digestive system complicating pregnancy, first trimester: Secondary | ICD-10-CM

## 2021-04-03 DIAGNOSIS — K8020 Calculus of gallbladder without cholecystitis without obstruction: Secondary | ICD-10-CM

## 2021-04-03 DIAGNOSIS — A749 Chlamydial infection, unspecified: Secondary | ICD-10-CM

## 2021-04-03 DIAGNOSIS — O468X1 Other antepartum hemorrhage, first trimester: Secondary | ICD-10-CM

## 2021-04-03 DIAGNOSIS — K819 Cholecystitis, unspecified: Secondary | ICD-10-CM | POA: Diagnosis present

## 2021-04-03 DIAGNOSIS — K8010 Calculus of gallbladder with chronic cholecystitis without obstruction: Secondary | ICD-10-CM | POA: Insufficient documentation

## 2021-04-03 HISTORY — DX: Chlamydial infection, unspecified: A74.9

## 2021-04-03 LAB — TYPE AND SCREEN
ABO/RH(D): A POS
Antibody Screen: NEGATIVE

## 2021-04-03 LAB — COMPREHENSIVE METABOLIC PANEL
ALT: 52 U/L — ABNORMAL HIGH (ref 0–44)
AST: 62 U/L — ABNORMAL HIGH (ref 15–41)
Albumin: 3.6 g/dL (ref 3.5–5.0)
Alkaline Phosphatase: 58 U/L (ref 38–126)
Anion gap: 7 (ref 5–15)
BUN: 5 mg/dL — ABNORMAL LOW (ref 6–20)
CO2: 23 mmol/L (ref 22–32)
Calcium: 9.1 mg/dL (ref 8.9–10.3)
Chloride: 105 mmol/L (ref 98–111)
Creatinine, Ser: 0.66 mg/dL (ref 0.44–1.00)
GFR, Estimated: >60 mL/min (ref >60)
Glucose, Bld: 91 mg/dL (ref 70–99)
Potassium: 3.8 mmol/L (ref 3.5–5.1)
Sodium: 135 mmol/L (ref 135–145)
Total Bilirubin: 1.3 mg/dL — ABNORMAL HIGH (ref 0.3–1.2)
Total Protein: 7.2 g/dL (ref 6.5–8.1)

## 2021-04-03 LAB — CBC
HCT: 37.8 % (ref 36.0–46.0)
Hemoglobin: 12.3 g/dL (ref 12.0–15.0)
MCH: 27.9 pg (ref 26.0–34.0)
MCHC: 32.5 g/dL (ref 30.0–36.0)
MCV: 85.7 fL (ref 80.0–100.0)
Platelets: 356 10*3/uL (ref 150–400)
RBC: 4.41 MIL/uL (ref 3.87–5.11)
RDW: 13.8 % (ref 11.5–15.5)
WBC: 8.9 10*3/uL (ref 4.0–10.5)
nRBC: 0 % (ref 0.0–0.2)

## 2021-04-03 LAB — WET PREP, GENITAL
Sperm: NONE SEEN
Trich, Wet Prep: NONE SEEN
Yeast Wet Prep HPF POC: NONE SEEN

## 2021-04-03 LAB — LIPASE, BLOOD: Lipase: 92 U/L — ABNORMAL HIGH (ref 11–51)

## 2021-04-03 LAB — HCG, QUANTITATIVE, PREGNANCY: hCG, Beta Chain, Quant, S: 5995 m[IU]/mL — ABNORMAL HIGH (ref <5)

## 2021-04-03 LAB — RESP PANEL BY RT-PCR (FLU A&B, COVID) ARPGX2
Influenza A by PCR: NEGATIVE
Influenza B by PCR: NEGATIVE
SARS Coronavirus 2 by RT PCR: NEGATIVE

## 2021-04-03 LAB — AMYLASE: Amylase: 71 U/L (ref 28–100)

## 2021-04-03 MED ORDER — LACTATED RINGERS IV BOLUS: 1000.0000 mL | Freq: Once | INTRAVENOUS | Status: DC

## 2021-04-03 MED ORDER — HYDROMORPHONE HCL 1 MG/ML IJ SOLN
1.0000 mg | Freq: Once | INTRAMUSCULAR | Status: AC
Start: 2021-04-03 — End: 2021-04-03
  Administered 2021-04-03: 1 mg via INTRAMUSCULAR
  Filled 2021-04-03: qty 1

## 2021-04-03 MED ORDER — SODIUM CHLORIDE 0.9 % IV SOLN
25.0000 mg | Freq: Once | INTRAVENOUS | Status: DC
  Filled 2021-04-03: qty 1

## 2021-04-03 NOTE — MAU Provider Note (Signed)
History     CSN: [id number]  Arrival date and time: 04/03/21 1025   Event Date/Time   First Provider Initiated Contact with Patient 04/03/21 1229      Chief Complaint  Patient presents with  . Abdominal Pain  . Nausea   Kayla [name] is a 25 y.o. G3P1011 at Unknown who presents to MAU for RUQ pain that radiates to her right back and also under her rib cage to the LUQ. Patient rates pain currently as 8/10. Patient reports earlier this morning she was seen in MAU after she experienced an increase in pain around midnight last night, she tried taking Flexeril and Tylenol (1500mg ), which did not work. Patient reports she called EMS around 2AM to be taken to [facility] and requested to come to MAU, but was taken to [facility] instead and was then transferred to MAU. Patient reports after all of this, she felt like her gallbladder was "calming down" and just felt like it was a "slight throbbing" sensation and went home without further treatment because she was feeling better. Patient reports her pain started flaring up right after she got discharged around 5AM this morning, but she went home, took more Flexeril and Tylenol (around 6AM) and reports this did not work for her, so she came back to MAU. Patient reports she was prescribed Flexeril from MAU.  Patient reports issues with gallstones since October 2021 and was scheduled for surgery tomorrow, but the surgery was cancelled because of her pregnancy. Patient reports she was just told to "eat clean" by the surgical center and will schedule appt with general surgery around 2nd trimester to discuss condition. Patient does not have any appointment scheduled at this time for f/u on gallbladder and no plan for pain management.  Pt denies VB, LOF, ctx, decreased FM, vaginal discharge/odor/itching. Pt denies N/V, constipation, diarrhea, or urinary problems. Pt denies fever, chills, fatigue, sweating or changes in appetite. Pt denies SOB or chest pain. Pt  denies dizziness, HA, light-headedness, weakness.  Problems this pregnancy include: PUL. Allergies? Shrimp Current medications/supplements? Flexeril, Tylenol Prenatal care provider? [facility] November 2021, next appt 04/04/2021   OB History    Gravida  3   Para  1   Term  1   Preterm  0   AB  1   Living  1     SAB  1   IAB  0   Ectopic  0   Multiple  0   Live Births  1           Past Medical History:  Diagnosis Date  . Anemia    pregnancy  . Gallstones 10/2020  . Headache     Past Surgical History:  Procedure Laterality Date  . NO PAST SURGERIES      Family History  Problem Relation Age of Onset  . Hypertension Mother   . Other Mother        lymphodema  . Hypertension Maternal Grandmother   . Stroke Maternal Grandmother   . Hypertension Maternal Grandfather   . Other Maternal Grandfather        blood clotting disorder    Social History   Tobacco Use  . Smoking status: Never Smoker  . Smokeless tobacco: Never Used  Vaping Use  . Vaping Use: Never used  Substance Use Topics  . Alcohol use: Not Currently  . Drug use: Never    Allergies:  Allergies  Allergen Reactions  . Shrimp [Shellfish Allergy] Nausea And Vomiting and Rash  PT states she also gets a fever     Medications Prior to Admission  Medication Sig Dispense Refill Last Dose  . acetaminophen (TYLENOL) 500 MG tablet Take 1,500 mg by mouth every 6 (six) hours as needed for moderate pain or headache.   04/03/2021 at 0600  . cyclobenzaprine (FLEXERIL) 10 MG tablet Take 1 tablet (10 mg total) by mouth 2 (two) times daily as needed for muscle spasms. 30 tablet 0 04/03/2021 at 0600  . OVER THE COUNTER MEDICATION Take 3 capsules by mouth daily. HUMS stool softener/laxative   04/02/2021 at Unknown time  . Prenatal Vit-Fe Fumarate-FA (MULTIVITAMIN-PRENATAL) 27-0.8 MG TABS tablet Take 1 tablet by mouth daily at 12 noon.   04/02/2021 at Unknown time    Review of Systems  Constitutional: Negative for  chills, diaphoresis, fatigue and fever.  Eyes: Negative for visual disturbance.  Respiratory: Negative for shortness of breath.   Cardiovascular: Negative for chest pain.  Gastrointestinal: Positive for abdominal pain. Negative for constipation, diarrhea, nausea and vomiting.  Genitourinary: Negative for dysuria, flank pain, frequency, pelvic pain, urgency, vaginal bleeding and vaginal discharge.  Neurological: Negative for dizziness, weakness, light-headedness and headaches.   Physical Exam   Blood pressure 134/87, pulse 97, temperature 98.3 F (36.8 C), temperature source Oral, resp. rate 18, last menstrual period 12/08/2020, SpO2 95 %, unknown if currently breastfeeding.  Patient Vitals for the past 24 hrs:  BP Temp Temp src Pulse Resp SpO2  04/03/21 1505 134/87 98.3 F (36.8 C) Oral 97 18 95 %  04/03/21 1340 -- -- -- -- 18 --  04/03/21 1258 -- -- -- -- 20 --  04/03/21 1200 -- -- -- -- -- 100 %  04/03/21 1155 -- -- -- -- -- 100 %  04/03/21 1152 126/78 -- -- 78 18 --  04/03/21 1059 123/76 98 F (36.7 C) Oral 85 15 --  04/03/21 1056 -- -- -- -- -- 100 %   Physical Exam Vitals and nursing note reviewed.  Constitutional:      General: She is not in acute distress.    Appearance: Normal appearance. She is not ill-appearing, toxic-appearing or diaphoretic.  HENT:     Head: Normocephalic and atraumatic.  Pulmonary:     Effort: Pulmonary effort is normal.  Neurological:     Mental Status: She is alert and oriented to person, place, and time.  Psychiatric:        Mood and Affect: Mood normal.        Behavior: Behavior normal.        Thought Content: Thought content normal.        Judgment: Judgment normal.    Results for orders placed or performed during the hospital encounter of 04/03/21 (from the past 24 hour(s))  hCG, quantitative, pregnancy     Status: Abnormal   Collection Time: 04/03/21 11:20 AM  Result Value Ref Range   hCG, Beta Chain, Quant, S 5,995 (H) <5 mIU/mL   CBC     Status: None   Collection Time: 04/03/21 12:44 PM  Result Value Ref Range   WBC 8.9 4.0 - 10.5 K/uL   RBC 4.41 3.87 - 5.11 MIL/uL   Hemoglobin 12.3 12.0 - 15.0 g/dL   HCT 27.0 62.3 - 76.2 %   MCV 85.7 80.0 - 100.0 fL   MCH 27.9 26.0 - 34.0 pg   MCHC 32.5 30.0 - 36.0 g/dL   RDW 83.1 51.7 - 61.6 %   Platelets 356 150 - 400 K/uL  nRBC 0.0 0.0 - 0.2 %  Comprehensive metabolic panel     Status: Abnormal   Collection Time: 04/03/21 12:44 PM  Result Value Ref Range   Sodium 135 135 - 145 mmol/L   Potassium 3.8 3.5 - 5.1 mmol/L   Chloride 105 98 - 111 mmol/L   CO2 23 22 - 32 mmol/L   Glucose, Bld 91 70 - 99 mg/dL   BUN 5 (L) 6 - 20 mg/dL   Creatinine, Ser 6.07 0.44 - 1.00 mg/dL   Calcium 9.1 8.9 - 37.1 mg/dL   Total Protein 7.2 6.5 - 8.1 g/dL   Albumin 3.6 3.5 - 5.0 g/dL   AST 62 (H) 15 - 41 U/L   ALT 52 (H) 0 - 44 U/L   Alkaline Phosphatase 58 38 - 126 U/L   Total Bilirubin 1.3 (H) 0.3 - 1.2 mg/dL   GFR, Estimated >06 >26 mL/min   Anion gap 7 5 - 15  Amylase     Status: None   Collection Time: 04/03/21 12:44 PM  Result Value Ref Range   Amylase 71 28 - 100 U/L  Lipase, blood     Status: Abnormal   Collection Time: 04/03/21 12:44 PM  Result Value Ref Range   Lipase 92 (H) 11 - 51 U/L  Wet prep, genital     Status: Abnormal   Collection Time: 04/03/21 12:50 PM   Specimen: Vaginal  Result Value Ref Range   Yeast Wet Prep HPF POC NONE SEEN NONE SEEN   Trich, Wet Prep NONE SEEN NONE SEEN   Clue Cells Wet Prep HPF POC PRESENT (A) NONE SEEN   WBC, Wet Prep HPF POC MODERATE (A) NONE SEEN   Sperm NONE SEEN    US OB LESS THAN 14 WEEKS WITH OB TRANSVAGINAL  Result Date: 04/03/2021 CLINICAL DATA:  Abdominal pain in first trimester of pregnancy, unknown LMP, quantitative beta HCG = 5995 EXAM: OBSTETRIC <14 WK [place] AND TRANSVAGINAL OB US TECHNIQUE: Both transabdominal and transvaginal ultrasound examinations were performed for complete evaluation of the gestation as well as the  maternal uterus, adnexal regions, and pelvic cul-de-sac. Transvaginal technique was performed to assess early pregnancy. COMPARISON:  None FINDINGS: Intrauterine gestational sac: Present, single Yolk sac:  Present Embryo:  Not identified Cardiac Activity: N/A Heart Rate: N/A  bpm MSD: 7 mm   5 w   2 d Subchorionic hemorrhage:  Small subchronic hemorrhage Maternal uterus/adnexae: Uterus anteverted, otherwise unremarkable. RIGHT ovary normal size and morphology 3.5 x 2.4 x 2.5 cm. LEFT ovary normal size and morphology 2.8 x 1.4 x 1.6 cm. No free pelvic fluid or adnexal masses. IMPRESSION: Small gestational sac within uterus with mean sac diameter of 5 weeks 2 days EGA. Small subchronic hemorrhage. No fetal pole identified to establish viability; may consider follow-up ultrasound in 14 days to establish viability if clinically indicated. Electronically Signed   By: Ulyses Southward M.D.   On: 04/03/2021 14:06   US Abdomen Limited RUQ (LIVER/GB)  Result Date: 04/03/2021 CLINICAL DATA:  Right upper quadrant pain EXAM: ULTRASOUND ABDOMEN LIMITED RIGHT UPPER QUADRANT COMPARISON:  Multiple prior ultrasounds including Mar 25, 2021, January 13, 2021 and December 06, 2020. FINDINGS: Gallbladder: Numerous cholelithiasis measuring up to 4 mm. No pericholecystic fluid or gallbladder wall thickening. Possible sonographic Murphy sign noted by sonographer. Common bile duct: Diameter: Similar enlargement of the common bile duct measuring 9 mm on ultrasound Liver: No focal lesion identified. Within normal limits in parenchymal echogenicity. Portal vein is patent  on color Doppler imaging with normal direction of blood flow towards the liver. Other: None. IMPRESSION: 1. Dilation of common bile duct measuring up to 9 mm, similar multiple prior ultrasounds. Recommend correlation with laboratory values to assess for biliary obstruction. 2. Cholelithiasis without wall thickening or pericholecystic fluid however a possible positive sonographic  Murphy sign noted by sonographer. Findings which are equivocal for acute cholecystitis. If continued clinical concern for acute cholecystitis suggest further evaluation with nuclear medicine HIDA scan to assess cystic duct patency. Electronically Signed   By: Maudry Mayhew MD   On: 04/03/2021 16:24   US Abdomen Limited RUQ (LIVER/GB)  Result Date: 03/25/2021 CLINICAL DATA:  Elevated liver enzymes EXAM: ULTRASOUND ABDOMEN LIMITED RIGHT UPPER QUADRANT COMPARISON:  01/13/2021 FINDINGS: Gallbladder: Numerous shadowing gallstones. No wall thickening or focal tenderness. No pericholecystic edema. Common bile duct: Diameter: 5 mm.  Where visualized, no filling defect. Liver: Negative for liver mass. Portal vein is patent on color Doppler imaging with normal direction of blood flow towards the liver. IMPRESSION: Multiple gallstones. No evidence of acute cholecystitis or ductal obstruction. Electronically Signed   By: Marnee Spring M.D.   On: 03/25/2021 05:07   MAU Course  Procedures  MDM -r/o ectopic, r/o cholecystitis/obstruction -CBC: WNL -CMP: AST/ALT 62/52, total bilirubin 1.3 -Amylase: WNL -Lipase: elevated at 92 -OB US: single GS, +yolk sac, no embryo, [redacted]w[redacted]d, sm SCH -Abdominal US: 1. Dilation of common bile duct measuring up to 9 mm, similar multiple prior ultrasounds. Recommend correlation with laboratory values to assess for biliary obstruction. 2. Cholelithiasis without wall thickening or pericholecystic fluid however a possible positive sonographic Murphy sign noted by sonographer. Findings which are equivocal for acute cholecystitis. If continued clinical concern for acute cholecystitis suggest further evaluation with nuclear medicine HIDA scan to assess cystic duct patency. -hCG: 5,995 -ABO: A Positive -WetPrep: +ClueCells (isolated finding not requiring treatment) -GC/CT collected -  Dilaudid given with relief of 8/10 pain to "uncomfortable feeling" which patient rates as 4/10; pt  resting comfortably in bed -given findings on [place] and abnormal labs, general surgery consulted -general surgery to bedside to speak with patient and reports to RN that patient will be admitted for surgery for gallbladder removal -admit for surgery per general surgery consult  Orders Placed This Encounter  Procedures  . Wet prep, genital    Standing Status:   Standing    Number of Occurrences:   1  . Resp Panel by RT-PCR (Flu A&B, Covid) Nasopharyngeal Swab    Standing Status:   Standing    Number of Occurrences:   1    Order Specific Question:   Is this test for diagnosis or screening    Answer:   Screening    Order Specific Question:   Symptomatic for COVID-19 as defined by CDC    Answer:   No    Order Specific Question:   Hospitalized for COVID-19    Answer:   No    Order Specific Question:   Admitted to ICU for COVID-19    Answer:   No    Order Specific Question:   Previously tested for COVID-19    Answer:   Yes    Order Specific Question:   Resident in a congregate (group) care setting    Answer:   Unknown    Order Specific Question:   Employed in healthcare setting    Answer:   Unknown    Order Specific Question:   Pregnant    Answer:   Yes  Order Specific Question:   Has patient completed COVID vaccination(s) (2 doses of Pfizer/Moderna 1 dose of [employer])    [place]  . US OB LESS THAN 14 WEEKS WITH OB TRANSVAGINAL    Standing Status:   Standing    Number of Occurrences:   1    Order Specific Question:   Symptom/Reason for Exam    Answer:   Abdominal pain in pregnancy [161096][[id number]]  . US Abdomen Limited RUQ (LIVER/GB)    Standing Status:   Standing    Number of Occurrences:   1    Order Specific Question:   Symptom/Reason for Exam    Answer:   RUQ pain [251471]  . US OB LESS THAN 14 WEEKS WITH OB TRANSVAGINAL    Standing Status:   Future    Standing Expiration Date:   04/03/2022    Scheduling Instructions:     Please schedule patient for follow-up US,  Monday - Thursday between 8:00 am - 10:00 am and 12:00 pm - 3:00 pm. The patient will follow-up with [facility] or receive a phone call from a provider after US for results. Do NOT schedule afternoon appointments on 02/17/21, 04/14/21, 06/16/21, 08/18/21 or 10/13/21.    Order Specific Question:   Reason for Exam (SYMPTOM  OR DIAGNOSIS REQUIRED)    Answer:   viability    Order Specific Question:   Preferred Imaging Location?    Answer:   [facility]  . hCG, quantitative, pregnancy    Standing Status:   Standing    Number of Occurrences:   1  . CBC    Standing Status:   Standing    Number of Occurrences:   1  . Comprehensive metabolic panel    Standing Status:   Standing    Number of Occurrences:   1  . Amylase    Standing Status:   Standing    Number of Occurrences:   1  . Lipase, blood    Standing Status:   Standing    Number of Occurrences:   1  . Diet NPO time specified    Standing Status:   Standing    Number of Occurrences:   1  . Airborne and Contact precautions    Standing Status:   Standing    Number of Occurrences:   1   Meds ordered this encounter  Medications  . HYDROmorphone (DILAUDID) injection 1 mg   Assessment and Plan   1. Pregnancy with uncertain fetal viability, single or unspecified fetus   2. Abdominal pain in pregnancy   3. RUQ pain   4. Gall stones   5. [redacted] weeks gestation of pregnancy   6. Subchorionic hemorrhage of placenta in first trimester, single or unspecified fetus   7. Blood type, Rh positive    -admit for gallbladder surgery  Kayla [name] 04/03/2021, 6:49 PM

## 2021-04-03 NOTE — ED Provider Notes (Signed)
Emergency Medicine Provider OB Triage Evaluation Note  Kayla [name] is a 25 y.o. female, G3P1001, at Unknown gestation who presents to the emergency department with complaints of upper R abdominal pain with associated nausea and vomiting. Hx of "gallbladder issues" since October.  Was scheduled for surgery, but this was canceled 2/2 current pregnancy.  Pt has been taking muscle relaxer for this pain without relief. Pt reports yesterday she was having lower abd pain as well.  No vaginal bleeding or discharge.   Positive pregnancy test on 03/25/2021 at this facility.    Review of  Systems  Positive: Abdominal pain, nausea, vomiting Negative: Weakness, dizziness, syncope  Physical Exam  BP 124/87 (BP Location: Left Arm)   Pulse 90   Temp 98 F (36.7 C) (Oral)   Resp 20   LMP 12/08/2020   SpO2 98%   General: Awake, uncomfortable, rocking in her wheelchair HEENT: Atraumatic  Resp: Normal effort  Cardiac: Normal rate Abd: Nondistended, RUQ abd tenderness MSK: Moves all extremities without difficulty Neuro: Speech clear  Medical Decision Making  Pt evaluated for pregnancy concern and is stable for transfer to MAU. Pt is in agreement with plan for transfer.  3:29 AM Discussed with MAU APP, Heather, who accepts patient in transfer.  Clinical Impression   1. RUQ abdominal pain       Bryttani Blew, Boyd Kerbs 04/03/21 0331    Zadie Rhine, MD 04/04/21 [phone]

## 2021-04-03 NOTE — MAU Note (Signed)
Pt reports to mau with c/o upper right abd pain that radiates around to her back.  Pt states she was seen in mau last night and reports imaging confirmed gallstones.  Pt states her pain has returned since leaving mau this morning.  Pt reports she took tylenol and and a "muscle relaxer" around 0600 with no relief.  Denies bleeding or lower abd pain.

## 2021-04-03 NOTE — Discharge Instructions (Signed)
Cholelithiasis  Cholelithiasis is a disease in which gallstones form in the gallbladder. The gallbladder is an organ that stores bile. Bile is a fluid that helps to digest fats. Gallstones begin as small crystals and can slowly grow into stones. They may cause no symptoms until they block the gallbladder duct, or cystic duct, when the gallbladder tightens (contracts) after food is eaten. This can cause pain and is known as a gallbladder attack, or biliary colic. There are two main types of gallstones:  Cholesterol stones. These are the most common type of gallstone. These stones are made of hardened cholesterol and are usually yellow-green in color. Cholesterol is a fat-like substance that is made in the liver.  Pigment stones. These are dark in color and are made of a red-yellow substance, called bilirubin,that forms when hemoglobin from red blood cells breaks down. What are the causes? This condition may be caused by an imbalance in the different parts that make bile. This can happen if the bile:  Has too much bilirubin. This can happen in certain blood diseases, such as sickle cell anemia.  Has too much cholesterol.  Does not have enough bile salts. These salts help the body absorb and digest fats. In some cases, this condition can also be caused by the gallbladder not emptying completely or often enough. This is common during pregnancy. What increases the risk? The following factors may make you more likely to develop this condition:  Being female.  Having multiple pregnancies. Health care providers sometimes advise removing diseased gallbladders before future pregnancies.  Eating a diet that is heavy in fried foods, fat, and refined carbohydrates, such as white bread and white rice.  Being obese.  Being older than age 40.  Using medicines that contain female hormones (estrogen) for a long time.  Losing weight quickly.  Having a family history of gallstones.  Having certain  medical problems, such as: ? Diabetes mellitus. ? Cystic fibrosis. ? Crohn's disease. ? Cirrhosis or other long-term (chronic) liver disease. ? Certain blood diseases, such as sickle cell anemia or leukemia. What are the signs or symptoms? In many cases, having gallstones causes no symptoms. When you have gallstones but do not have symptoms, you have silent gallstones. If a gallstone blocks your bile duct, it can cause a gallbladder attack. The main symptom of a gallbladder attack is sudden pain in the upper right part of the abdomen. The pain:  Usually comes at night or after eating.  Can last for one hour or more.  Can spread to your right shoulder, back, or chest.  Can feel like indigestion. This is discomfort, burning, or fullness in your upper abdomen. If the bile duct is blocked for more than a few hours, it can cause an infection or inflammation of your gallbladder (cholecystitis), liver, or pancreas. This can cause:  Nausea or vomiting.  Bloating.  Pain in your abdomen that lasts for 5 hours or longer.  Tenderness in your upper abdomen, often in the upper right section and under your rib cage.  Fever or chills.  Skin or the white parts of your eyes turning yellow (jaundice). This usually happens when a stone has blocked bile from passing through the common bile duct.  Dark urine or light-colored stools. How is this diagnosed? This condition may be diagnosed based on:  A physical exam.  Your medical history.  Ultrasound.  CT scan.  MRI. You may also have other tests, including:  Blood tests to check for signs of an   infection or inflammation.  Cholescintigraphy, or HIDA scan. This is a scan of your gallbladder and bile ducts (biliary system) using non-harmful radioactive material and special cameras that can see the radioactive material.  Endoscopic retrograde cholangiopancreatogram. This involves inserting a small tube with a camera on the end (endoscope)  through your mouth to look at bile ducts and check for blockages. How is this treated? Treatment for this condition depends on the severity of the condition. Silent gallstones do not need treatment. Treatment may be needed if a blockage causes a gallbladder attack or other symptoms. Treatment may include:  Home care, if symptoms are not severe. ? During a simple gallbladder attack, stop eating and drinking for 12-24 hours (except for water and clear liquids). This helps to "cool down" your gallbladder. After 1 or 2 days, you can start to eat a diet of simple or clear foods, such as broths and crackers. ? You may also need medicines for pain or nausea or both. ? If you have cholecystitis and an infection, you will need antibiotics.  A hospital stay, if needed for pain control or for cholecystitis with severe infection.  Cholecystectomy, or surgery to remove your gallbladder. This is the most common treatment if all other treatments have not worked.  Medicines to break up gallstones. These are most effective at treating small gallstones. Medicines may be used for up to 6-12 months.  Endoscopic retrograde cholangiopancreatogram. A small basket can be attached to the endoscope and used to capture and remove gallstones, mainly those that are in the common bile duct. Follow these instructions at home: Medicines  Take over-the-counter and prescription medicines only as told by your health care provider.  If you were prescribed an antibiotic medicine, take it as told by your health care provider. Do not stop taking the antibiotic even if you start to feel better.  Ask your health care provider if the medicine prescribed to you requires you to avoid driving or using machinery. Eating and drinking  Drink enough fluid to keep your urine pale yellow. This is important during a gallbladder attack. Water and clear liquids are preferred.  Follow a healthy diet. This includes: ? Reducing fatty foods,  such as fried food and foods high in cholesterol. ? Reducing refined carbohydrates, such as white bread and white rice. ? Eating more fiber. Aim for foods such as almonds, fruit, and beans. Alcohol use  If you drink alcohol: ? Limit how much you use to:  0-1 drink a day for nonpregnant women.  0-2 drinks a day for men. ? Be aware of how much alcohol is in your drink. In the [place] one drink equals one 12 oz bottle of beer (355 mL), one 5 oz glass of wine (148 mL), or one 1 oz glass of hard liquor (44 mL). General instructions  Do not use any products that contain nicotine or tobacco, such as cigarettes, e-cigarettes, and chewing tobacco. If you need help quitting, ask your health care provider.  Maintain a healthy weight.  Keep all follow-up visits as told by your health care provider. These may include consultations with a surgeon or specialist. This is important. Where to find more information  National Institute of Diabetes and Digestive and Kidney Diseases: www.niddk.nih.gov Contact a health care provider if:  You think you have had a gallbladder attack.  You have been diagnosed with silent gallstones and you develop pain in your abdomen or indigestion.  You begin to have attacks more often.  You have   dark urine or light-colored stools. Get help right away if:  You have pain from a gallbladder attack that lasts for more than 2 hours.  You have pain in your abdomen that lasts for more than 5 hours or is getting worse.  You have a fever or chills.  You have nausea and vomiting that do not go away.  You develop jaundice. Summary  Cholelithiasis is a disease in which gallstones form in the gallbladder.  This condition may be caused by an imbalance in the different parts that make bile. This can happen if your bile has too much bilirubin or cholesterol, or does not have enough bile salts.  Treatment for gallstones depends on the severity of the condition. Silent  gallstones do not need treatment.  If gallstones cause a gallbladder attack or other symptoms, treatment usually involves not eating or drinking anything. Treatment may also include pain medicines and antibiotics, and it sometimes includes a hospital stay.  Surgery to remove the gallbladder is common if all other treatments have not worked. This information is not intended to replace advice given to you by your health care provider. Make sure you discuss any questions you have with your health care provider. Document Revised: 09/08/2019 Document Reviewed: 09/08/2019 Elsevier Patient Education  2021 [employer].  

## 2021-04-03 NOTE — ED Triage Notes (Addendum)
Per EMS, pt from home c/o upper right quad abdominal pain.  She is scheduled for gallbladder surgery however it was cancelled due to a pregnancy.  Pt had a miscarriage in April.  120/64 88pulse 18 RR  100% 115 CBG

## 2021-04-03 NOTE — MAU Provider Note (Signed)
History     CSN: [id number]  Arrival date and time: 04/03/21 0307   Event Date/Time   First Provider Initiated Contact with Patient 04/03/21 [phone]      Chief Complaint  Patient presents with  . Abdominal Pain   Kayla [name] is a 25 y.o. G3P1001 at Unknown who presents today with RUQ pain. She has known gallstones and was scheduled to have her gallbladder removed, but surgery was cancelled due to pregnancy. She states that last evening she started having spasms and she took some tylenol and flexeril. Then she started having intense pain and vomiting. She reports that she vomited 3 times. So EMS was called and she was brought here. Now she is feeling better and she states, "I feel the stone has passed. I almost just went home". She states that her pain is better and feels mostly like soreness now.   Abdominal Pain This is a new problem. The current episode started today. The onset quality is sudden. The problem occurs constantly. The problem has been gradually improving. The pain is located in the RUQ. The pain is at a severity of 5/10. The quality of the pain is aching. The abdominal pain does not radiate. Associated symptoms include nausea and vomiting. Pertinent negatives include no dysuria, fever or frequency. Nothing aggravates the pain. The pain is relieved by nothing. She has tried acetaminophen (flexeril ) for the symptoms. The treatment provided moderate relief. Prior diagnostic workup includes ultrasound. Her past medical history is significant for gallstones.    OB History    Gravida  3   Para  1   Term  1   Preterm  0   AB  0   Living  1     SAB  0   IAB  0   Ectopic  0   Multiple  0   Live Births  1           Past Medical History:  Diagnosis Date  . Anemia    pregnancy  . Gallstones 10/2020  . Headache     Past Surgical History:  Procedure Laterality Date  . NO PAST SURGERIES      Family History  Problem Relation Age of Onset  .  Hypertension Mother   . Other Mother        lymphodema  . Hypertension Maternal Grandmother   . Stroke Maternal Grandmother   . Hypertension Maternal Grandfather   . Other Maternal Grandfather        blood clotting disorder    Social History   Tobacco Use  . Smoking status: Never Smoker  . Smokeless tobacco: Never Used  Vaping Use  . Vaping Use: Never used  Substance Use Topics  . Alcohol use: Not Currently  . Drug use: Never    Allergies:  Allergies  Allergen Reactions  . Shrimp [Shellfish Allergy] Nausea And Vomiting and Rash    PT states she also gets a fever     Medications Prior to Admission  Medication Sig Dispense Refill Last Dose  . acetaminophen (TYLENOL) 500 MG tablet Take 1,500 mg by mouth every 6 (six) hours as needed for moderate pain or headache.   04/03/2021 at Unknown time  . cyclobenzaprine (FLEXERIL) 10 MG tablet Take 1 tablet (10 mg total) by mouth 2 (two) times daily as needed for muscle spasms. 30 tablet 0 04/03/2021 at Unknown time  . OVER THE COUNTER MEDICATION Take 3 capsules by mouth daily. HUMS stool softener/laxative  Review of Systems  Constitutional: Negative for chills and fever.  Gastrointestinal: Positive for abdominal pain, nausea and vomiting.  Genitourinary: Negative for dysuria, frequency, pelvic pain, vaginal bleeding and vaginal discharge.   Physical Exam   Blood pressure 119/75, pulse 82, temperature 98.5 F (36.9 C), temperature source Oral, resp. rate 16, last menstrual period 12/08/2020, SpO2 100 %, unknown if currently breastfeeding.  Physical Exam Vitals and nursing note reviewed.  Constitutional:      General: She is not in acute distress. HENT:     Head: Normocephalic.  Eyes:     Pupils: Pupils are equal, round, and reactive to light.  Cardiovascular:     Rate and Rhythm: Normal rate.  Pulmonary:     Effort: Pulmonary effort is normal.  Abdominal:     Palpations: Abdomen is soft.     Tenderness: There is no  abdominal tenderness.  Skin:    General: Skin is warm.  Neurological:     Mental Status: She is alert and oriented to person, place, and time.  Psychiatric:        Mood and Affect: Mood normal.        Behavior: Behavior normal.     MAU Course  Procedures  MDM Offered patient IV fluids and pain meds here now, but she states that she is feeling better. She feels ready for DC home and has meds at home that can generally treat her pain, and she was just given a prescription for nausea meds as well.    Assessment and Plan   1. RUQ abdominal pain    DC home Comfort measures reviewed  1st Trimester precautions  Bleeding precautions Ectopic precautions RX: none  Return to MAU as needed FU with OB as planned   Follow-up Information    Department, [facility] Follow up.   Contact information: [address] [place] [address] [phone]              Thressa Sheller DNP, CNM  04/03/21  4:49 AM

## 2021-04-03 NOTE — Progress Notes (Signed)
Reason for Consult/Chief Complaint: biliary colic Consultant: Nugent, NP  Kayla [name] is an 25 y.o. female.   HPI: 57F with a history of RUQ abdominal pain, n/v who was scheduled to have a lap chole 12/2020 by Dr. [name]. She had a +UPT on pre-op labs and surgery was cancelled. The pregnancy ended in miscarriage and she rescheduled surgery for 04/04/2021 with pre-op labs scheduled for 03/26/2021. She presented to the ED 03/25/2021 with biliary symptoms and again was noted to have a +UPT with subsequent uptrending betas consistent with a viable pregnancy. She presented today, again with biliary symptoms and has undergone a TVUS with gestational sac c/w IUP at 5+2 EGA, but no fetal pole visible to be able to confirm viability. Symptoms are nausea, vomiting, RUQ abdominal pain with radiation to the back. Last BM 6/5, normal in quality.   Past Medical History:  Diagnosis Date  . Anemia    pregnancy  . Gallstones 10/2020  . Headache     Past Surgical History:  Procedure Laterality Date  . NO PAST SURGERIES      Family History  Problem Relation Age of Onset  . Hypertension Mother   . Other Mother        lymphodema  . Hypertension Maternal Grandmother   . Stroke Maternal Grandmother   . Hypertension Maternal Grandfather   . Other Maternal Grandfather        blood clotting disorder    Social History:  reports that she has never smoked. She has never used smokeless tobacco. She reports previous alcohol use. She reports that she does not use drugs.  Allergies:  Allergies  Allergen Reactions  . Shrimp [Shellfish Allergy] Nausea And Vomiting and Rash    PT states she also gets a fever     Medications: I have reviewed the patient's current medications.  Results for orders placed or performed during the hospital encounter of 04/03/21 (from the past 48 hour(s))  hCG, quantitative, pregnancy     Status: Abnormal   Collection Time: 04/03/21 11:20 AM  Result Value Ref Range    hCG, Beta Chain, Quant, S 5,995 (H) <5 mIU/mL    Comment:          GEST. AGE      CONC.  (mIU/mL)   <=1 WEEK        5 - 50     2 WEEKS       50 - 500     3 WEEKS       100 - 10,000     4 WEEKS     1,000 - 30,000     5 WEEKS     3,500 - 115,000   6-8 WEEKS     12,000 - 270,000    12 WEEKS     15,000 - 220,000        FEMALE AND NON-PREGNANT FEMALE:     LESS THAN 5 mIU/mL Performed at [facility], [address], [place], [place] [address]   CBC     Status: None   Collection Time: 04/03/21 12:44 PM  Result Value Ref Range   WBC 8.9 4.0 - 10.5 K/uL   RBC 4.41 3.87 - 5.11 MIL/uL   Hemoglobin 12.3 12.0 - 15.0 g/dL   HCT 93.7 34.2 - 87.6 %   MCV 85.7 80.0 - 100.0 fL   MCH 27.9 26.0 - 34.0 pg   MCHC 32.5 30.0 - 36.0 g/dL   RDW 81.1 57.2 -  15.5 %   Platelets 356 150 - 400 K/uL   nRBC 0.0 0.0 - 0.2 %    Comment: Performed at [facility], [address], [place], [place] [address]  Comprehensive metabolic panel     Status: Abnormal   Collection Time: 04/03/21 12:44 PM  Result Value Ref Range   Sodium 135 135 - 145 mmol/L   Potassium 3.8 3.5 - 5.1 mmol/L   Chloride 105 98 - 111 mmol/L   CO2 23 22 - 32 mmol/L   Glucose, Bld 91 70 - 99 mg/dL    Comment: Glucose reference range applies only to samples taken after fasting for at least 8 hours.   BUN 5 (L) 6 - 20 mg/dL   Creatinine, Ser 6.71 0.44 - 1.00 mg/dL   Calcium 9.1 8.9 - 24.5 mg/dL   Total Protein 7.2 6.5 - 8.1 g/dL   Albumin 3.6 3.5 - 5.0 g/dL   AST 62 (H) 15 - 41 U/L   ALT 52 (H) 0 - 44 U/L   Alkaline Phosphatase 58 38 - 126 U/L   Total Bilirubin 1.3 (H) 0.3 - 1.2 mg/dL   GFR, Estimated >80 >99 mL/min    Comment: (NOTE) Calculated using the CKD-EPI Creatinine Equation (2021)    Anion gap 7 5 - 15    Comment: Performed at [facility], [address], [place], [place] [address]  Amylase     Status: None   Collection Time: 04/03/21 12:44 PM  Result Value Ref Range   Amylase 71 28 - 100 U/L    Comment:  Performed at [facility], [address], [place], [place] [address]  Lipase, blood     Status: Abnormal   Collection Time: 04/03/21 12:44 PM  Result Value Ref Range   Lipase 92 (H) 11 - 51 U/L    Comment: Performed at [facility], [address], [place], [place] [address]  Wet prep, genital     Status: Abnormal   Collection Time: 04/03/21 12:50 PM   Specimen: Vaginal  Result Value Ref Range   Yeast Wet Prep HPF POC NONE SEEN NONE SEEN   Trich, Wet Prep NONE SEEN NONE SEEN   Clue Cells Wet Prep HPF POC PRESENT (A) NONE SEEN   WBC, Wet Prep HPF POC MODERATE (A) NONE SEEN   Sperm NONE SEEN     Comment: Performed at [facility], [address], [place], [place] [address]    US OB LESS THAN 14 WEEKS WITH OB TRANSVAGINAL  Result Date: 04/03/2021 CLINICAL DATA:  Abdominal pain in first trimester of pregnancy, unknown LMP, quantitative beta HCG = 5995 EXAM: OBSTETRIC <14 WK [place] AND TRANSVAGINAL OB US TECHNIQUE: Both transabdominal and transvaginal ultrasound examinations were performed for complete evaluation of the gestation as well as the maternal uterus, adnexal regions, and pelvic cul-de-sac. Transvaginal technique was performed to assess early pregnancy. COMPARISON:  None FINDINGS: Intrauterine gestational sac: Present, single Yolk sac:  Present Embryo:  Not identified Cardiac Activity: N/A Heart Rate: N/A  bpm MSD: 7 mm   5 w   2 d Subchorionic hemorrhage:  Small subchronic hemorrhage Maternal uterus/adnexae: Uterus anteverted, otherwise unremarkable. RIGHT ovary normal size and morphology 3.5 x 2.4 x 2.5 cm. LEFT ovary normal size and morphology 2.8 x 1.4 x 1.6 cm. No free pelvic fluid or adnexal masses. IMPRESSION: Small gestational sac within uterus with mean sac diameter of 5 weeks 2 days EGA. Small subchronic  hemorrhage. No fetal pole identified to establish viability; may consider follow-up ultrasound in 14 days to establish viability if clinically indicated.  Electronically Signed   By: Ulyses Southward M.D.   On: 04/03/2021 14:06   US Abdomen Limited RUQ (LIVER/GB)  Result Date: 04/03/2021 CLINICAL DATA:  Right upper quadrant pain EXAM: ULTRASOUND ABDOMEN LIMITED RIGHT UPPER QUADRANT COMPARISON:  Multiple prior ultrasounds including Mar 25, 2021, January 13, 2021 and December 06, 2020. FINDINGS: Gallbladder: Numerous cholelithiasis measuring up to 4 mm. No pericholecystic fluid or gallbladder wall thickening. Possible sonographic Murphy sign noted by sonographer. Common bile duct: Diameter: Similar enlargement of the common bile duct measuring 9 mm on ultrasound Liver: No focal lesion identified. Within normal limits in parenchymal echogenicity. Portal vein is patent on color Doppler imaging with normal direction of blood flow towards the liver. Other: None. IMPRESSION: 1. Dilation of common bile duct measuring up to 9 mm, similar multiple prior ultrasounds. Recommend correlation with laboratory values to assess for biliary obstruction. 2. Cholelithiasis without wall thickening or pericholecystic fluid however a possible positive sonographic Murphy sign noted by sonographer. Findings which are equivocal for acute cholecystitis. If continued clinical concern for acute cholecystitis suggest further evaluation with nuclear medicine HIDA scan to assess cystic duct patency. Electronically Signed   By: Maudry Mayhew MD   On: 04/03/2021 16:24    ROS 10 point review of systems is negative except as listed above in HPI.   Physical Exam Blood pressure 134/87, pulse 97, temperature 98.3 F (36.8 C), temperature source Oral, resp. rate 18, last menstrual period 12/08/2020, SpO2 95 %, unknown if currently breastfeeding. Constitutional: well-developed, well-nourished HEENT: pupils equal, round, reactive to light, 76mm b/l, moist conjunctiva, external inspection of ears and nose normal, hearing intact Oropharynx: normal oropharyngeal mucosa, normal dentition Neck: no  thyromegaly, trachea midline, no midline cervical tenderness to palpation Chest: breath sounds equal bilaterally, normal respiratory effort, no midline or lateral chest wall tenderness to palpation/deformity Abdomen: soft, NT, no bruising, no hepatosplenomegaly GU: normal female genitalia  Back: no wounds, no thoracic/lumbar spine tenderness to palpation, no thoracic/lumbar spine stepoffs Rectal: deferred Extremities: 2+ radial and pedal pulses bilaterally, motor and sensation intact to bilateral UE and LE, no peripheral edema MSK: normal gait/station, no clubbing/cyanosis of fingers/toes, normal ROM of all four extremities Skin: warm, dry, no rashes Psych: normal memory, normal mood/affect    Assessment/Plan: 38F with biliary colic and mild transaminitis and hyperbilirubinemia. Extensive discussion held with the patient regarding the chronicity and severity of her symptoms, especially in the setting of a very early pregnancy. Expect that biliary symptoms will continue throughout the duration of pregnancy. We discussed the option of MRCP vs trending labs to r/o choledocholithiasis if she desires not to proceed with surgery, however patient is explicit that she wants to undergo surgery. We discussed that surgery would create a risk to the fetus with both anesthetic risk as well as fluoroscopic risk (IOC) and that these risks are elevated in the first trimester of pregnancy for significant birth defects that can even result in spontaneous pregnancy termination. We discussed the possibility of CBD stone identified during IOC that cannot be cleared intra-operatively resulting in the need for a second procedure, ERCP, that would carry additional risk to the fetus as a result of a second anesthetic event. Patient verbalizes understanding of these risks and desires to proceed with surgery. Will plan to shield lower abdomen during procedure. Informed consent was obtained after detailed explanation of risks,  including bleeding,  infection, biloma, need for IOC to delineate anatomy, and need for conversion to open procedure. All questions answered to the patient's satisfaction. COVID swab ordered. NPO.    Diamantina MonksAyesha N. Sharis Keeran, MD General and Trauma Surgery [facility]

## 2021-04-03 NOTE — MAU Note (Signed)
Pt transferred over from [facility] ED, h/o known gallstones. Pain started again tonight at 53mn, vomiting.

## 2021-04-03 NOTE — Anesthesia Preprocedure Evaluation (Addendum)
Anesthesia Evaluation  Patient identified by MRN, date of birth, ID band Patient awake    Reviewed: Allergy & Precautions, NPO status , Patient's Chart, lab work & pertinent test results  Airway Mallampati: III  TM Distance: >3 FB Neck ROM: Full    Dental no notable dental hx.    Pulmonary neg pulmonary ROS,    Pulmonary exam normal breath sounds clear to auscultation       Cardiovascular negative cardio ROS Normal cardiovascular exam Rhythm:Regular Rate:Normal     Neuro/Psych  Headaches, negative psych ROS   GI/Hepatic negative GI ROS, Neg liver ROS, Biliary colic   Endo/Other  negative endocrine ROS  Renal/GU negative Renal ROS     Musculoskeletal negative musculoskeletal ROS (+)   Abdominal (+) + obese,   Peds  Hematology negative hematology ROS (+) anemia ,   Anesthesia Other Findings BILIARY COLIC TVUS with gestational sac c/w IUP at 5+2 EGA.   Reproductive/Obstetrics (+) Pregnancy                           Anesthesia Physical Anesthesia Plan  ASA: II  Anesthesia Plan: General   Post-op Pain Management:    Induction: Intravenous  PONV Risk Score and Plan: 3 and Ondansetron, Dexamethasone, Treatment may vary due to age or medical condition and Propofol infusion  Airway Management Planned: Mask and Oral ETT  Additional Equipment: None  Intra-op Plan:   Post-operative Plan: Extubation in OR  Informed Consent: I have reviewed the patients History and Physical, chart, labs and discussed the procedure including the risks, benefits and alternatives for the proposed anesthesia with the patient or authorized representative who has indicated his/her understanding and acceptance.     Dental advisory given  Plan Discussed with: CRNA  Anesthesia Plan Comments: (Lab Results      Component                Value               Date                      WBC                      8.9                  04/03/2021                HGB                      12.3                04/03/2021                HCT                      37.8                04/03/2021                MCV                      85.7                04/03/2021                PLT  356                 04/03/2021           Lab Results      Component                Value               Date                      NA                       135                 04/03/2021                K                        3.8                 04/03/2021                CO2                      23                  04/03/2021                GLUCOSE                  91                  04/03/2021                BUN                      5 (L)               04/03/2021                CREATININE               0.66                04/03/2021                CALCIUM                  9.1                 04/03/2021                GFRNONAA                 >60                 04/03/2021                GFRAA                    >60                 03/16/2020          )       Anesthesia Quick Evaluation

## 2021-04-04 ENCOUNTER — Encounter (HOSPITAL_COMMUNITY): Payer: Self-pay | Admitting: General Surgery

## 2021-04-04 ENCOUNTER — Encounter (HOSPITAL_COMMUNITY): Admission: AD | Disposition: A | Discharge status: Home / Self Care | Payer: Self-pay | Source: Home / Self Care

## 2021-04-04 ENCOUNTER — Encounter (HOSPITAL_COMMUNITY): Admission: RE | Payer: Self-pay | Source: Home / Self Care

## 2021-04-04 ENCOUNTER — Ambulatory Visit (HOSPITAL_COMMUNITY): Admission: RE | Admit: 2021-04-04 | Payer: Medicaid Other | Source: Home / Self Care | Admitting: Surgery

## 2021-04-04 ENCOUNTER — Observation Stay (HOSPITAL_COMMUNITY): Payer: Medicaid Other | Admitting: Anesthesiology

## 2021-04-04 DIAGNOSIS — Z20822 Contact with and (suspected) exposure to covid-19: Secondary | ICD-10-CM | POA: Diagnosis not present

## 2021-04-04 DIAGNOSIS — O26891 Other specified pregnancy related conditions, first trimester: Secondary | ICD-10-CM | POA: Diagnosis not present

## 2021-04-04 DIAGNOSIS — O208 Other hemorrhage in early pregnancy: Secondary | ICD-10-CM | POA: Diagnosis not present

## 2021-04-04 DIAGNOSIS — K8010 Calculus of gallbladder with chronic cholecystitis without obstruction: Secondary | ICD-10-CM | POA: Diagnosis not present

## 2021-04-04 HISTORY — PX: CHOLECYSTECTOMY: SHX55

## 2021-04-04 LAB — CBC
HCT: 37.6 % (ref 36.0–46.0)
Hemoglobin: 12 g/dL (ref 12.0–15.0)
MCH: 27.8 pg (ref 26.0–34.0)
MCHC: 31.9 g/dL (ref 30.0–36.0)
MCV: 87 fL (ref 80.0–100.0)
Platelets: 303 10*3/uL (ref 150–400)
RBC: 4.32 MIL/uL (ref 3.87–5.11)
RDW: 14 % (ref 11.5–15.5)
WBC: 7.6 10*3/uL (ref 4.0–10.5)
nRBC: 0 % (ref 0.0–0.2)

## 2021-04-04 LAB — COMPREHENSIVE METABOLIC PANEL
ALT: 41 U/L (ref 0–44)
AST: 27 U/L (ref 15–41)
Albumin: 3.2 g/dL — ABNORMAL LOW (ref 3.5–5.0)
Alkaline Phosphatase: 51 U/L (ref 38–126)
Anion gap: 8 (ref 5–15)
BUN: 7 mg/dL (ref 6–20)
CO2: 24 mmol/L (ref 22–32)
Calcium: 8.8 mg/dL — ABNORMAL LOW (ref 8.9–10.3)
Chloride: 104 mmol/L (ref 98–111)
Creatinine, Ser: 0.78 mg/dL (ref 0.44–1.00)
GFR, Estimated: >60 mL/min (ref >60)
Glucose, Bld: 78 mg/dL (ref 70–99)
Potassium: 3.5 mmol/L (ref 3.5–5.1)
Sodium: 136 mmol/L (ref 135–145)
Total Bilirubin: 0.9 mg/dL (ref 0.3–1.2)
Total Protein: 6.6 g/dL (ref 6.5–8.1)

## 2021-04-04 LAB — GC/CHLAMYDIA PROBE AMP (~~LOC~~) NOT AT ARMC
Chlamydia: POSITIVE — AB
Comment: NEGATIVE
Comment: NORMAL
Neisseria Gonorrhea: NEGATIVE

## 2021-04-04 LAB — HIV ANTIBODY (ROUTINE TESTING W REFLEX): HIV Screen 4th Generation wRfx: NONREACTIVE

## 2021-04-04 LAB — SURGICAL PCR SCREEN
MRSA, PCR: NEGATIVE
Staphylococcus aureus: NEGATIVE

## 2021-04-04 SURGERY — LAPAROSCOPIC CHOLECYSTECTOMY
Anesthesia: General | Site: Abdomen

## 2021-04-04 SURGERY — LAPAROSCOPIC CHOLECYSTECTOMY
Anesthesia: General

## 2021-04-04 MED ORDER — KETOROLAC TROMETHAMINE 30 MG/ML IJ SOLN
30.0000 mg | Freq: Four times a day (QID) | INTRAMUSCULAR | Status: DC | PRN
  Administered 2021-04-04 (×2): 30 mg via INTRAVENOUS
  Filled 2021-04-04 (×2): qty 1

## 2021-04-04 MED ORDER — DOCUSATE SODIUM 100 MG PO CAPS
100.0000 mg | ORAL_CAPSULE | Freq: Two times a day (BID) | ORAL | Status: DC
  Administered 2021-04-04 – 2021-04-05 (×3): 100 mg via ORAL
  Filled 2021-04-04 (×3): qty 1

## 2021-04-04 MED ORDER — ESMOLOL HCL 100 MG/10ML IV SOLN
INTRAVENOUS | Status: DC | PRN
  Administered 2021-04-04 (×2): 20 mg via INTRAVENOUS

## 2021-04-04 MED ORDER — SUGAMMADEX SODIUM 200 MG/2ML IV SOLN
INTRAVENOUS | Status: DC | PRN
  Administered 2021-04-04: 200 mg via INTRAVENOUS

## 2021-04-04 MED ORDER — LIDOCAINE 2% (20 MG/ML) 5 ML SYRINGE
INTRAMUSCULAR | Status: DC | PRN
  Administered 2021-04-04: 60 mg via INTRAVENOUS

## 2021-04-04 MED ORDER — HYDROMORPHONE HCL 1 MG/ML IJ SOLN
INTRAMUSCULAR | Status: DC | PRN
  Administered 2021-04-04 (×2): .5 mg via INTRAVENOUS

## 2021-04-04 MED ORDER — CHLORHEXIDINE GLUCONATE 0.12 % MT SOLN: 15.0000 mL | Freq: Once | OROMUCOSAL | Status: AC

## 2021-04-04 MED ORDER — FENTANYL CITRATE (PF) 250 MCG/5ML IJ SOLN
INTRAMUSCULAR | Status: DC | PRN
  Administered 2021-04-04: 50 ug via INTRAVENOUS
  Administered 2021-04-04: 100 ug via INTRAVENOUS
  Administered 2021-04-04 (×2): 50 ug via INTRAVENOUS

## 2021-04-04 MED ORDER — OXYCODONE HCL 5 MG PO TABS
ORAL_TABLET | ORAL | Status: AC
  Filled 2021-04-04: qty 1

## 2021-04-04 MED ORDER — MORPHINE SULFATE (PF) 2 MG/ML IV SOLN: 2.0000 mg | INTRAVENOUS | Status: DC | PRN

## 2021-04-04 MED ORDER — HYDROMORPHONE HCL 1 MG/ML IJ SOLN
0.5000 mg | INTRAMUSCULAR | Status: DC | PRN
  Administered 2021-04-04 (×2): 0.5 mg via INTRAVENOUS

## 2021-04-04 MED ORDER — HYDROMORPHONE HCL 1 MG/ML IJ SOLN
INTRAMUSCULAR | Status: AC
  Administered 2021-04-04: 0.5 mg via INTRAVENOUS
  Filled 2021-04-04: qty 2

## 2021-04-04 MED ORDER — PROMETHAZINE HCL 25 MG/ML IJ SOLN: 6.2500 mg | INTRAMUSCULAR | Status: DC | PRN

## 2021-04-04 MED ORDER — SODIUM CHLORIDE 0.9 % IV SOLN
2.0000 g | Freq: Every day | INTRAVENOUS | Status: DC
  Administered 2021-04-04: 2 g via INTRAVENOUS
  Filled 2021-04-04: qty 20
  Filled 2021-04-04: qty 2

## 2021-04-04 MED ORDER — OXYCODONE HCL 5 MG PO TABS: 5.0000 mg | ORAL_TABLET | Freq: Four times a day (QID) | ORAL | Status: DC | PRN

## 2021-04-04 MED ORDER — CYCLOBENZAPRINE HCL 5 MG PO TABS: 5.0000 mg | ORAL_TABLET | Freq: Three times a day (TID) | ORAL | Status: DC | PRN

## 2021-04-04 MED ORDER — ONDANSETRON HCL 4 MG/2ML IJ SOLN
4.0000 mg | INTRAMUSCULAR | Status: DC | PRN
  Administered 2021-04-04 – 2021-04-05 (×2): 4 mg via INTRAVENOUS
  Filled 2021-04-04 (×2): qty 2

## 2021-04-04 MED ORDER — HYDROMORPHONE HCL 1 MG/ML IJ SOLN
0.5000 mg | INTRAMUSCULAR | Status: DC | PRN
  Administered 2021-04-04: 0.5 mg via INTRAVENOUS

## 2021-04-04 MED ORDER — PROPOFOL 500 MG/50ML IV EMUL
INTRAVENOUS | Status: DC | PRN
  Administered 2021-04-04: 20 ug/kg/min via INTRAVENOUS

## 2021-04-04 MED ORDER — CHLORHEXIDINE GLUCONATE 0.12 % MT SOLN
OROMUCOSAL | Status: AC
  Administered 2021-04-04: 15 mL via OROMUCOSAL
  Filled 2021-04-04: qty 15

## 2021-04-04 MED ORDER — ACETAMINOPHEN 500 MG PO TABS: 1000.0000 mg | ORAL_TABLET | ORAL | Status: AC

## 2021-04-04 MED ORDER — ACETAMINOPHEN 500 MG PO TABS
ORAL_TABLET | ORAL | Status: AC
  Administered 2021-04-04: 1000 mg via ORAL
  Filled 2021-04-04: qty 2

## 2021-04-04 MED ORDER — MORPHINE SULFATE (PF) 2 MG/ML IV SOLN
2.0000 mg | INTRAVENOUS | Status: DC | PRN
  Administered 2021-04-04: 2 mg via INTRAVENOUS
  Filled 2021-04-04: qty 1

## 2021-04-04 MED ORDER — GABAPENTIN 300 MG PO CAPS
ORAL_CAPSULE | ORAL | Status: AC
  Filled 2021-04-04: qty 1

## 2021-04-04 MED ORDER — ONDANSETRON HCL 4 MG/2ML IJ SOLN: 4.0000 mg | INTRAMUSCULAR | Status: DC

## 2021-04-04 MED ORDER — 0.9 % SODIUM CHLORIDE (POUR BTL) OPTIME
TOPICAL | Status: DC | PRN
  Administered 2021-04-04: 1000 mL

## 2021-04-04 MED ORDER — OXYCODONE HCL 5 MG/5ML PO SOLN
5.0000 mg | Freq: Once | ORAL | Status: AC | PRN
Start: 2021-04-04 — End: 2021-04-04

## 2021-04-04 MED ORDER — FENTANYL CITRATE (PF) 100 MCG/2ML IJ SOLN
25.0000 ug | INTRAMUSCULAR | Status: DC | PRN
  Administered 2021-04-04: 50 ug via INTRAVENOUS

## 2021-04-04 MED ORDER — OXYCODONE HCL 5 MG PO TABS
5.0000 mg | ORAL_TABLET | Freq: Once | ORAL | Status: AC | PRN
  Administered 2021-04-04: 5 mg via ORAL

## 2021-04-04 MED ORDER — PROPOFOL 10 MG/ML IV BOLUS
INTRAVENOUS | Status: DC | PRN
  Administered 2021-04-04: 200 mg via INTRAVENOUS

## 2021-04-04 MED ORDER — ACETAMINOPHEN 325 MG PO TABS: 650.0000 mg | ORAL_TABLET | Freq: Four times a day (QID) | ORAL | Status: DC

## 2021-04-04 MED ORDER — ACETAMINOPHEN 500 MG PO TABS
1000.0000 mg | ORAL_TABLET | Freq: Four times a day (QID) | ORAL | Status: DC | PRN
  Administered 2021-04-04 – 2021-04-05 (×3): 1000 mg via ORAL
  Filled 2021-04-04 (×3): qty 2

## 2021-04-04 MED ORDER — SODIUM CHLORIDE 0.9 % IV SOLN
12.5000 mg | Freq: Four times a day (QID) | INTRAVENOUS | Status: DC | PRN
  Filled 2021-04-04: qty 0.5

## 2021-04-04 MED ORDER — DEXAMETHASONE SODIUM PHOSPHATE 10 MG/ML IJ SOLN
INTRAMUSCULAR | Status: DC | PRN
  Administered 2021-04-04: 4 mg via INTRAVENOUS

## 2021-04-04 MED ORDER — ORAL CARE MOUTH RINSE: 15.0000 mL | Freq: Once | OROMUCOSAL | Status: AC

## 2021-04-04 MED ORDER — POLYETHYLENE GLYCOL 3350 17 G PO PACK: 17.0000 g | PACK | Freq: Every day | ORAL | Status: DC | PRN

## 2021-04-04 MED ORDER — BUPIVACAINE HCL 0.25 % IJ SOLN
INTRAMUSCULAR | Status: DC | PRN
  Administered 2021-04-04: 7 mL

## 2021-04-04 MED ORDER — GABAPENTIN 300 MG PO CAPS: 300.0000 mg | ORAL_CAPSULE | ORAL | Status: DC

## 2021-04-04 MED ORDER — OXYCODONE HCL 5 MG PO TABS
5.0000 mg | ORAL_TABLET | ORAL | Status: DC | PRN
  Administered 2021-04-04: 10 mg via ORAL
  Administered 2021-04-05: 5 mg via ORAL
  Filled 2021-04-04: qty 1
  Filled 2021-04-04: qty 2
  Filled 2021-04-04: qty 1

## 2021-04-04 MED ORDER — ROCURONIUM BROMIDE 10 MG/ML (PF) SYRINGE
PREFILLED_SYRINGE | INTRAVENOUS | Status: DC | PRN
  Administered 2021-04-04: 10 mg via INTRAVENOUS
  Administered 2021-04-04: 40 mg via INTRAVENOUS

## 2021-04-04 MED ORDER — CEFAZOLIN SODIUM-DEXTROSE 2-4 GM/100ML-% IV SOLN
INTRAVENOUS | Status: AC
  Filled 2021-04-04: qty 100

## 2021-04-04 MED ORDER — SODIUM CHLORIDE 0.9 % IR SOLN
Status: DC | PRN
  Administered 2021-04-04: 1000 mL

## 2021-04-04 MED ORDER — LACTATED RINGERS IV SOLN: INTRAVENOUS | Status: DC

## 2021-04-04 MED ORDER — CEFAZOLIN SODIUM-DEXTROSE 2-4 GM/100ML-% IV SOLN
2.0000 g | INTRAVENOUS | Status: AC
  Administered 2021-04-04: 2 g via INTRAVENOUS

## 2021-04-04 MED ORDER — ONDANSETRON HCL 4 MG/2ML IJ SOLN
INTRAMUSCULAR | Status: DC | PRN
  Administered 2021-04-04: 4 mg via INTRAVENOUS

## 2021-04-04 MED ORDER — KETOROLAC TROMETHAMINE 30 MG/ML IJ SOLN: 30.0000 mg | Freq: Once | INTRAMUSCULAR | Status: DC | PRN

## 2021-04-04 MED ORDER — FENTANYL CITRATE (PF) 100 MCG/2ML IJ SOLN
INTRAMUSCULAR | Status: AC
  Administered 2021-04-04: 50 ug via INTRAVENOUS
  Filled 2021-04-04: qty 2

## 2021-04-04 SURGICAL SUPPLY — 50 items
ADH SKN CLS APL DERMABOND .7 (GAUZE/BANDAGES/DRESSINGS) ×2
APL PRP STRL LF DISP 70% ISPRP (MISCELLANEOUS) ×2
APPLIER CLIP 5 13 M/L LIGAMAX5 (MISCELLANEOUS)
APR CLP MED LRG 5 ANG JAW (MISCELLANEOUS)
BAG SPEC RTRVL 10 TROC 200 (ENDOMECHANICALS) ×2
CANISTER SUCT 3000ML PPV (MISCELLANEOUS) ×3 IMPLANT
CHLORAPREP W/TINT 26 (MISCELLANEOUS) ×3 IMPLANT
CLIP APPLIE 5 13 M/L LIGAMAX5 (MISCELLANEOUS) IMPLANT
CLIP VESOLOCK MED LG 6/CT (CLIP) ×5 IMPLANT
COVER MAYO STAND STRL (DRAPES) ×3 IMPLANT
COVER SURGICAL LIGHT HANDLE (MISCELLANEOUS) ×3 IMPLANT
COVER TRANSDUCER ULTRASND (DRAPES) ×3 IMPLANT
COVER WAND RF STERILE (DRAPES) ×3 IMPLANT
DERMABOND ADVANCED (GAUZE/BANDAGES/DRESSINGS) ×1
DERMABOND ADVANCED .7 DNX12 (GAUZE/BANDAGES/DRESSINGS) ×2 IMPLANT
DRAPE C-ARM 42X120 X-RAY (DRAPES) ×3 IMPLANT
ELECT REM PT RETURN 9FT ADLT (ELECTROSURGICAL) ×3
ELECTRODE REM PT RTRN 9FT ADLT (ELECTROSURGICAL) ×2 IMPLANT
GLOVE BIO SURGEON STRL SZ7.5 (GLOVE) ×3 IMPLANT
GOWN STRL REUS W/ TWL LRG LVL3 (GOWN DISPOSABLE) ×4 IMPLANT
GOWN STRL REUS W/ TWL XL LVL3 (GOWN DISPOSABLE) ×2 IMPLANT
GOWN STRL REUS W/TWL LRG LVL3 (GOWN DISPOSABLE) ×6
GOWN STRL REUS W/TWL XL LVL3 (GOWN DISPOSABLE) ×3
GRASPER SUT TROCAR 14GX15 (MISCELLANEOUS) ×3 IMPLANT
IV CATH 14GX2 1/4 (CATHETERS) ×3 IMPLANT
KIT BASIN OR (CUSTOM PROCEDURE TRAY) ×3 IMPLANT
KIT TURNOVER KIT B (KITS) ×3 IMPLANT
NDL INSUFFLATION 14GA 120MM (NEEDLE) ×1 IMPLANT
NEEDLE INSUFFLATION 14GA 120MM (NEEDLE) ×3 IMPLANT
NS IRRIG 1000ML POUR BTL (IV SOLUTION) ×3 IMPLANT
PAD ARMBOARD 7.5X6 YLW CONV (MISCELLANEOUS) ×6 IMPLANT
POUCH LAPAROSCOPIC INSTRUMENT (MISCELLANEOUS) ×3 IMPLANT
POUCH RETRIEVAL ECOSAC 10 (ENDOMECHANICALS) ×1 IMPLANT
POUCH RETRIEVAL ECOSAC 10MM (ENDOMECHANICALS) ×3
SCISSORS LAP 5X35 DISP (ENDOMECHANICALS) ×3 IMPLANT
SET CHOLANGIOGRAPHY FRANKLIN (SET/KITS/TRAYS/PACK) ×1 IMPLANT
SET IRRIG TUBING LAPAROSCOPIC (IRRIGATION / IRRIGATOR) ×3 IMPLANT
SET TUBE SMOKE EVAC HIGH FLOW (TUBING) ×3 IMPLANT
SLEEVE ENDOPATH XCEL 5M (ENDOMECHANICALS) ×3 IMPLANT
SPECIMEN JAR SMALL (MISCELLANEOUS) ×3 IMPLANT
STOPCOCK 4 WAY LG BORE MALE ST (IV SETS) ×3 IMPLANT
SUT MNCRL AB 4-0 PS2 18 (SUTURE) ×3 IMPLANT
SUT VICRYL 0 UR6 27IN ABS (SUTURE) ×2 IMPLANT
TOWEL GREEN STERILE (TOWEL DISPOSABLE) ×3 IMPLANT
TOWEL GREEN STERILE FF (TOWEL DISPOSABLE) ×3 IMPLANT
TRAY LAPAROSCOPIC MC (CUSTOM PROCEDURE TRAY) ×3 IMPLANT
TROCAR XCEL NON-BLD 11X100MML (ENDOMECHANICALS) ×3 IMPLANT
TROCAR XCEL NON-BLD 5MMX100MML (ENDOMECHANICALS) ×3 IMPLANT
WARMER LAPAROSCOPE (MISCELLANEOUS) ×3 IMPLANT
WATER STERILE IRR 1000ML POUR (IV SOLUTION) ×3 IMPLANT

## 2021-04-04 NOTE — Progress Notes (Signed)
Talked to pharmacy regarding safety of pregnant patient taking pre op dose of gabapentin 300 mg; transferred to [facility] department. They stated not enough evidence for safe administration. Patient to be made aware.

## 2021-04-04 NOTE — Anesthesia Procedure Notes (Signed)
Procedure Name: Intubation Date/Time: 04/04/2021 9:45 AM Performed by: Waynard Edwards, CRNA Pre-anesthesia Checklist: Patient identified, Emergency Drugs available, Suction available and Patient being monitored Patient Re-evaluated:Patient Re-evaluated prior to induction Oxygen Delivery Method: Circle system utilized Preoxygenation: Pre-oxygenation with 100% oxygen Induction Type: IV induction Ventilation: Mask ventilation without difficulty Laryngoscope Size: Miller and 2 Grade View: Grade I Tube type: Oral Tube size: 7.0 mm Number of attempts: 1 Airway Equipment and Method: Stylet Placement Confirmation: ETT inserted through vocal cords under direct vision,  positive ETCO2 and breath sounds checked- equal and bilateral Secured at: 21 cm Tube secured with: Tape Dental Injury: Teeth and Oropharynx as per pre-operative assessment

## 2021-04-04 NOTE — Progress Notes (Signed)
Day of Surgery   Subjective/Chief Complaint: Doing well today, some minimal pain.   Objective: Vital signs in last 24 hours: Temp:  [98 F (36.7 C)-99 F (37.2 C)] 98 F (36.7 C) (06/06 0553) Pulse Rate:  [63-97] 63 (06/06 0553) Resp:  [15-20] 17 (06/06 0553) BP: (107-134)/(68-87) 107/68 (06/06 0553) SpO2:  [95 %-100 %] 98 % (06/06 0553)    Intake/Output from previous day: 06/05 0701 - 06/06 0700 In: 100 [IV Piggyback:100] Out: -  Intake/Output this shift: No intake/output data recorded.  PE:  Constitutional: No acute distress, conversant, appears states age. Eyes: Anicteric sclerae, moist conjunctiva, no lid lag Lungs: Clear to auscultation bilaterally, normal respiratory effort CV: regular rate and rhythm, no murmurs, no peripheral edema, pedal pulses 2+ GI: Soft, no masses or hepatosplenomegaly, minimal-tender to palpation epigastrium/right upper quadrant Skin: No rashes, palpation reveals normal turgor Psychiatric: appropriate judgment and insight, oriented to person, place, and time   Lab Results:  Recent Labs    04/03/21 1244 04/04/21 0712  WBC 8.9 7.6  HGB 12.3 12.0  HCT 37.8 37.6  PLT 356 303   BMET Recent Labs    04/03/21 1244  NA 135  K 3.8  CL 105  CO2 23  GLUCOSE 91  BUN 5*  CREATININE 0.66  CALCIUM 9.1   PT/INR No results for input(s): LABPROT, INR in the last 72 hours. ABG No results for input(s): PHART, HCO3 in the last 72 hours.  Invalid input(s): PCO2, PO2  Studies/Results: US OB LESS THAN 14 WEEKS WITH OB TRANSVAGINAL  Result Date: 04/03/2021 CLINICAL DATA:  Abdominal pain in first trimester of pregnancy, unknown LMP, quantitative beta HCG = 5995 EXAM: OBSTETRIC <14 WK [place] AND TRANSVAGINAL OB US TECHNIQUE: Both transabdominal and transvaginal ultrasound examinations were performed for complete evaluation of the gestation as well as the maternal uterus, adnexal regions, and pelvic cul-de-sac. Transvaginal technique was performed to  assess early pregnancy. COMPARISON:  None FINDINGS: Intrauterine gestational sac: Present, single Yolk sac:  Present Embryo:  Not identified Cardiac Activity: N/A Heart Rate: N/A  bpm MSD: 7 mm   5 w   2 d Subchorionic hemorrhage:  Small subchronic hemorrhage Maternal uterus/adnexae: Uterus anteverted, otherwise unremarkable. RIGHT ovary normal size and morphology 3.5 x 2.4 x 2.5 cm. LEFT ovary normal size and morphology 2.8 x 1.4 x 1.6 cm. No free pelvic fluid or adnexal masses. IMPRESSION: Small gestational sac within uterus with mean sac diameter of 5 weeks 2 days EGA. Small subchronic hemorrhage. No fetal pole identified to establish viability; may consider follow-up ultrasound in 14 days to establish viability if clinically indicated. Electronically Signed   By: Ulyses Southward M.D.   On: 04/03/2021 14:06   US Abdomen Limited RUQ (LIVER/GB)  Result Date: 04/03/2021 CLINICAL DATA:  Right upper quadrant pain EXAM: ULTRASOUND ABDOMEN LIMITED RIGHT UPPER QUADRANT COMPARISON:  Multiple prior ultrasounds including Mar 25, 2021, January 13, 2021 and December 06, 2020. FINDINGS: Gallbladder: Numerous cholelithiasis measuring up to 4 mm. No pericholecystic fluid or gallbladder wall thickening. Possible sonographic Murphy sign noted by sonographer. Common bile duct: Diameter: Similar enlargement of the common bile duct measuring 9 mm on ultrasound Liver: No focal lesion identified. Within normal limits in parenchymal echogenicity. Portal vein is patent on color Doppler imaging with normal direction of blood flow towards the liver. Other: None. IMPRESSION: 1. Dilation of common bile duct measuring up to 9 mm, similar multiple prior ultrasounds. Recommend correlation with laboratory values to assess for biliary obstruction. 2.  Cholelithiasis without wall thickening or pericholecystic fluid however a possible positive sonographic Murphy sign noted by sonographer. Findings which are equivocal for acute cholecystitis. If  continued clinical concern for acute cholecystitis suggest further evaluation with nuclear medicine HIDA scan to assess cystic duct patency. Electronically Signed   By: Maudry Mayhew MD   On: 04/03/2021 16:24    Anti-infectives: Anti-infectives (From admission, onward)   Start     Dose/Rate Route Frequency Ordered Stop   04/04/21 0845  ceFAZolin (ANCEF) IVPB 2g/100 mL premix        2 g 200 mL/hr over 30 Minutes Intravenous On call to O.R. 04/04/21 1761 04/05/21 0559   04/04/21 0838  ceFAZolin (ANCEF) 2-4 GM/100ML-% IVPB       Note to Pharmacy: Randa Ngo   : cabinet override      04/04/21 0838 04/04/21 2044   04/04/21 0100  [MAR Hold]  cefTRIAXone (ROCEPHIN) 2 g in sodium chloride 0.9 % 100 mL IVPB        (MAR Hold since Mon 04/04/2021 at 0829.Hold Reason: Transfer to a Procedural area.)   2 g 200 mL/hr over 30 Minutes Intravenous Daily at bedtime 04/04/21 0030        Assessment/Plan: 25 year old female with 5-week pregnancy, acute cholecystitis  1.  We will proceed to the operating for lap chole, possible IOC 2. All risks and benefits were discussed with the patient to generally include: infection, bleeding, possible need for post op ERCP, damage to the bile ducts, and bile leak. Alternatives were offered and described.  All questions were answered and the patient voiced understanding of the procedure and wishes to proceed at this point with a laparoscopic cholecystectomy.  Also discussed that secondary to the fact that this is her first trimester she has a higher risk of spontaneous abortion, possible birth defects, possible further birth complications.  Patient voiced understanding wish to proceed to the OR.   LOS: 0 days    Axel Filler 04/04/2021

## 2021-04-04 NOTE — Transfer of Care (Signed)
Immediate Anesthesia Transfer of Care Note  Patient: Kayla [name]  Procedure(s) Performed: LAPAROSCOPIC CHOLECYSTECTOMY (N/A Abdomen)  Patient Location: PACU  Anesthesia Type:General  Level of Consciousness: awake, alert , oriented and patient cooperative  Airway & Oxygen Therapy: Patient Spontanous Breathing and Patient connected to nasal cannula oxygen  Post-op Assessment: Report given to RN, Post -op Vital signs reviewed and stable and Patient moving all extremities X 4  Post vital signs: Reviewed and stable  Last Vitals:  Vitals Value Taken Time  BP 93/68 04/04/21 1042  Temp    Pulse 107 04/04/21 1043  Resp 25 04/04/21 1043  SpO2 94 % 04/04/21 1043  Vitals shown include unvalidated device data.  Last Pain:  Vitals:   04/04/21 0848  TempSrc: Oral  PainSc: 4       Patients Stated Pain Goal: 0 (04/03/21 1505)  Complications: No complications documented.

## 2021-04-04 NOTE — Progress Notes (Signed)
Pt called out to front desk and stated that she had to use the bathroom. RN went in and helped escort the patient to the bathroom, when we sat up on the side of the bed the pt stated that she felt dizzy sat there for a couple minutes and felt better. Ambulated to the BR with 1 assist and the walker, while sitting on the toilet she stated that she felt as if she was going to pass out, her mother stated that usually when she says that she passes out. RN called out to [facility], RN for help and she came collected pts vitals signs and they were stable. Pt was oriented x4 stated she felt dizzy and in pain but could get back to the bed. Ambulated back to bed and got her comfortable as possible stated she felt better when she laid down was just in pain, told her she just had pain medication and it was not time. Ordered a K pad from portable to help. DR. Derrell Lolling paged and PA [facility] secure chatted.

## 2021-04-04 NOTE — Progress Notes (Signed)
Pt returned to the floor from PACU and woke up complaining of 10/10 pain, all her pain medication. MD paged and notified Brooke,PA placed PRN medication for the patient.

## 2021-04-04 NOTE — Op Note (Signed)
04/04/2021  10:32 AM  PATIENT:  Kayla [name]  25 y.o. female  PRE-OPERATIVE DIAGNOSIS:  BILIARY COLIC  POST-OPERATIVE DIAGNOSIS:  BILIARY COLIC, ACUTE CHOLECYSTITIS  PROCEDURE:  Procedure(s): LAPAROSCOPIC CHOLECYSTECTOMY (N/A)  SURGEON:  Surgeon(s) and Role:    Axel Filler, MD - Primary   ASSISTANTS: none   ANESTHESIA:   local and general  EBL:  5 mL   BLOOD ADMINISTERED:none  DRAINS: none   LOCAL MEDICATIONS USED:  BUPIVICAINE   SPECIMEN:  Source of Specimen:  GALLBLADDER  DISPOSITION OF SPECIMEN:  PATHOLOGY  COUNTS:  YES  TOURNIQUET:  * No tourniquets in log *  DICTATION: .Dragon Dictation  EBL: <5cc   Complications: none   Counts: reported as correct x 2   Findings:very inflammed and edematous gallbladder  Indications for procedure: Pt is a 46F with RUQ pain and seen to have gallstones.   Details of the procedure: The patient was taken to the operating and placed in the supine position with bilateral SCDs in place. A time out was called and all facts were verified. A pneumoperitoneum was obtained via A Veress needle technique to a pressure of 62mm of mercury. A 32mm trochar was then placed in the right upper quadrant under visualization, and there were no injuries to any abdominal organs. A 11 mm port was then placed in the umbilical region after infiltrating with local anesthesia under direct visualization. A second epigastric port was placed under direct visualization.   The gallbladder was identified and seen to be very edematous.  It was grapsed and retracted, the peritoneum was then sharply dissected from the gallbladder and this dissection was carried down to Calot's triangle. The cystic duct was identified and dissected circumferentially and seen going into the gallbladder 360.  The cystic artery was dissected away from the surrounding tissues.   The critical angle was obtained.    2 clips were placed proximally one distally and the cystic  duct transected. The cystic artery was identified and 2 clips placed proximally and one distally and transected. We then proceeded to remove the gallbladder off the hepatic fossa with Bovie cautery. A retrieval bag was then placed in the abdomen and gallbladder placed in the bag. The hepatic fossa was then reexamined and hemostasis was achieved with Bovie cautery and was excellent at this portion of the case. The subhepatic fossa and perihepatic fossa was then irrigated until the effluent was clear. The specimen bag and specimen were removed from the abdominal cavity.  The 11 mm trocar fascia was reapproximated with the Endo Close #1 Vicryl x3. The pneumoperitoneum was evacuated and all trochars removed under direct visulalization. The skin was then closed with 4-0 Monocryl and the skin dressed with Dermabond. The patient was awaken from general anesthesia and taken to the recovery room in stable condition.     PLAN OF CARE: Discharge to home today  PATIENT DISPOSITION:  PACU - hemodynamically stable.   Delay start of Pharmacological VTE agent (>24hrs) due to surgical blood loss or risk of bleeding: not applicable

## 2021-04-04 NOTE — Anesthesia Postprocedure Evaluation (Signed)
Anesthesia Post Note  Patient: Kayla [name]  Procedure(s) Performed: LAPAROSCOPIC CHOLECYSTECTOMY (N/A Abdomen)     Patient location during evaluation: PACU Anesthesia Type: General Level of consciousness: awake Pain management: pain level controlled Vital Signs Assessment: post-procedure vital signs reviewed and stable Respiratory status: spontaneous breathing, nonlabored ventilation, respiratory function stable and patient connected to nasal cannula oxygen Cardiovascular status: blood pressure returned to baseline and stable Postop Assessment: no apparent nausea or vomiting Anesthetic complications: no   No complications documented.  Last Vitals:  Vitals:   04/04/21 1832 04/04/21 1935  BP: 120/65 129/83  Pulse: (!) 102 100  Resp: 16 14  Temp: 36.9 C 36.7 C  SpO2: 99% 97%    Last Pain:  Vitals:   04/04/21 1935  TempSrc: Oral  PainSc:                  Catheryn Bacon Hailie Searight

## 2021-04-05 ENCOUNTER — Encounter (HOSPITAL_COMMUNITY): Payer: Self-pay | Admitting: General Surgery

## 2021-04-05 ENCOUNTER — Telehealth: Payer: Self-pay | Admitting: Women's Health

## 2021-04-05 DIAGNOSIS — A749 Chlamydial infection, unspecified: Secondary | ICD-10-CM | POA: Insufficient documentation

## 2021-04-05 DIAGNOSIS — O98811 Other maternal infectious and parasitic diseases complicating pregnancy, first trimester: Secondary | ICD-10-CM

## 2021-04-05 LAB — SURGICAL PATHOLOGY

## 2021-04-05 MED ORDER — OXYCODONE HCL 5 MG PO TABS: 5.0000 mg | ORAL_TABLET | Freq: Four times a day (QID) | ORAL | 0 refills | Status: DC | PRN

## 2021-04-05 MED ORDER — ACETAMINOPHEN 500 MG PO TABS: 1000.0000 mg | ORAL_TABLET | Freq: Four times a day (QID) | ORAL | 0 refills | Status: AC | PRN

## 2021-04-05 MED ORDER — AZITHROMYCIN 500 MG PO TABS: 1000.0000 mg | ORAL_TABLET | Freq: Once | ORAL | 0 refills | Status: AC

## 2021-04-05 MED ORDER — DOCUSATE SODIUM 100 MG PO CAPS: 100.0000 mg | ORAL_CAPSULE | Freq: Two times a day (BID) | ORAL | 0 refills | Status: DC

## 2021-04-05 NOTE — Discharge Instructions (Signed)
[facility] LAPAROSCOPIC SURGERY: POST OP INSTRUCTIONS Always review your discharge instruction sheet given to you by the facility where your surgery was performed. IF YOU HAVE DISABILITY OR FAMILY LEAVE FORMS, YOU MUST BRING THEM TO THE OFFICE FOR PROCESSING.   DO NOT GIVE THEM TO YOUR DOCTOR.  PAIN CONTROL  1. First take acetaminophen (Tylenol) to control your pain after surgery.  Follow directions on package.  Taking acetaminophen (Tylenol) regularly after surgery will help to control your pain and lower the amount of prescription pain medication you may need.  You should not take more than 3,000-4,000 mg (3-4 grams) of acetaminophen (Tylenol) in 24 hours.  You should not take ibuprofen (Advil), aleve, motrin, naprosyn or other NSAIDS if you have a history of stomach ulcers or chronic kidney disease; or if you are pregnant.  2. A prescription for pain medication may be given to you upon discharge.  Take your pain medication as prescribed, if you still have uncontrolled pain after taking acetaminophen (Tylenol) or using ice/heat. 3. Use ice packs to help control pain. 4. If you need a refill on your pain medication, please contact your pharmacy.  They will contact our office to request authorization. Prescriptions will not be filled after 5pm or on week-ends.  HOME MEDICATIONS 5. Take your usually prescribed medications unless otherwise directed.  DIET 6. You should follow a light diet the first few days after arrival home.  Be sure to include lots of fluids daily. Avoid fatty, fried foods.   CONSTIPATION 7. It is common to experience some constipation after surgery and if you are taking pain medication.  Increasing fluid intake and taking a stool softener (such as Colace) will usually help or prevent this problem from occurring.  A mild laxative (Milk of Magnesia or Miralax) should be taken according to package instructions if there are no bowel movements after 48  hours.  WOUND/INCISION CARE 8. Most patients will experience some swelling and bruising in the area of the incisions.  Ice packs will help.  Swelling and bruising can take several days to resolve.  9. Unless discharge instructions indicate otherwise, follow guidelines below  a. STERI-STRIPS - you may remove your outer bandages 48 hours after surgery, and you may shower at that time.  You have steri-strips (small skin tapes) in place directly over the incision.  These strips should be left on the skin for 7-10 days.   b. DERMABOND/SKIN GLUE - you may shower in 24 hours.  The glue will flake off over the next 2-3 weeks. 10. Any sutures or staples will be removed at the office during your follow-up visit.  ACTIVITIES 11. You may resume regular (light) daily activities beginning the next day--such as daily self-care, walking, climbing stairs--gradually increasing activities as tolerated.  You may have sexual intercourse when it is comfortable.  Refrain from any heavy lifting or straining until approved by your doctor. a. You may drive when you are no longer taking prescription pain medication, you can comfortably wear a seatbelt, and you can safely maneuver your car and apply brakes.  FOLLOW-UP 12. You should see your doctor in the office for a follow-up appointment approximately 2-3 weeks after your surgery.  You should have been given your post-op/follow-up appointment when your surgery was scheduled.  If you did not receive a post-op/follow-up appointment, make sure that you call for this appointment within a day or two after you arrive home to insure a convenient appointment time.  WHEN TO  CALL YOUR DOCTOR: 1. Fever over 101.0 2. Inability to urinate 3. Continued bleeding from incision. 4. Increased pain, redness, or drainage from the incision. 5. Increasing abdominal pain  The clinic staff is available to answer your questions during regular business hours.  Please don't hesitate to call and  ask to speak to one of the nurses for clinical concerns.  If you have a medical emergency, go to the nearest emergency room or call 911.  A surgeon from [facility] is always on call at the hospital. [address], [place], [place]  [address] ? [address] [address], [place], [place]   [address] [phone] ? [phone] ? FAX [phone] Web site: [email or web address]

## 2021-04-05 NOTE — Discharge Summary (Signed)
[facility] Discharge Summary   Patient ID: Kayla [name] MRN: [id number] DOB/AGE: 01/27/1996 24 y.o.  Admit date: 04/03/2021 Discharge date: 04/05/2021  Discharge Diagnosis Patient Active Problem List   Diagnosis Date Noted  . Cholecystitis 04/03/2021  . Post-dates pregnancy 05/06/2020    Imaging: US OB LESS THAN 14 WEEKS WITH OB TRANSVAGINAL  Result Date: 04/03/2021 CLINICAL DATA:  Abdominal pain in first trimester of pregnancy, unknown LMP, quantitative beta HCG = 5995 EXAM: OBSTETRIC <14 WK [place] AND TRANSVAGINAL OB US TECHNIQUE: Both transabdominal and transvaginal ultrasound examinations were performed for complete evaluation of the gestation as well as the maternal uterus, adnexal regions, and pelvic cul-de-sac. Transvaginal technique was performed to assess early pregnancy. COMPARISON:  None FINDINGS: Intrauterine gestational sac: Present, single Yolk sac:  Present Embryo:  Not identified Cardiac Activity: N/A Heart Rate: N/A  bpm MSD: 7 mm   5 w   2 d Subchorionic hemorrhage:  Small subchronic hemorrhage Maternal uterus/adnexae: Uterus anteverted, otherwise unremarkable. RIGHT ovary normal size and morphology 3.5 x 2.4 x 2.5 cm. LEFT ovary normal size and morphology 2.8 x 1.4 x 1.6 cm. No free pelvic fluid or adnexal masses. IMPRESSION: Small gestational sac within uterus with mean sac diameter of 5 weeks 2 days EGA. Small subchronic hemorrhage. No fetal pole identified to establish viability; may consider follow-up ultrasound in 14 days to establish viability if clinically indicated. Electronically Signed   By: Ulyses Southward M.D.   On: 04/03/2021 14:06   US Abdomen Limited RUQ (LIVER/GB)  Result Date: 04/03/2021 CLINICAL DATA:  Right upper quadrant pain EXAM: ULTRASOUND ABDOMEN LIMITED RIGHT UPPER QUADRANT COMPARISON:  Multiple prior ultrasounds including Mar 25, 2021, January 13, 2021 and December 06, 2020. FINDINGS: Gallbladder: Numerous cholelithiasis measuring up to 4 mm.  No pericholecystic fluid or gallbladder wall thickening. Possible sonographic Murphy sign noted by sonographer. Common bile duct: Diameter: Similar enlargement of the common bile duct measuring 9 mm on ultrasound Liver: No focal lesion identified. Within normal limits in parenchymal echogenicity. Portal vein is patent on color Doppler imaging with normal direction of blood flow towards the liver. Other: None. IMPRESSION: 1. Dilation of common bile duct measuring up to 9 mm, similar multiple prior ultrasounds. Recommend correlation with laboratory values to assess for biliary obstruction. 2. Cholelithiasis without wall thickening or pericholecystic fluid however a possible positive sonographic Murphy sign noted by sonographer. Findings which are equivocal for acute cholecystitis. If continued clinical concern for acute cholecystitis suggest further evaluation with nuclear medicine HIDA scan to assess cystic duct patency. Electronically Signed   By: Maudry Mayhew MD   On: 04/03/2021 16:24    Procedures Dr. Axel Filler 04/04/21- Laparoscopic Cholecystectomy    Hospital Course:  25 y/o F with history of biliary colic previously scheduled for elective lap chole 11/2020 (cancelled due to positive pregnancy test).  The pregnancy ended in miscarriage and she rescheduled surgery for 04/04/2021 with pre-op labs scheduled for 03/26/2021. She presented to the ED 03/25/2021 with biliary symptoms and again was noted to have a +UPT with subsequent uptrending betas consistent with a viable pregnancy. TVUS with gestational sac c/w IUP at 5+2 EGA, but no fetal pole visible to be able to confirm viability. Workup showed mild transaminitis and hyperbilirubinemia.  Patient was counseled extensively on the risks of surgery during first trimester of pregnancy. Due to the frequency and severity of her biliary sxs elected to proceed with laparoscopic cholecystectomy as above. admitted and underwent procedure listed above.  Tolerated  procedure well and was transferred to the floor.  Diet was advanced as tolerated.  On POD#1, the patient was voiding well, tolerating diet, ambulating well, pain well controlled, vital signs stable, incisions c/d/i and felt stable for discharge home.  Patient will follow up in our office in 2 weeks and knows to call with questions or concerns.  She was also counseled to scheduled follow up with her OBGYN  Physical Exam: General:  Alert, NAD, pleasant, comfortable Abd:  Soft, ND, mild tenderness, incisions C/D/I   Allergies as of 04/05/2021      Reactions   Shrimp [shellfish Allergy] Nausea And Vomiting, Rash   PT states she also gets a fever       Medication List    STOP taking these medications   cyclobenzaprine 10 MG tablet Commonly known as: FLEXERIL     TAKE these medications   acetaminophen 500 MG tablet Commonly known as: TYLENOL Take 2 tablets (1,000 mg total) by mouth every 6 (six) hours as needed for up to 5 days for mild pain. What changed:   how much to take  reasons to take this   docusate sodium 100 MG capsule Commonly known as: COLACE Take 1 capsule (100 mg total) by mouth 2 (two) times daily.   multivitamin-prenatal 27-0.8 MG Tabs tablet Take 1 tablet by mouth daily at 12 noon.   OVER THE COUNTER MEDICATION Take 2 capsules by mouth daily as needed (constipation). HUMS stool softener/laxative   oxyCODONE 5 MG immediate release tablet Commonly known as: Oxy IR/ROXICODONE Take 1 tablet (5 mg total) by mouth every 6 (six) hours as needed for moderate pain or severe pain (5 mg moderate).         Follow-up Information    [facility].   Specialty: General Surgery Why: please call to confirm appointment date/time. please arrive 20 minutes early to get checked in and fill out any necessary paperwork Contact information: [address] [place] [address] [phone]               Signed: Hosie Spangle,  [facility] 04/05/2021, 10:11 AM

## 2021-04-05 NOTE — Telephone Encounter (Signed)
Patient called about +CT results and identified by two identifiers. Patient also had +CT on mid-April 2022 and reports she took the medication for CT the day or day after it was prescribed, which was on 02/15/2021. Patient reports she told her partner to get treated, but did not receive confirmation that he was, and she has had intercourse with that same partner since she received treatment in April. Patient advised she will need to be retreated and that her partner should get tested and treated as well. Patient advised to refrain from intercourse from today until 7 days after her partner is treated to avoid reinfection. Patient confirms only allergy is to shrimp, and patient is currently taking oxycodone, Tylenol, colace and PNV as patient was just discharged home from the hospital s/p gallbladder removal yesterday. Patient verbalizes understanding and questions answered to pt satisfaction. RX Azithromycin 1000mg  sent.  , NP  11:49 AM 04/05/2021

## 2021-04-05 NOTE — Progress Notes (Signed)
Discharge teaching complete. Meds, diet,activity, follow up appointments and incision care reviewed and all questions answered. Copy of instructions given to patient and prescriptions sent to pharmacy. Patient discharged home via wheelchair.  

## 2021-04-18 LAB — OB RESULTS CONSOLE GC/CHLAMYDIA
Chlamydia: NEGATIVE
Gonorrhea: NEGATIVE

## 2021-05-10 LAB — OB RESULTS CONSOLE HEPATITIS B SURFACE ANTIGEN: Hepatitis B Surface Ag: NEGATIVE

## 2021-05-10 LAB — OB RESULTS CONSOLE RUBELLA ANTIBODY, IGM: Rubella: IMMUNE

## 2021-05-10 LAB — OB RESULTS CONSOLE RPR: RPR: NONREACTIVE

## 2021-05-10 LAB — HEPATITIS C ANTIBODY: HCV Ab: NEGATIVE

## 2021-07-13 LAB — OB RESULTS CONSOLE GBS: GBS: POSITIVE

## 2021-07-28 ENCOUNTER — Other Ambulatory Visit: Payer: Self-pay

## 2021-07-28 ENCOUNTER — Ambulatory Visit
Admission: EM | Admit: 2021-07-28 | Discharge: 2021-07-28 | Disposition: A | Discharge status: Home / Self Care | Payer: Medicaid Other | Attending: Internal Medicine | Admitting: Internal Medicine

## 2021-07-28 DIAGNOSIS — H1031 Unspecified acute conjunctivitis, right eye: Secondary | ICD-10-CM

## 2021-07-28 MED ORDER — ERYTHROMYCIN 5 MG/GM OP OINT: TOPICAL_OINTMENT | OPHTHALMIC | 0 refills | Status: DC

## 2021-07-28 NOTE — ED Provider Notes (Signed)
[facility]    CSN: [id number] Arrival date & time: 07/28/21  1709      History   Chief Complaint Chief Complaint  Patient presents with   right eye redness    HPI Kayla [name] is a 25 y.o. female.   Patient presents with right eye redness and discomfort that has been present since this morning upon awakening.  Also has some white discharge from eye.  Denies any eye pain or blurry vision.  Denies any injury to the eye or getting any foreign bodies in the eye.  Also has some runny nose that started this morning.  Denies any known fevers.  Denies wearing contacts but does wear glasses.  Patient reports that Kayla [name] is 5 months pregnant currently.    Past Medical History:  Diagnosis Date   Anemia    pregnancy   Gallstones 10/2020   Headache     Patient Active Problem List   Diagnosis Date Noted   Chlamydia infection affecting pregnancy in first trimester 04/05/2021   Cholecystitis 04/03/2021   Post-dates pregnancy 05/06/2020    Past Surgical History:  Procedure Laterality Date   CHOLECYSTECTOMY N/A 04/04/2021   Procedure: LAPAROSCOPIC CHOLECYSTECTOMY;  Surgeon: Axel Filler, MD;  Location: [facility] OR;  Service: General;  Laterality: N/A;   NO PAST SURGERIES      OB History     Gravida  3   Para  1   Term  1   Preterm  0   AB  1   Living  1      SAB  1   IAB  0   Ectopic  0   Multiple  0   Live Births  1            Home Medications    Prior to Admission medications   Medication Sig Start Date End Date Taking? Authorizing Provider  erythromycin ophthalmic ointment Place a 1/2 inch ribbon of ointment into the lower eyelid 4 times daily for 7 days. 07/28/21  Yes Lance Muss, FNP  docusate sodium (COLACE) 100 MG capsule Take 1 capsule (100 mg total) by mouth 2 (two) times daily. 04/05/21   Adam Phenix, PA-C  OVER THE COUNTER MEDICATION Take 2 capsules by mouth daily as needed (constipation). HUMS stool  softener/laxative    [provider]  oxyCODONE (OXY IR/ROXICODONE) 5 MG immediate release tablet Take 1 tablet (5 mg total) by mouth every 6 (six) hours as needed for moderate pain or severe pain (5 mg moderate). 04/05/21   Adam Phenix, PA-C  Prenatal Vit-Fe Fumarate-FA (MULTIVITAMIN-PRENATAL) 27-0.8 MG TABS tablet Take 1 tablet by mouth daily at 12 noon.    [provider]  promethazine (PHENERGAN) 25 MG tablet Take 1 tablet (25 mg total) by mouth every 6 (six) hours as needed for nausea or vomiting. Patient not taking: No sig reported 01/13/21 03/25/21  Aviva Signs, CNM    Family History Family History  Problem Relation Age of Onset   Hypertension Mother    Other Mother        lymphodema   Hypertension Maternal Grandmother    Stroke Maternal Grandmother    Hypertension Maternal Grandfather    Other Maternal Grandfather        blood clotting disorder    Social History Social History   Tobacco Use   Smoking status: Never   Smokeless tobacco: Never  Vaping Use   Vaping Use: Never used  Substance Use Topics  Alcohol use: Not Currently   Drug use: Never     Allergies   Shrimp [shellfish allergy]   Review of Systems Review of Systems Per HPI  Physical Exam Triage Vital Signs ED Triage Vitals [07/28/21 1719]  Enc Vitals Group     BP 125/85     Pulse Rate 95     Resp 18     Temp 98.1 F (36.7 C)     Temp Source Oral     SpO2 97 %     Weight      Height      Head Circumference      Peak Flow      Pain Score 0     Pain Loc      Pain Edu?      Excl. in GC?    No data found.  Updated Vital Signs BP 125/85 (BP Location: Left Arm)   Pulse 95   Temp 98.1 F (36.7 C) (Oral)   Resp 18   LMP 12/08/2020   SpO2 97%   Breastfeeding No   Visual Acuity Right Eye Distance: 20/40 Left Eye Distance: 20/25 Bilateral Distance: 20/25  Right Eye Near:   Left Eye Near:    Bilateral Near:     Physical Exam Constitutional:       Appearance: Normal appearance.  HENT:     Head: Normocephalic and atraumatic.  Eyes:     General: Lids are normal. Lids are everted, no foreign bodies appreciated.     Extraocular Movements: Extraocular movements intact.     Conjunctiva/sclera:     Right eye: Right conjunctiva is injected. Exudate present. No chemosis or hemorrhage.    Pupils: Pupils are equal, round, and reactive to light.     Comments: Purulent discharge noted from right eye.  Scleral redness also present.  Pulmonary:     Effort: Pulmonary effort is normal.  Neurological:     General: No focal deficit present.     Mental Status: Kayla [name] is alert and oriented to person, place, and time. Mental status is at baseline.  Psychiatric:        Mood and Affect: Mood normal.        Behavior: Behavior normal.        Thought Content: Thought content normal.        Judgment: Judgment normal.     UC Treatments / Results  Labs (all labs ordered are listed, but only abnormal results are displayed) Labs Reviewed - No data to display  EKG   Radiology No results found.  Procedures Procedures (including critical care time)  Medications Ordered in UC Medications - No data to display  Initial Impression / Assessment and Plan / UC Course  I have reviewed the triage vital signs and the nursing notes.  Pertinent labs & imaging results that were available during my care of the patient were reviewed by me and considered in my medical decision making (see chart for details).     Physical exam is most consistent with right bacterial conjunctivitis.  No suspicion for orbital cellulitis or injury to the eye.  Will treat with erythromycin antibiotic ointment as this is safe with pregnancy.  Visual acuity fairly normal on exam.  Patient reports that this visual acuity is normal for her as her new glasses have not arrived with her new prescription.Discussed strict return precautions. Patient verbalized understanding and is agreeable with  plan.  Final Clinical Impressions(s) / UC Diagnoses   Final diagnoses:  Acute  bacterial conjunctivitis of right eye     Discharge Instructions      You have pinkeye of your right eye that is being treated with erythromycin antibiotic ointment.     ED Prescriptions     Medication Sig Dispense Auth. Provider   erythromycin ophthalmic ointment Place a 1/2 inch ribbon of ointment into the lower eyelid 4 times daily for 7 days. 3.5 g Lance Muss, FNP      PDMP not reviewed this encounter.   Lance Muss, FNP 07/28/21 1753

## 2021-07-28 NOTE — ED Triage Notes (Signed)
Pt c/o right eye redness first noticed upon wakening this morning. States she has "eye boogers" "whitish yellow" discharge and a pressure sensation. Denis itching. States she is pregnant.

## 2021-07-28 NOTE — Discharge Instructions (Addendum)
You have pinkeye of your right eye that is being treated with erythromycin antibiotic ointment.

## 2021-08-22 ENCOUNTER — Inpatient Hospital Stay (HOSPITAL_COMMUNITY)
Admission: AD | Admit: 2021-08-22 | Discharge: 2021-08-22 | Disposition: A | Discharge status: Home / Self Care | Payer: Medicaid Other | Attending: Obstetrics | Admitting: Obstetrics

## 2021-08-22 ENCOUNTER — Encounter (HOSPITAL_COMMUNITY): Payer: Self-pay | Admitting: Obstetrics

## 2021-08-22 DIAGNOSIS — H1089 Other conjunctivitis: Secondary | ICD-10-CM | POA: Diagnosis not present

## 2021-08-22 DIAGNOSIS — Z3A25 25 weeks gestation of pregnancy: Secondary | ICD-10-CM | POA: Diagnosis not present

## 2021-08-22 DIAGNOSIS — H5789 Other specified disorders of eye and adnexa: Secondary | ICD-10-CM | POA: Diagnosis not present

## 2021-08-22 DIAGNOSIS — B9689 Other specified bacterial agents as the cause of diseases classified elsewhere: Secondary | ICD-10-CM | POA: Diagnosis not present

## 2021-08-22 DIAGNOSIS — O26892 Other specified pregnancy related conditions, second trimester: Secondary | ICD-10-CM

## 2021-08-22 DIAGNOSIS — Z79899 Other long term (current) drug therapy: Secondary | ICD-10-CM | POA: Insufficient documentation

## 2021-08-22 DIAGNOSIS — R102 Pelvic and perineal pain: Secondary | ICD-10-CM

## 2021-08-22 DIAGNOSIS — N760 Acute vaginitis: Secondary | ICD-10-CM | POA: Diagnosis not present

## 2021-08-22 LAB — WET PREP, GENITAL
Sperm: NONE SEEN
Trich, Wet Prep: NONE SEEN
Yeast Wet Prep HPF POC: NONE SEEN

## 2021-08-22 MED ORDER — ERYTHROMYCIN 5 MG/GM OP OINT: TOPICAL_OINTMENT | OPHTHALMIC | 0 refills | Status: DC

## 2021-08-22 MED ORDER — METRONIDAZOLE 0.75 % VA GEL: 1.0000 | Freq: Every day | VAGINAL | 0 refills | Status: DC

## 2021-08-22 NOTE — MAU Note (Signed)
Kayla [name] is a 26 y.o. at [redacted]w[redacted]d here in MAU reporting: R eye redness, questioned pink eye, feels pressure behind eye and wants to make sure she does not have virus that would effect her work. Also reports that headache began behind right eye and now is generalized over whole head.  Onset of complaint: 1400 Pain score: 5 Vitals:   08/22/21 2100  BP: 114/75  Pulse: 99  Resp: 17  Temp: 98 F (36.7 C)  SpO2: 100%     FHT:150

## 2021-08-22 NOTE — MAU Provider Note (Signed)
Event Date/Time   First Provider Initiated Contact with Patient 08/22/21 2143     S [name] is a 25 y.o. [phone] pregnant female at [redacted]w[redacted]d who presents to MAU today with complaint of right eye irritation with pressure behind the eye and a developing mild headache. She was recently treated for bacterial conjunctivitis and used mascara for the first time since the infection.  Is concerned she has a virus that will prevent her from going to work tomorrow. Denies severe headache, congestion of any kind, SOB, cough, aches, or fever. Also notes some vaginal discharge and wants to make sure she doesn't have an infection. No other physical complaints.  Receives care at [facility].   Pertinent items noted in HPI and remainder of comprehensive ROS otherwise negative.   O BP 123/73 (BP Location: Right Arm)   Pulse 91   Temp 98 F (36.7 C) (Oral)   Resp 18   Ht 5\' 4"  (1.626 m)   Wt 214 lb (97.1 kg)   LMP 12/08/2020   SpO2 100%   BMI 36.73 kg/m  Physical Exam Vitals and nursing note reviewed.  Constitutional:      Appearance: Normal appearance.  HENT:     Head: Normocephalic.  Eyes:     Pupils: Pupils are equal, round, and reactive to light.     Comments: Right eye reddened and teary but no exudate or edema noted  Cardiovascular:     Rate and Rhythm: Normal rate and regular rhythm.     Pulses: Normal pulses.  Pulmonary:     Effort: Pulmonary effort is normal.  Abdominal:     Palpations: Abdomen is soft.  Genitourinary:    General: Normal vulva.     Vagina: Vaginal discharge present.  Musculoskeletal:        General: Normal range of motion.  Skin:    General: Skin is warm.     Capillary Refill: Capillary refill takes less than 2 seconds.  Neurological:     Mental Status: She is alert and oriented to person, place, and time.  Psychiatric:        Mood and Affect: Mood normal.        Behavior: Behavior normal.        Thought Content: Thought content  normal.        Judgment: Judgment normal.   Results for orders placed or performed during the hospital encounter of 08/22/21 (from the past 24 hour(s))  Wet prep, genital     Status: Abnormal   Collection Time: 08/22/21  9:56 PM  Result Value Ref Range   Yeast Wet Prep HPF POC NONE SEEN NONE SEEN   Trich, Wet Prep NONE SEEN NONE SEEN   Clue Cells Wet Prep HPF POC PRESENT (A) NONE SEEN   WBC, Wet Prep HPF POC FEW (A) NONE SEEN   Sperm NONE SEEN   GC/Chlamydia probe amp ([place])not at [facility]     Status: None   Collection Time: 08/22/21  9:56 PM  Result Value Ref Range   Neisseria Gonorrhea Negative    Chlamydia Negative    Comment Normal Reference Ranger Chlamydia - Negative    Comment      Normal Reference Range Neisseria Gonorrhea - Negative   Fetal Tracing: reactive Baseline: 140 Variability: moderate Accelerations: 15x15 Decelerations: none Toco: relaxed  A Bacterial conjunctivitis Bacterial vaginosis NST reactive  P Discharge from MAU in stable condition with return precautions Follow up at [facility] as scheduled  for ongoing prenatal care Allergies as of 08/22/2021       Reactions   Shrimp [shellfish Allergy] Nausea And Vomiting, Rash   PT states she also gets a fever         Medication List     STOP taking these medications    oxyCODONE 5 MG immediate release tablet Commonly known as: Oxy IR/ROXICODONE       TAKE these medications    docusate sodium 100 MG capsule Commonly known as: COLACE Take 1 capsule (100 mg total) by mouth 2 (two) times daily.   erythromycin ophthalmic ointment Place a 1/2 inch ribbon of ointment into the lower eyelid 4 times daily for 7 days.   metroNIDAZOLE 0.75 % vaginal gel Commonly known as: METROGEL Place 1 Applicatorful vaginally at bedtime. Apply one applicatorful to vagina at bedtime for 5 days   multivitamin-prenatal 27-0.8 MG Tabs tablet Take 1 tablet by mouth daily at 12 noon.   OVER THE COUNTER  MEDICATION Take 2 capsules by mouth daily as needed (constipation). HUMS stool softener/laxative         Bernerd Limbo, CNM 08/23/2021 9:00 PM

## 2021-08-23 LAB — GC/CHLAMYDIA PROBE AMP (~~LOC~~) NOT AT ARMC
Chlamydia: NEGATIVE
Comment: NEGATIVE
Comment: NORMAL
Neisseria Gonorrhea: NEGATIVE

## 2021-09-15 LAB — OB RESULTS CONSOLE RPR: RPR: NONREACTIVE

## 2021-10-30 NOTE — L&D Delivery Note (Signed)
Patient was C/C/+2 and pushed for approximately 5 minutes with epidural.    NSVD  female infant, Apgars 8/9, weight pending.   The patient had no laceration. Fundus was firm. EBL was expected amount. Placenta was delivered intact. Vagina was clear.  Delayed cord clamping done for 30-60 seconds while warming baby. Baby was vigorous and doing skin to skin with mother.  Philip Aspen

## 2021-12-05 ENCOUNTER — Encounter (HOSPITAL_COMMUNITY): Payer: Self-pay | Admitting: Obstetrics and Gynecology

## 2021-12-05 ENCOUNTER — Inpatient Hospital Stay (HOSPITAL_COMMUNITY): Payer: Medicaid Other

## 2021-12-05 ENCOUNTER — Other Ambulatory Visit: Payer: Self-pay

## 2021-12-05 ENCOUNTER — Inpatient Hospital Stay (HOSPITAL_COMMUNITY)
Admission: AD | Admit: 2021-12-05 | Discharge: 2021-12-07 | DRG: 807 | Disposition: A | Discharge status: Home / Self Care | Payer: Medicaid Other | Attending: Obstetrics and Gynecology | Admitting: Obstetrics and Gynecology

## 2021-12-05 ENCOUNTER — Inpatient Hospital Stay (HOSPITAL_COMMUNITY): Payer: Medicaid Other | Admitting: Anesthesiology

## 2021-12-05 DIAGNOSIS — Z20822 Contact with and (suspected) exposure to covid-19: Secondary | ICD-10-CM | POA: Diagnosis present

## 2021-12-05 DIAGNOSIS — O99824 Streptococcus B carrier state complicating childbirth: Secondary | ICD-10-CM | POA: Diagnosis present

## 2021-12-05 DIAGNOSIS — Z3490 Encounter for supervision of normal pregnancy, unspecified, unspecified trimester: Secondary | ICD-10-CM

## 2021-12-05 DIAGNOSIS — O480 Post-term pregnancy: Principal | ICD-10-CM | POA: Diagnosis present

## 2021-12-05 DIAGNOSIS — Z3A40 40 weeks gestation of pregnancy: Secondary | ICD-10-CM

## 2021-12-05 LAB — CBC
HCT: 36.2 % (ref 36.0–46.0)
Hemoglobin: 11.5 g/dL — ABNORMAL LOW (ref 12.0–15.0)
MCH: 26.8 pg (ref 26.0–34.0)
MCHC: 31.8 g/dL (ref 30.0–36.0)
MCV: 84.4 fL (ref 80.0–100.0)
Platelets: 294 10*3/uL (ref 150–400)
RBC: 4.29 MIL/uL (ref 3.87–5.11)
RDW: 14.2 % (ref 11.5–15.5)
WBC: 9.8 10*3/uL (ref 4.0–10.5)
nRBC: 0 % (ref 0.0–0.2)

## 2021-12-05 LAB — TYPE AND SCREEN
ABO/RH(D): A POS
Antibody Screen: NEGATIVE

## 2021-12-05 LAB — RESP PANEL BY RT-PCR (FLU A&B, COVID) ARPGX2
Influenza A by PCR: NEGATIVE
Influenza B by PCR: NEGATIVE
SARS Coronavirus 2 by RT PCR: NEGATIVE

## 2021-12-05 LAB — RPR: RPR Ser Ql: NONREACTIVE

## 2021-12-05 MED ORDER — OXYTOCIN-SODIUM CHLORIDE 30-0.9 UT/500ML-% IV SOLN
1.0000 m[IU]/min | INTRAVENOUS | Status: DC
  Administered 2021-12-05: 2 m[IU]/min via INTRAVENOUS

## 2021-12-05 MED ORDER — DIBUCAINE (PERIANAL) 1 % EX OINT: 1.0000 "application " | TOPICAL_OINTMENT | CUTANEOUS | Status: DC | PRN

## 2021-12-05 MED ORDER — DIPHENHYDRAMINE HCL 50 MG/ML IJ SOLN: 12.5000 mg | INTRAMUSCULAR | Status: DC | PRN

## 2021-12-05 MED ORDER — SODIUM CHLORIDE 0.9 % IV SOLN
5.0000 10*6.[IU] | Freq: Once | INTRAVENOUS | Status: AC
  Administered 2021-12-05: 5 10*6.[IU] via INTRAVENOUS
  Filled 2021-12-05: qty 5

## 2021-12-05 MED ORDER — PRENATAL MULTIVITAMIN CH
1.0000 | ORAL_TABLET | Freq: Every day | ORAL | Status: DC
  Administered 2021-12-06 – 2021-12-07 (×2): 1 via ORAL
  Filled 2021-12-05 (×2): qty 1

## 2021-12-05 MED ORDER — OXYCODONE-ACETAMINOPHEN 5-325 MG PO TABS: 2.0000 | ORAL_TABLET | ORAL | Status: DC | PRN

## 2021-12-05 MED ORDER — PHENYLEPHRINE 40 MCG/ML (10ML) SYRINGE FOR IV PUSH (FOR BLOOD PRESSURE SUPPORT)
80.0000 ug | PREFILLED_SYRINGE | INTRAVENOUS | Status: DC | PRN
  Filled 2021-12-05: qty 10

## 2021-12-05 MED ORDER — COCONUT OIL OIL: 1.0000 "application " | TOPICAL_OIL | Status: DC | PRN

## 2021-12-05 MED ORDER — ONDANSETRON HCL 4 MG/2ML IJ SOLN: 4.0000 mg | Freq: Four times a day (QID) | INTRAMUSCULAR | Status: DC | PRN

## 2021-12-05 MED ORDER — DIPHENHYDRAMINE HCL 25 MG PO CAPS: 25.0000 mg | ORAL_CAPSULE | Freq: Four times a day (QID) | ORAL | Status: DC | PRN

## 2021-12-05 MED ORDER — TERBUTALINE SULFATE 1 MG/ML IJ SOLN
0.2500 mg | Freq: Once | INTRAMUSCULAR | Status: DC | PRN
  Filled 2021-12-05: qty 1

## 2021-12-05 MED ORDER — OXYTOCIN-SODIUM CHLORIDE 30-0.9 UT/500ML-% IV SOLN
2.5000 [IU]/h | INTRAVENOUS | Status: DC
Start: 2021-12-05 — End: 2021-12-05

## 2021-12-05 MED ORDER — BENZOCAINE-MENTHOL 20-0.5 % EX AERO
1.0000 "application " | INHALATION_SPRAY | CUTANEOUS | Status: DC | PRN
  Administered 2021-12-05: 1 via TOPICAL
  Filled 2021-12-05: qty 56

## 2021-12-05 MED ORDER — OXYCODONE HCL 5 MG PO TABS: 5.0000 mg | ORAL_TABLET | ORAL | Status: DC | PRN

## 2021-12-05 MED ORDER — LIDOCAINE HCL (PF) 1 % IJ SOLN
30.0000 mL | INTRAMUSCULAR | Status: DC | PRN
Start: 2021-12-05 — End: 2021-12-05
  Filled 2021-12-05: qty 30

## 2021-12-05 MED ORDER — TETANUS-DIPHTH-ACELL PERTUSSIS 5-2.5-18.5 LF-MCG/0.5 IM SUSY: 0.5000 mL | PREFILLED_SYRINGE | Freq: Once | INTRAMUSCULAR | Status: DC

## 2021-12-05 MED ORDER — OXYTOCIN BOLUS FROM INFUSION
333.0000 mL | Freq: Once | INTRAVENOUS | Status: AC
  Administered 2021-12-05: 333 mL via INTRAVENOUS

## 2021-12-05 MED ORDER — PENICILLIN G POT IN DEXTROSE 60000 UNIT/ML IV SOLN
3.0000 10*6.[IU] | INTRAVENOUS | Status: DC
  Administered 2021-12-05 (×2): 3 10*6.[IU] via INTRAVENOUS
  Filled 2021-12-05 (×7): qty 50

## 2021-12-05 MED ORDER — ZOLPIDEM TARTRATE 5 MG PO TABS: 5.0000 mg | ORAL_TABLET | Freq: Every evening | ORAL | Status: DC | PRN

## 2021-12-05 MED ORDER — ACETAMINOPHEN 325 MG PO TABS
650.0000 mg | ORAL_TABLET | ORAL | Status: DC | PRN
  Administered 2021-12-05: 650 mg via ORAL
  Filled 2021-12-05: qty 2

## 2021-12-05 MED ORDER — SIMETHICONE 80 MG PO CHEW: 80.0000 mg | CHEWABLE_TABLET | ORAL | Status: DC | PRN

## 2021-12-05 MED ORDER — LACTATED RINGERS IV SOLN: INTRAVENOUS | Status: DC

## 2021-12-05 MED ORDER — OXYTOCIN-SODIUM CHLORIDE 30-0.9 UT/500ML-% IV SOLN
1.0000 m[IU]/min | INTRAVENOUS | Status: DC
Start: 2021-12-05 — End: 2021-12-05
  Filled 2021-12-05: qty 500

## 2021-12-05 MED ORDER — FENTANYL-BUPIVACAINE-NACL 0.5-0.125-0.9 MG/250ML-% EP SOLN
12.0000 mL/h | EPIDURAL | Status: DC | PRN
  Administered 2021-12-05: 12 mL/h via EPIDURAL
  Filled 2021-12-05 (×2): qty 250

## 2021-12-05 MED ORDER — LIDOCAINE HCL (PF) 1 % IJ SOLN
INTRAMUSCULAR | Status: DC | PRN
  Administered 2021-12-05 (×2): 4 mL via EPIDURAL

## 2021-12-05 MED ORDER — OXYCODONE-ACETAMINOPHEN 5-325 MG PO TABS: 1.0000 | ORAL_TABLET | ORAL | Status: DC | PRN

## 2021-12-05 MED ORDER — SENNOSIDES-DOCUSATE SODIUM 8.6-50 MG PO TABS
2.0000 | ORAL_TABLET | Freq: Every day | ORAL | Status: DC
  Administered 2021-12-06 – 2021-12-07 (×2): 2 via ORAL
  Filled 2021-12-05 (×2): qty 2

## 2021-12-05 MED ORDER — ONDANSETRON HCL 4 MG PO TABS: 4.0000 mg | ORAL_TABLET | ORAL | Status: DC | PRN

## 2021-12-05 MED ORDER — EPHEDRINE 5 MG/ML INJ
10.0000 mg | INTRAVENOUS | Status: DC | PRN
  Filled 2021-12-05: qty 2

## 2021-12-05 MED ORDER — LACTATED RINGERS IV SOLN: 500.0000 mL | Freq: Once | INTRAVENOUS | Status: DC

## 2021-12-05 MED ORDER — SOD CITRATE-CITRIC ACID 500-334 MG/5ML PO SOLN: 30.0000 mL | ORAL | Status: DC | PRN

## 2021-12-05 MED ORDER — OXYCODONE HCL 5 MG PO TABS: 10.0000 mg | ORAL_TABLET | ORAL | Status: DC | PRN

## 2021-12-05 MED ORDER — LACTATED RINGERS IV SOLN
500.0000 mL | INTRAVENOUS | Status: DC | PRN
Start: 2021-12-05 — End: 2021-12-05
  Administered 2021-12-05: 500 mL via INTRAVENOUS

## 2021-12-05 MED ORDER — IBUPROFEN 600 MG PO TABS
600.0000 mg | ORAL_TABLET | Freq: Four times a day (QID) | ORAL | Status: DC
  Administered 2021-12-05 – 2021-12-07 (×6): 600 mg via ORAL
  Filled 2021-12-05 (×7): qty 1

## 2021-12-05 MED ORDER — MISOPROSTOL 25 MCG QUARTER TABLET
25.0000 ug | ORAL_TABLET | ORAL | Status: DC
  Administered 2021-12-05: 25 ug via VAGINAL

## 2021-12-05 MED ORDER — ACETAMINOPHEN 325 MG PO TABS
650.0000 mg | ORAL_TABLET | ORAL | Status: DC | PRN
  Administered 2021-12-06: 650 mg via ORAL
  Filled 2021-12-05: qty 2

## 2021-12-05 MED ORDER — WITCH HAZEL-GLYCERIN EX PADS: 1.0000 "application " | MEDICATED_PAD | CUTANEOUS | Status: DC | PRN

## 2021-12-05 MED ORDER — ONDANSETRON HCL 4 MG/2ML IJ SOLN: 4.0000 mg | INTRAMUSCULAR | Status: DC | PRN

## 2021-12-05 MED ORDER — MISOPROSTOL 25 MCG QUARTER TABLET
ORAL_TABLET | ORAL | Status: AC
  Filled 2021-12-05: qty 1

## 2021-12-05 NOTE — Anesthesia Preprocedure Evaluation (Signed)
Anesthesia Evaluation  Patient identified by MRN, date of birth, ID band Patient awake    Reviewed: Allergy & Precautions, Patient's Chart, lab work & pertinent test results  History of Anesthesia Complications Negative for: history of anesthetic complications  Airway Mallampati: II  TM Distance: >3 FB Neck ROM: Full    Dental no notable dental hx.    Pulmonary neg pulmonary ROS,    Pulmonary exam normal        Cardiovascular negative cardio ROS Normal cardiovascular exam     Neuro/Psych negative neurological ROS  negative psych ROS   GI/Hepatic negative GI ROS, Neg liver ROS,   Endo/Other  negative endocrine ROS  Renal/GU negative Renal ROS  negative genitourinary   Musculoskeletal negative musculoskeletal ROS (+)   Abdominal   Peds  Hematology  (+) Blood dyscrasia, anemia ,   Anesthesia Other Findings Day of surgery medications reviewed with patient.  Reproductive/Obstetrics (+) Pregnancy                             Anesthesia Physical Anesthesia Plan  ASA: 2  Anesthesia Plan: Epidural   Post-op Pain Management:    Induction:   PONV Risk Score and Plan: Treatment may vary due to age or medical condition  Airway Management Planned: Natural Airway  Additional Equipment: Fetal Monitoring  Intra-op Plan:   Post-operative Plan:   Informed Consent: I have reviewed the patients History and Physical, chart, labs and discussed the procedure including the risks, benefits and alternatives for the proposed anesthesia with the patient or authorized representative who has indicated his/her understanding and acceptance.       Plan Discussed with:   Anesthesia Plan Comments:         Anesthesia Quick Evaluation  

## 2021-12-05 NOTE — Anesthesia Procedure Notes (Signed)
Epidural Patient location during procedure: OB Start time: 12/05/2021 3:21 PM End time: 12/05/2021 3:24 PM  Staffing Anesthesiologist: Brennan Bailey, MD Performed: anesthesiologist   Preanesthetic Checklist Completed: patient identified, IV checked, risks and benefits discussed, monitors and equipment checked, pre-op evaluation and timeout performed  Epidural Patient position: sitting Prep: DuraPrep and site prepped and draped Patient monitoring: continuous pulse ox, blood pressure and heart rate Approach: midline Location: L3-L4 Injection technique: LOR air  Needle:  Needle type: Tuohy  Needle gauge: 17 G Needle length: 9 cm Needle insertion depth: 7 cm Catheter type: closed end flexible Catheter size: 19 Gauge Catheter at skin depth: 12 cm Test dose: negative and Other (1% lidocaine)  Assessment Events: blood not aspirated, injection not painful, no injection resistance, no paresthesia and negative IV test  Additional Notes Patient identified. Risks, benefits, and alternatives discussed with patient including but not limited to bleeding, infection, nerve damage, paralysis, failed block, incomplete pain control, headache, blood pressure changes, nausea, vomiting, reactions to medication, itching, and postpartum back pain. Confirmed with bedside nurse the patient's most recent platelet count. Confirmed with patient that they are not currently taking any anticoagulation, have any bleeding history, or any family history of bleeding disorders. Patient expressed understanding and wished to proceed. All questions were answered. Sterile technique was used throughout the entire procedure. Please see nursing notes for vital signs.   Crisp LOR after one needle redirection. Test dose was given through epidural catheter and negative prior to continuing to dose epidural or start infusion. Warning signs of high block given to the patient including shortness of breath, tingling/numbness in  hands, complete motor block, or any concerning symptoms with instructions to call for help. Patient was given instructions on fall risk and not to get out of bed. All questions and concerns addressed with instructions to call with any issues or inadequate analgesia.  Reason for block:procedure for pain

## 2021-12-05 NOTE — H&P (Signed)
26 y.o. [redacted]w[redacted]d  [id number] comes in for schedule IOL for postdates.  Otherwise has good fetal movement and no bleeding.  Past Medical History:  Diagnosis Date   Anemia    pregnancy   Chlamydia infection 04/03/2021   Gallstones 10/2020   Headache     Past Surgical History:  Procedure Laterality Date   CHOLECYSTECTOMY N/A 04/04/2021   Procedure: LAPAROSCOPIC CHOLECYSTECTOMY;  Surgeon: Ralene Ok, MD;  Location: [facility] OR;  Service: General;  Laterality: N/A;    OB History  Gravida Para Term Preterm AB Living  3 1 1  0 1 1  SAB IAB Ectopic Multiple Live Births  1 0 0 0 1    # Outcome Date GA Lbr Len/2nd Weight Sex Delivery Anes PTL Lv  3 Current           2 SAB 02/19/21 [redacted]w[redacted]d         1 Term 05/06/20 [redacted]w[redacted]d 07:13 / 01:09 3325 g M Vag-Spont EPI  LIV    Social History   Socioeconomic History   Marital status: Significant Other    Spouse name: Not on file   Number of children: Not on file   Years of education: Not on file   Highest education level: Not on file  Occupational History   Not on file  Tobacco Use   Smoking status: Never   Smokeless tobacco: Never  Vaping Use   Vaping Use: Never used  Substance and Sexual Activity   Alcohol use: Not Currently   Drug use: Never   Sexual activity: Yes  Other Topics Concern   Not on file  Social History Narrative   Not on file   Social Determinants of Health   Financial Resource Strain: Not on file  Food Insecurity: Not on file  Transportation Needs: Not on file  Physical Activity: Not on file  Stress: Not on file  Social Connections: Not on file  Intimate Partner Violence: Not on file   Shrimp [shellfish allergy]    Prenatal Transfer Tool  Maternal Diabetes: No Genetic Screening: Normal Maternal Ultrasounds/Referrals: Normal Fetal Ultrasounds or other Referrals:  None Maternal Substance Abuse:  No Significant Maternal Medications:  None Significant Maternal Lab Results: Group B Strep positive  Other PNC:  uncomplicated.    Vitals:   12/05/21 0803  BP: 123/82  Pulse: (!) 101  Resp: 18  Temp: 98.4 F (36.9 C)  TempSrc: Oral  Weight: 104.8 kg  Height: 5\' 4"  (1.626 m)   Per recent office exam: Lungs/Cor:  NAD Abdomen:  soft, gravid Ex:  no cords, erythema SVE:  2/70/-3 Per L&D exam FHTs:  130, good STV, NST R; f/b recent 2min decel Toco:  q q8 1.5/50/-3   A/P   Admit for IOL Will plan cytotec for cervical ripening, anticipate AROM pitocin Epidural when desired.  GBS +, will administer PCN  Allyn Kenner

## 2021-12-06 LAB — CBC
HCT: 30.3 % — ABNORMAL LOW (ref 36.0–46.0)
Hemoglobin: 10.1 g/dL — ABNORMAL LOW (ref 12.0–15.0)
MCH: 27.3 pg (ref 26.0–34.0)
MCHC: 33.3 g/dL (ref 30.0–36.0)
MCV: 81.9 fL (ref 80.0–100.0)
Platelets: 257 10*3/uL (ref 150–400)
RBC: 3.7 MIL/uL — ABNORMAL LOW (ref 3.87–5.11)
RDW: 14.2 % (ref 11.5–15.5)
WBC: 11.3 10*3/uL — ABNORMAL HIGH (ref 4.0–10.5)
nRBC: 0 % (ref 0.0–0.2)

## 2021-12-06 NOTE — Progress Notes (Signed)
POSTPARTUM PROGRESS NOTE  Post Partum Day #1  Subjective:  No acute events overnight.  Pt denies problems with ambulating, voiding or po intake.  She denies nausea or vomiting.  Pain is well controlled. Lochia Minimal.   Objective: Blood pressure 109/71, pulse 66, temperature 98.7 F (37.1 C), temperature source Oral, resp. rate 18, height 5\' 4"  (1.626 m), weight 104.8 kg, last menstrual period 12/08/2020, SpO2 100 %, unknown if currently breastfeeding.  Physical Exam:  General: alert, cooperative and no distress Lochia:normal flow Chest: CTAB Heart: RRR no m/r/g Abdomen: +BS, soft, nontender Uterine Fundus: firm, 2cm below umbilicus Extremities: neg edema, neg calf TTP BL, neg Homans BL  Recent Labs    12/05/21 0818 12/06/21 0440  HGB 11.5* 10.1*  HCT 36.2 30.3*    Assessment/Plan:  ASSESSMENT: Kayla [name] is a 26 y.o. [id number] s/p SVD @ [redacted]w[redacted]d. PNC c/b GBS pos.   Plan for discharge tomorrow and Breastfeeding   LOS: 1 day

## 2021-12-06 NOTE — Lactation Note (Signed)
This note was copied from a baby's chart. Lactation Consultation Note  Patient Name: Kayla [name] ERXVQ'M Date: 12/06/2021 Reason for consult: Follow-up assessment;Mother's request;Term;Breastfeeding assistance Age:26 years  Mom feeding plan to pump and offer EBM in a bottle. LC set up dEBP and assessed flange size 24 as comfortable fit. Mom aware flange sizes can change with pumping to adjust accordingly.   Infant last feeding at 8:30 am prior to [facility] arrival.   Plan 1. To feed based on cues 8-12x 24hr period.  2. Mom to offer EBM first followed by formula with regular nipple. Formula supplementation guide provided. Parents now aware to wake infant for a feeding if more than 3 hrs not fed.   3. DEBP q 3hrs for 15 min   All questions answered at the end of the visit.   Maternal Data    Feeding Mother's Current Feeding Choice: Breast Milk and Formula Nipple Type: Regular  LATCH Score                    Lactation Tools Discussed/Used Tools: Pump;Flanges Flange Size: 24 Breast pump type: Double-Electric Breast Pump Pump Education: Setup, frequency, and cleaning;Milk Storage Reason for Pumping: increase stimulation Pumping frequency: every 3 hrs for 15 min  Interventions Interventions: Breast feeding basics reviewed;Expressed milk;DEBP;Education;Infant Driven Feeding Algorithm education;Coconut oil  Discharge    Consult Status Consult Status: Follow-up Date: 12/07/21 Follow-up type: In-patient    Kayla Garmon  [name] 12/06/2021, 2:45 PM

## 2021-12-06 NOTE — Lactation Note (Signed)
This note was copied from a baby's chart. Lactation Consultation Note  Patient Name: Kayla [name] Date YHCWC'B Date: 12/06/2021 Reason for consult: Initial assessment;Term Age:25 hours  LC in to visit with P2 Mom of term baby.  Baby has been formula fed by bottle X 3 since birth.    Mom states she needs to breastfeed baby, but is planning to do both breast and bottle.  She stated that she is formula feeding because she doesn't have anything.  Demonstrated breast massage and hand expression with Mom's permission.  Small dot of colostrum expressed.    Baby dressed in outfit.  Talked about the benefits of STS with regards to breastfeeding.  Offered to help with positioning and latching.   FOB started a formula feeding by bottle and baby was vigorously sucking.  Reminded Mom that baby wouldn't be getting such a fast flow with her colostrum.  Encouraged Mom to latch baby to the breast first.  Encouraged Mom to ask for help with breastfeeding.  Mom aware of IP and OP lactation support.  Maternal Data Has patient been taught Hand Expression?: Yes Does the patient have breastfeeding experience prior to this delivery?: Yes How long did the patient breastfeed?: 4 months  Feeding Mother's Current Feeding Choice: Breast Milk and Formula   Lactation Tools Discussed/Used Tools: Bottle  Interventions Interventions: Breast feeding basics reviewed;Skin to skin;Breast massage;Hand express  Consult Status Consult Status: Follow-up Date: 12/07/21 Follow-up type: In-patient    Judee Clara 12/06/2021, 8:59 AM

## 2021-12-06 NOTE — Anesthesia Postprocedure Evaluation (Signed)
Anesthesia Post Note  Patient: Kayla [name]  Procedure(s) Performed: AN AD HOC LABOR EPIDURAL     Patient location during evaluation: Mother Baby Anesthesia Type: Epidural Level of consciousness: awake Pain management: satisfactory to patient Vital Signs Assessment: post-procedure vital signs reviewed and stable Respiratory status: spontaneous breathing Cardiovascular status: stable Anesthetic complications: no   No notable events documented.  Last Vitals:  Vitals:   12/06/21 0226 12/06/21 0627  BP: 117/80 109/71  Pulse: 78 66  Resp: 18 18  Temp: 37 C 37.1 C  SpO2: 98% 100%    Last Pain:  Vitals:   12/06/21 0627  TempSrc: Oral  PainSc:    Pain Goal:                   Cephus Shelling

## 2021-12-07 MED ORDER — IBUPROFEN 600 MG PO TABS: 600.0000 mg | ORAL_TABLET | Freq: Four times a day (QID) | ORAL | 1 refills | Status: DC | PRN

## 2021-12-07 NOTE — Progress Notes (Signed)
Post Partum Day 2 Subjective: no complaints, up ad lib, voiding, tolerating PO, + flatus, and pain better controlled. Reports lochia mild. She is breast/bottlefeeding. She denies HA, CP or SOB. She is ready for discharge to home today   Objective: Blood pressure 122/78, pulse 72, temperature 98.6 F (37 C), temperature source Oral, resp. rate 19, height 5\' 4"  (1.626 m), weight 104.8 kg, last menstrual period 12/08/2020, SpO2 98 %, unknown if currently breastfeeding.  Physical Exam:  General: alert, cooperative, and no distress Lochia: appropriate Uterine Fundus: firm Incision: n/a DVT Evaluation: No evidence of DVT seen on physical exam.  Recent Labs    12/05/21 0818 12/06/21 0440  HGB 11.5* 10.1*  HCT 36.2 30.3*    Assessment/Plan: Discharge home Breat/bottlefeeding Instructions reviewed; pp visit in 6 weeks   LOS: 2 days   Kolson Chovanec W Menashe Kafer 12/07/2021, 10:57 AM

## 2021-12-07 NOTE — Lactation Note (Signed)
This note was copied from a baby's chart. Lactation Consultation Note  Patient Name: Kayla [name] [id number] Date: 12/07/2021 Reason for consult: Follow-up assessment Age:25 hours  Maternal Data  LC visited baby. Mom holding baby. Baby sleeping.   LC educated Mom and Dad about breastfeeding and feeding choice  Mom stated a desire to use formula and when asked if baby latched or if she wanted baby to latch to feed from breast she said she "did not want to".  When asked about previous breastfeeding experience, Mom reported good supply after her milk came to volume.   LC educated Mom about the benefits of breast milk and breastfeeding for both mother and baby.   LC inquired about pump and was shown personal pump- Mom's preference. She liked her personal pump better.   Mom stated she had WIC and mentioned support she heard that was available.   Feeding  Bottle- Formula  LATCH Score                    Lactation Tools Discussed/Used    Interventions Interventions: Education  Discharge Pump: Personal WIC Program: Yes  Consult Status Consult Status: Complete    Audelia Acton 12/07/2021, 12:17 PM

## 2021-12-07 NOTE — Discharge Summary (Signed)
Postpartum Discharge Summary  Date of Service updated      Patient Name: Kayla [name] DOB: 1996-05-06 MRN: [id number]  Date of admission: 12/05/2021 Delivery date:12/05/2021  Delivering provider: Philip Aspen  Date of discharge: 12/07/2021  Admitting diagnosis: Pregnant and not yet delivered [Z34.90] Intrauterine pregnancy: [redacted]w[redacted]d     Secondary diagnosis:  Principal Problem:   Pregnant and not yet delivered  Additional problems: none    Discharge diagnosis: Term Pregnancy Delivered                                              Post partum procedures: n/a Augmentation: Cytotec Complications: None  Hospital course: Induction of Labor With Vaginal Delivery   26 y.o. yo [id number] at [redacted]w[redacted]d was admitted to the hospital 12/05/2021 for induction of labor.  Indication for induction: Postdates.  Patient had an uncomplicated labor course as follows: Membrane Rupture Time/Date: 6:17 PM ,12/05/2021   Delivery Method:Vaginal, Spontaneous  Episiotomy: None  Lacerations:  None  Details of delivery can be found in separate delivery note.  Patient had a routine postpartum course. Patient is discharged home 12/07/21.  Newborn Data: Birth date:12/05/2021  Birth time:6:48 PM  Gender:Female  Living status:Living  Apgars:8 ,9  Weight:3800 g   Magnesium Sulfate received: No BMZ received: No  Physical exam  Vitals:   12/06/21 1030 12/06/21 1413 12/06/21 2210 12/07/21 0500  BP: (!) 113/58 (!) 106/58 120/70 122/78  Pulse: 85 80 63 72  Resp:  17 18 19   Temp: 98.3 F (36.8 C) 98.9 F (37.2 C) 98.6 F (37 C)   TempSrc: Oral Oral Oral   SpO2: 99% 99%  98%  Weight:      Height:       General: alert, cooperative, and no distress Lochia: appropriate Uterine Fundus: firm Incision: N/A DVT Evaluation: No evidence of DVT seen on physical exam. Labs: Lab Results  Component Value Date   WBC 11.3 (H) 12/06/2021   HGB 10.1 (L) 12/06/2021   HCT 30.3 (L) 12/06/2021   MCV 81.9 12/06/2021    PLT 257 12/06/2021   CMP Latest Ref Rng & Units 04/04/2021  Glucose 70 - 99 mg/dL 78  BUN 6 - 20 mg/dL 7  Creatinine 06/04/2021 - 5.17 mg/dL 0.01  Sodium 7.49 - 449 mmol/L 136  Potassium 3.5 - 5.1 mmol/L 3.5  Chloride 98 - 111 mmol/L 104  CO2 22 - 32 mmol/L 24  Calcium 8.9 - 10.3 mg/dL 675)  Total Protein 6.5 - 8.1 g/dL 6.6  Total Bilirubin 0.3 - 1.2 mg/dL 0.9  Alkaline Phos 38 - 126 U/L 51  AST 15 - 41 U/L 27  ALT 0 - 44 U/L 41   Edinburgh Score: Edinburgh Postnatal Depression Scale Screening Tool 12/06/2021  I have been able to laugh and see the funny side of things. 0  I have looked forward with enjoyment to things. 0  I have blamed myself unnecessarily when things went wrong. 0  I have been anxious or worried for no good reason. 0  I have felt scared or panicky for no good reason. 0  Things have been getting on top of me. 1  I have been so unhappy that I have had difficulty sleeping. 1  I have felt sad or miserable. 0  I have been so unhappy that I have been crying. 0  The  thought of harming myself has occurred to me. 0  Edinburgh Postnatal Depression Scale Total 2      After visit meds:  Allergies as of 12/07/2021       Reactions   Shrimp [shellfish Allergy] Nausea And Vomiting, Rash   PT states she also gets a fever         Medication List     STOP taking these medications    erythromycin ophthalmic ointment   metroNIDAZOLE 0.75 % vaginal gel Commonly known as: METROGEL       TAKE these medications    ibuprofen 600 MG tablet Commonly known as: ADVIL Take 1 tablet (600 mg total) by mouth every 6 (six) hours as needed for moderate pain or cramping.   multivitamin-prenatal 27-0.8 MG Tabs tablet Take 1 tablet by mouth daily at 12 noon.         Discharge home in stable condition Infant Feeding: Bottle and Breast Infant Disposition:home with mother Discharge instruction: per After Visit Summary and Postpartum booklet. Activity: Advance as tolerated.  Pelvic rest for 6 weeks.  Diet: routine diet Anticipated Birth Control: Unsure Postpartum Appointment:6 weeks Additional Postpartum F/U: Postpartum Depression checkup Future Appointments:No future appointments. Follow up Visit:  Follow-up Information     Ob/Gyn, [facility]. Schedule an appointment as soon as possible for a visit in 6 week(s).   Why: For postpartum visit Contact information: [address] [place] [address] [phone]                     12/07/2021 Cathrine Muster, DO

## 2021-12-07 NOTE — Discharge Instructions (Signed)
Call office with any concerns [phone] 

## 2021-12-15 ENCOUNTER — Telehealth (HOSPITAL_COMMUNITY): Payer: Self-pay | Admitting: *Deleted

## 2021-12-15 NOTE — Telephone Encounter (Signed)
Mom reports feeling good. No concerns about herself at this time. EPDS=2) Hospital score=3) Mom reports baby is doing well. Feeding, peeing, and pooping without difficulty. Safe sleep reviewed. Mom reports no concerns about baby at present.  Odis Hollingshead, RN 12-15-2021 at 11:39am

## 2022-01-06 ENCOUNTER — Encounter (HOSPITAL_COMMUNITY): Payer: Self-pay | Admitting: Obstetrics and Gynecology

## 2022-01-06 ENCOUNTER — Inpatient Hospital Stay (HOSPITAL_COMMUNITY)
Admission: AD | Admit: 2022-01-06 | Discharge: 2022-01-06 | Disposition: A | Discharge status: Home / Self Care | Payer: Medicaid Other | Attending: Obstetrics and Gynecology | Admitting: Obstetrics and Gynecology

## 2022-01-06 ENCOUNTER — Inpatient Hospital Stay (HOSPITAL_COMMUNITY): Payer: Medicaid Other

## 2022-01-06 DIAGNOSIS — N939 Abnormal uterine and vaginal bleeding, unspecified: Secondary | ICD-10-CM

## 2022-01-06 LAB — CBC WITH DIFFERENTIAL/PLATELET
Abs Immature Granulocytes: 0.03 10*3/uL (ref 0.00–0.07)
Basophils Absolute: 0 10*3/uL (ref 0.0–0.1)
Basophils Relative: 0 %
Eosinophils Absolute: 0.6 10*3/uL — ABNORMAL HIGH (ref 0.0–0.5)
Eosinophils Relative: 6 %
HCT: 43.6 % (ref 36.0–46.0)
Hemoglobin: 14.2 g/dL (ref 12.0–15.0)
Immature Granulocytes: 0 %
Lymphocytes Relative: 33 %
Lymphs Abs: 3.3 10*3/uL (ref 0.7–4.0)
MCH: 27.1 pg (ref 26.0–34.0)
MCHC: 32.6 g/dL (ref 30.0–36.0)
MCV: 83.2 fL (ref 80.0–100.0)
Monocytes Absolute: 0.6 10*3/uL (ref 0.1–1.0)
Monocytes Relative: 6 %
Neutro Abs: 5.5 10*3/uL (ref 1.7–7.7)
Neutrophils Relative %: 55 %
Platelets: 297 10*3/uL (ref 150–400)
RBC: 5.24 MIL/uL — ABNORMAL HIGH (ref 3.87–5.11)
RDW: 15.6 % — ABNORMAL HIGH (ref 11.5–15.5)
WBC: 10 10*3/uL (ref 4.0–10.5)
nRBC: 0 % (ref 0.0–0.2)

## 2022-01-06 MED ORDER — NORGESTIMATE-ETH ESTRADIOL 0.25-35 MG-MCG PO TABS
1.0000 | ORAL_TABLET | Freq: Every day | ORAL | 11 refills | Status: DC
Start: 2022-01-06 — End: 2022-12-13

## 2022-01-06 NOTE — MAU Note (Signed)
..  Kayla [name] is a 26 y.o. at [redacted]w[redacted]d here in MAU reporting: 4 weeks PP VAG DEL. Pt reports lochia stopped around 2 weeks. She states she had sex with husband earlier this week, pt took a plan B shortly after, VB started 2 days ago, and this am around 0700 pt reports having heavy bright red VB and blood clots. Pt states she had to use her baby's diapers due to no pads, and changed 5 times. She reports having a slight HA and cramping.    ? ?Onset of complaint: 0700 ?Pain score: 2/10 HA, cramping 3/10 ?Vitals:  ? 01/06/22 1956  ?BP: 121/82  ?Pulse: 100  ?Resp: 18  ?Temp: 97.9 ?F (36.6 ?C)  ?SpO2: 100%  ?   ? ?Lab orders placed from triage:  none ? ?

## 2022-01-06 NOTE — MAU Provider Note (Signed)
Chief Complaint: Vaginal Bleeding   Event Date/Time   First Provider Initiated Contact with Patient 01/06/22 2044     SUBJECTIVE HPI: Kayla [name] is a 26 y.o. [id number] at [redacted]w[redacted]d postpartum uncomplicated vaginal delivery who presents to [facility] Admissions reporting heavy vaginal bleeding and passing clots since yesterday. Had light lochia for two weeks after delivery. Resumed intercourse earlier this week. Took Plan B. Not on birth control. Formula feeding.    Associated signs and symptoms: Mild low abd camping C/W her usual period cramps. Hasn't taken anything for the pain. Denies fever, chills, dizziness, urinary complains, Gi complaints. Unsure if she has passed tissue.   Past Medical History:  Diagnosis Date   Anemia    pregnancy   Chlamydia infection 04/03/2021   Gallstones 10/2020   Headache    OB History  Gravida Para Term Preterm AB Living  3 2 2  0 1 2  SAB IAB Ectopic Multiple Live Births  1 0 0 0 2    # Outcome Date GA Lbr Len/2nd Weight Sex Delivery Anes PTL Lv  3 Term 12/05/21 [redacted]w[redacted]d 03:14 / 00:04 3800 g F Vag-Spont EPI  LIV  2 SAB 02/19/21 [redacted]w[redacted]d         1 Term 05/06/20 [redacted]w[redacted]d 07:13 / 01:09 3325 g M Vag-Spont EPI  LIV   Past Surgical History:  Procedure Laterality Date   CHOLECYSTECTOMY N/A 04/04/2021   Procedure: LAPAROSCOPIC CHOLECYSTECTOMY;  Surgeon: 06/04/2021, MD;  Location: [facility] OR;  Service: General;  Laterality: N/A;   Social History   Socioeconomic History   Marital status: Significant Other    Spouse name: Not on file   Number of children: Not on file   Years of education: Not on file   Highest education level: Not on file  Occupational History   Not on file  Tobacco Use   Smoking status: Never   Smokeless tobacco: Never  Vaping Use   Vaping Use: Never used  Substance and Sexual Activity   Alcohol use: Not Currently   Drug use: Never   Sexual activity: Yes  Other Topics Concern   Not on file  Social History Narrative   Not on file    Social Determinants of Health   Financial Resource Strain: Not on file  Food Insecurity: Not on file  Transportation Needs: Not on file  Physical Activity: Not on file  Stress: Not on file  Social Connections: Not on file  Intimate Partner Violence: Not on file   Family History  Problem Relation Age of Onset   Hypertension Mother    Other Mother        lymphodema   Hypertension Maternal Grandmother    Stroke Maternal Grandmother    Hypertension Maternal Grandfather    Other Maternal Grandfather        blood clotting disorder   No current facility-administered medications on file prior to encounter.   Current Outpatient Medications on File Prior to Encounter  Medication Sig Dispense Refill   ibuprofen (ADVIL) 600 MG tablet Take 1 tablet (600 mg total) by mouth every 6 (six) hours as needed for moderate pain or cramping. 40 tablet 1   Prenatal Vit-Fe Fumarate-FA (MULTIVITAMIN-PRENATAL) 27-0.8 MG TABS tablet Take 1 tablet by mouth daily at 12 noon.     [DISCONTINUED] promethazine (PHENERGAN) 25 MG tablet Take 1 tablet (25 mg total) by mouth every 6 (six) hours as needed for nausea or vomiting. (Patient not taking: No sig reported) 30 tablet 2   Allergies  Allergen Reactions   Shrimp [Shellfish Allergy] Nausea And Vomiting and Rash    PT states she also gets a fever     I have reviewed patient's Past Medical Hx, Surgical Hx, Family Hx, Social Hx, medications and allergies.   Review of Systems  Constitutional:  Negative for chills and fever.  Gastrointestinal:  Positive for abdominal pain (mild low abd cramping).  Genitourinary:  Positive for vaginal bleeding. Negative for dysuria and vaginal discharge.  Skin:  Negative for pallor.  Neurological:  Negative for dizziness and light-headedness.   OBJECTIVE Patient Vitals for the past 24 hrs:  BP Temp Temp src Pulse Resp SpO2 Height Weight  01/06/22 2324 121/85 -- -- 73 18 -- -- --  01/06/22 2113 126/77 -- -- (!) 108 -- --  -- --  01/06/22 2109 115/84 -- -- (!) 104 -- -- -- --  01/06/22 2107 119/79 -- -- 86 -- -- -- --  01/06/22 2106 122/67 -- -- 68 -- -- -- --  01/06/22 1956 121/82 97.9 F (36.6 C) Oral 100 18 100 % 5\' 4"  (1.626 m) 90.6 kg   Constitutional: Well-developed, well-nourished female in no acute distress.  Cardiovascular: normal rate. Mild tachycardia w/ standing.  Respiratory: normal rate and effort.  GI: Abd soft, non-tender, Uterus non-palpable abdominally.  MS: Extremities nontender, no edema, normal ROM Neurologic: Alert and oriented x 4.  GU:  SPECULUM EXAM: NEFG, physiologic discharge, moderate amount of bright red blood noted, no clots or tissue. Cervix clean, closed. No polyps seen.    BIMANUAL: cervix closed; uterus normal size, no adnexal tenderness or masses. No CMT.  LAB RESULTS Results for orders placed or performed during the hospital encounter of 01/06/22 (from the past 24 hour(s))  CBC with Differential/Platelet     Status: Abnormal   Collection Time: 01/06/22  8:24 PM  Result Value Ref Range   WBC 10.0 4.0 - 10.5 K/uL   RBC 5.24 (H) 3.87 - 5.11 MIL/uL   Hemoglobin 14.2 12.0 - 15.0 g/dL   HCT 03/08/22 47.0 - 96.2 %   MCV 83.2 80.0 - 100.0 fL   MCH 27.1 26.0 - 34.0 pg   MCHC 32.6 30.0 - 36.0 g/dL   RDW 83.6 (H) 62.9 - 47.6 %   Platelets 297 150 - 400 K/uL   nRBC 0.0 0.0 - 0.2 %   Neutrophils Relative % 55 %   Neutro Abs 5.5 1.7 - 7.7 K/uL   Lymphocytes Relative 33 %   Lymphs Abs 3.3 0.7 - 4.0 K/uL   Monocytes Relative 6 %   Monocytes Absolute 0.6 0.1 - 1.0 K/uL   Eosinophils Relative 6 %   Eosinophils Absolute 0.6 (H) 0.0 - 0.5 K/uL   Basophils Relative 0 %   Basophils Absolute 0.0 0.0 - 0.1 K/uL   Immature Granulocytes 0 %   Abs Immature Granulocytes 0.03 0.00 - 0.07 K/uL    IMAGING 54.6 PELVIS (TRANSABDOMINAL ONLY)  Result Date: 01/06/2022 CLINICAL DATA:  Postpartum vaginal bleeding after delivery 5 weeks ago. EXAM: TRANSABDOMINAL ULTRASOUND OF PELVIS TECHNIQUE:  Transabdominal ultrasound examination of the pelvis was performed including evaluation of the uterus, ovaries, adnexal regions, and pelvic cul-de-sac. COMPARISON:  04/03/2021 FINDINGS: Uterus Measurements: 9.2 x 5.7 x 7 cm = volume: 191 mL. No fibroids or other mass visualized. Endometrium Thickness: 8 mm.  No focal abnormality visualized. Right ovary Measurements: 3.5 x 2 x 2.6 cm = volume: 9 mL. Normal appearance/no adnexal mass. Left ovary Measurements: 3.5 x 2.2 x 2.5  cm = volume: 10 mL. Normal appearance/no adnexal mass. Other findings: No abnormal free fluid. Flow is demonstrated within both ovaries using limited color flow Doppler imaging. IMPRESSION: Normal ultrasound appearance of the uterus and ovaries. No abnormal endometrial thickening or contents. Electronically Signed   By: Burman NievesWilliam  Stevens M.D.   On: 01/06/2022 21:52    MAU COURSE Orders Placed This Encounter  Procedures   US PELVIS (TRANSABDOMINAL ONLY)   CBC with Differential/Platelet   Orthostatic vital signs   Discharge patient   Meds ordered this encounter  Medications   norgestimate-ethinyl estradiol (ORTHO-CYCLEN) 0.25-35 MG-MCG tablet    Sig: Take 1 tablet by mouth daily.    Dispense:  28 tablet    Refill:  11    Order Specific Question:   Supervising Provider    Answer:   Nettie ElmERVIN, MICHAEL L [[id number]]    MDM - Reported new-onset heavy vaginal bleeding at home that has spontaneously decreased to Moderate. Lochia lasted two weeks after delivery. Two weeks of not bleeding. Hgb 14 and VSS. US shows 8 mm endometrial stripe essentially ruling out retained POCs. Intercourse a few days ago. Bleeding C/W resumption of menses. Discussed Hx, labs, exam w/ Dr. Alysia PennaErvin. Agrees w/ POC. Does not think Hcg is indicated. Will start OCP for Tx of moderate vaginal bleeding.    ASSESSMENT 1. Abnormal uterine bleeding (AUB)   2. Postpartum hemorrhage, unspecified type     PLAN Discharge home in stable condition. Bleeding precautions   Follow-up Information     [facility]   Why: As needed if symptoms worsen Contact information: [address] [place] [address] [phone]         [facility] Follow up.   Specialty: Obstetrics and Gynecology Why: As needed in emergencies, As needed if symptoms worsen Contact information: [address] [id number] [place] [phone]               Allergies as of 01/06/2022       Reactions   Shrimp [shellfish Allergy] Nausea And Vomiting, Rash   PT states she also gets a fever         Medication List     TAKE these medications    ibuprofen 600 MG tablet Commonly known as: ADVIL Take 1 tablet (600 mg total) by mouth every 6 (six) hours as needed for moderate pain or cramping.   multivitamin-prenatal 27-0.8 MG Tabs tablet Take 1 tablet by mouth daily at 12 noon.   norgestimate-ethinyl estradiol 0.25-35 MG-MCG tablet Commonly known as: ORTHO-CYCLEN Take 1 tablet by mouth daily.         Katrinka BlazingSmith, [place], [place] 01/06/2022  11:42 PM

## 2022-01-15 ENCOUNTER — Other Ambulatory Visit: Payer: Self-pay

## 2022-01-15 ENCOUNTER — Ambulatory Visit
Admission: EM | Admit: 2022-01-15 | Discharge: 2022-01-15 | Disposition: A | Discharge status: Home / Self Care | Payer: Medicaid Other | Attending: Physician Assistant | Admitting: Physician Assistant

## 2022-01-15 DIAGNOSIS — J069 Acute upper respiratory infection, unspecified: Secondary | ICD-10-CM | POA: Insufficient documentation

## 2022-01-15 LAB — POCT RAPID STREP A (OFFICE): Rapid Strep A Screen: NEGATIVE

## 2022-01-15 NOTE — ED Provider Notes (Signed)
?[facility] ? ? ? ?CSN: [id number] ?Arrival date & time: 01/15/22  1358 ? ? ?  ? ?History   ?Chief Complaint ?Chief Complaint  ?Patient presents with  ? Sore Throat  ? ? ?HPI ?Kayla [name] is a 26 y.o. female.  ? ?Patient is here today for evaluation of sore throat, cough, congestion, earache and headache that has been ongoing for the last 2 days. She reports that she thinks she could have strep. She has felt feverish at times. She does not report vomiting or diarrhea. She has tried some OTC meds with mild relief.  ? ?The history is provided by the patient.  ?Sore Throat ?Pertinent negatives include no abdominal pain and no shortness of breath.  ? ?Past Medical History:  ?Diagnosis Date  ? Anemia   ? pregnancy  ? Chlamydia infection 04/03/2021  ? Gallstones 10/2020  ? Headache   ? ? ?Patient Active Problem List  ? Diagnosis Date Noted  ? Pregnant and not yet delivered 12/05/2021  ? Chlamydia infection affecting pregnancy in first trimester 04/05/2021  ? Cholecystitis 04/03/2021  ? Post-dates pregnancy 05/06/2020  ? ? ?Past Surgical History:  ?Procedure Laterality Date  ? CHOLECYSTECTOMY N/A 04/04/2021  ? Procedure: LAPAROSCOPIC CHOLECYSTECTOMY;  Surgeon: Axel Filleramirez, Armando, MD;  [facility];  Service: General;  Laterality: N/A;  ? ? ?OB History   ? ? Gravida  ?3  ? Para  ?2  ? Term  ?2  ? Preterm  ?0  ? AB  ?1  ? Living  ?2  ?  ? ? SAB  ?1  ? IAB  ?0  ? Ectopic  ?0  ? Multiple  ?0  ? Live Births  ?2  ?   ?  ?  ? ? ? ?Home Medications   ? ?Prior to Admission medications   ?Medication Sig Start Date End Date Taking? Authorizing Provider  ?ibuprofen (ADVIL) 600 MG tablet Take 1 tablet (600 mg total) by mouth every 6 (six) hours as needed for moderate pain or cramping. 12/07/21   Edwinna AreolaBanga, Cecilia Worema, DO  ?norgestimate-ethinyl estradiol (ORTHO-CYCLEN) 0.25-35 MG-MCG tablet Take 1 tablet by mouth daily. 01/06/22   Katrinka BlazingSmith, [place], CNM  ?Prenatal Vit-Fe Fumarate-FA (MULTIVITAMIN-PRENATAL) 27-0.8 MG  TABS tablet Take 1 tablet by mouth daily at 12 noon.    [provider]  ?promethazine (PHENERGAN) 25 MG tablet Take 1 tablet (25 mg total) by mouth every 6 (six) hours as needed for nausea or vomiting. ?Patient not taking: No sig reported 01/13/21 03/25/21  Aviva SignsWilliams, Marie L, CNM  ? ? ?Family History ?Family History  ?Problem Relation Age of Onset  ? Hypertension Mother   ? Other Mother   ?     lymphodema  ? Hypertension Maternal Grandmother   ? Stroke Maternal Grandmother   ? Hypertension Maternal Grandfather   ? Other Maternal Grandfather   ?     blood clotting disorder  ? ? ?Social History ?Social History  ? ?Tobacco Use  ? Smoking status: Never  ? Smokeless tobacco: Never  ?Vaping Use  ? Vaping Use: Never used  ?Substance Use Topics  ? Alcohol use: Not Currently  ? Drug use: Never  ? ? ? ?Allergies   ?Shrimp [shellfish allergy] ? ? ?Review of Systems ?Review of Systems  ?Constitutional:  Positive for fever.  ?HENT:  Positive for congestion, ear pain and sore throat.   ?Eyes:  Negative for discharge and redness.  ?Respiratory:  Positive for cough. Negative for shortness of  breath and wheezing.   ?Gastrointestinal:  Negative for abdominal pain, diarrhea, nausea and vomiting.  ? ? ?Physical Exam ?Triage Vital Signs ?ED Triage Vitals  ?Enc Vitals Group  ?   BP 01/15/22 1406 110/81  ?   Pulse Rate 01/15/22 1406 81  ?   Resp 01/15/22 1406 18  ?   Temp 01/15/22 1406 98.2 ?F (36.8 ?C)  ?   Temp Source 01/15/22 1406 Oral  ?   SpO2 01/15/22 1406 98 %  ?   Weight --   ?   Height --   ?   Head Circumference --   ?   Peak Flow --   ?   Pain Score 01/15/22 1407 0  ?   Pain Loc --   ?   Pain Edu? --   ?   Excl. in GC? --   ? ?No data found. ? ?Updated Vital Signs ?BP 110/81 (BP Location: Left Arm)   Pulse 81   Temp 98.2 ?F (36.8 ?C) (Oral)   Resp 18   LMP 12/08/2020   SpO2 98%   Breastfeeding No  ?   ? ?Physical Exam ?Vitals and nursing note reviewed.  ?Constitutional:   ?   General: She is not in acute  distress. ?   Appearance: Normal appearance. She is not ill-appearing.  ?HENT:  ?   Head: Normocephalic and atraumatic.  ?   Nose: Congestion present.  ?   Mouth/Throat:  ?   Mouth: Mucous membranes are moist.  ?   Pharynx: Posterior oropharyngeal erythema present. No oropharyngeal exudate.  ?Eyes:  ?   Conjunctiva/sclera: Conjunctivae normal.  ?Cardiovascular:  ?   Rate and Rhythm: Normal rate and regular rhythm.  ?   Heart sounds: Normal heart sounds. No murmur heard. ?Pulmonary:  ?   Effort: Pulmonary effort is normal. No respiratory distress.  ?   Breath sounds: Normal breath sounds. No wheezing, rhonchi or rales.  ?Skin: ?   General: Skin is warm and dry.  ?Neurological:  ?   Mental Status: She is alert.  ?Psychiatric:     ?   Mood and Affect: Mood normal.     ?   Thought Content: Thought content normal.  ? ? ? ?UC Treatments / Results  ?Labs ?(all labs ordered are listed, but only abnormal results are displayed) ?Labs Reviewed  ?COVID-19, FLU A+B NAA  ?CULTURE, GROUP A STREP [facility])  ?POCT RAPID STREP A (OFFICE)  ? ? ?EKG ? ? ?Radiology ?No results found. ? ?Procedures ?Procedures (including critical care time) ? ?Medications Ordered in UC ?Medications - No data to display ? ?Initial Impression / Assessment and Plan / UC Course  ?I have reviewed the triage vital signs and the nursing notes. ? ?Pertinent labs & imaging results that were available during my care of the patient were reviewed by me and considered in my medical decision making (see chart for details). ? ?  ?Rapid strep test negative in office. Will send culture but also screen for covid and flu. Recommended symptomatic treatment, increased fluids and rest. Encourage follow up with any further concerns.  ? ?Final Clinical Impressions(s) / UC Diagnoses  ? ?Final diagnoses:  ?Acute upper respiratory infection  ? ? ? ?Discharge Instructions   ? ?  ? ?Strep screening negative. Suspect viral cause of symptoms. Continue symptomatic treatment, increased  fluids and rest.  ? ? ? ? ? ? ?ED Prescriptions   ?None ?  ? ?PDMP not reviewed this encounter. ?  ?  Tomi Bamberger, PA-C ?01/15/22 1512 ? ?

## 2022-01-15 NOTE — ED Triage Notes (Signed)
Pt c/o cough, sore throat, nasal congestion, earaches, headaches,  ? ?Onset ~ Friday  ?

## 2022-01-15 NOTE — Discharge Instructions (Signed)
?  Strep screening negative. Suspect viral cause of symptoms. Continue symptomatic treatment, increased fluids and rest.  ? ? ?

## 2022-01-16 LAB — COVID-19, FLU A+B NAA
Influenza A, NAA: NOT DETECTED
Influenza B, NAA: NOT DETECTED
SARS-CoV-2, NAA: NOT DETECTED

## 2022-01-18 LAB — CULTURE, GROUP A STREP (THRC)

## 2022-03-20 IMAGING — MR MR ABDOMEN WO/W CM MRCP
12 of 22 series · 21 of 48 positions shown · IV contrast (gadavist)
Comparison: Same day abdominal ultrasound.

CLINICAL DATA: Concern for choledocholithiasis. Biliary obstruction
suspected.

EXAM:
MRI ABDOMEN WITHOUT AND WITH CONTRAST (INCLUDING MRCP)
TECHNIQUE: Multiplanar multisequence MR imaging of the abdomen was performed
both before and after the administration of intravenous contrast.
Heavily T2-weighted images of the biliary and pancreatic ducts were
obtained, and three-dimensional MRCP images were rendered by post
processing.
CONTRAST:  8.5mL GADAVIST GADOBUTROL 1 MMOL/ML IV SOLN

[Series 3: cor ssfse nav · coronal · 6.0mm · 0.74mm/px · 1 of 44 slices shown]
[im 1/44]
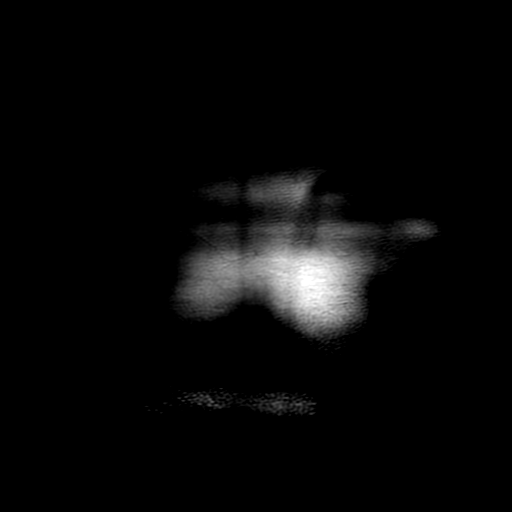

[Series 4: ax ssfse nav · axial · 6.0mm · 0.74mm/px · 1 of 45 slices shown]
[im 1/45]
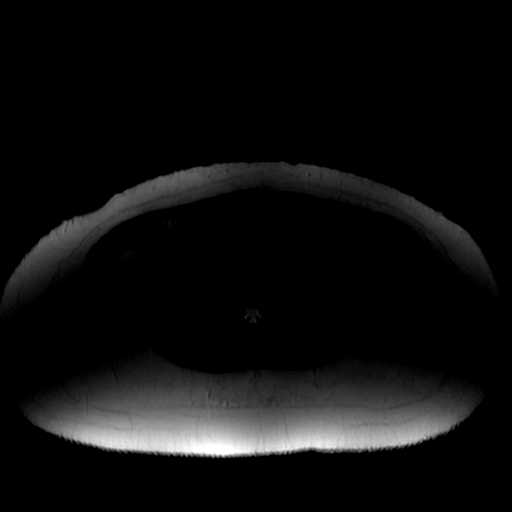

[Series 5: DWI b500 · axial · 8.0mm · 1.48mm/px · 1 of 64 slices shown]
[im 1/64]
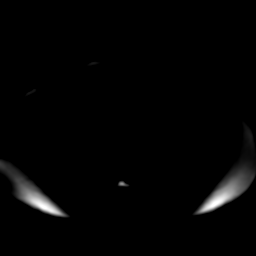

[Series 6: T2 fat-sat · axial · 6.0mm · 0.74mm/px · 1 of 39 slices shown]
[im 1/39]
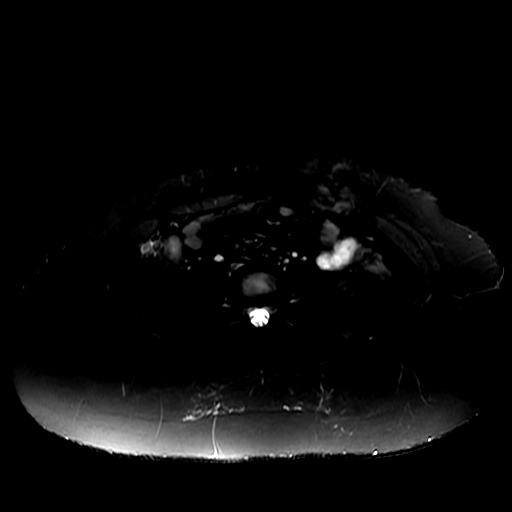

[Series 9: radial 2d thick · coronal · 40.0mm · 0.86mm/px · 1 of 6 slices shown]
[im 1/6]
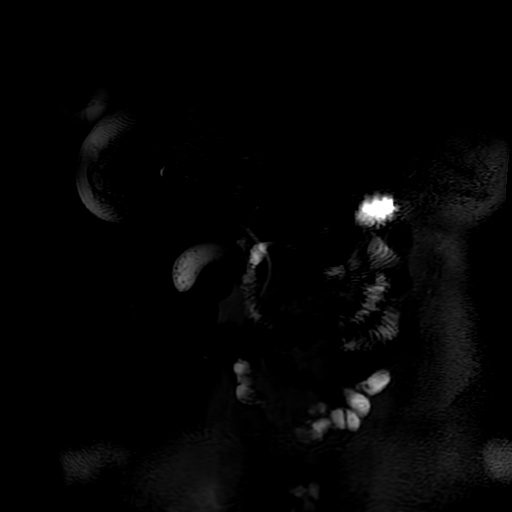

[Series 10: bSSFP · coronal · 6.0mm · 0.74mm/px · 1 of 43 slices shown]
[im 1/43]
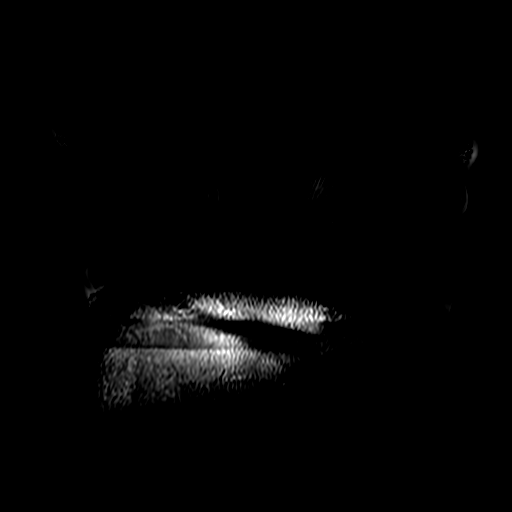

[Series 12: T1 dynamic · coronal · 3.3mm · 1.48mm/px · 2 of 152 slices shown]
[im 1/152]
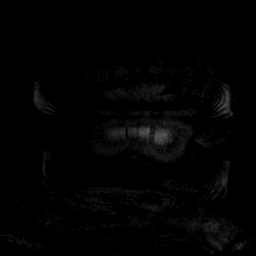
[im 152/152]
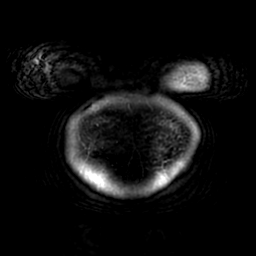

[Series 750: projection images · oblique · 1.2mm · 0.60mm/px · 1 of 8 slices shown]
[im 1/8]
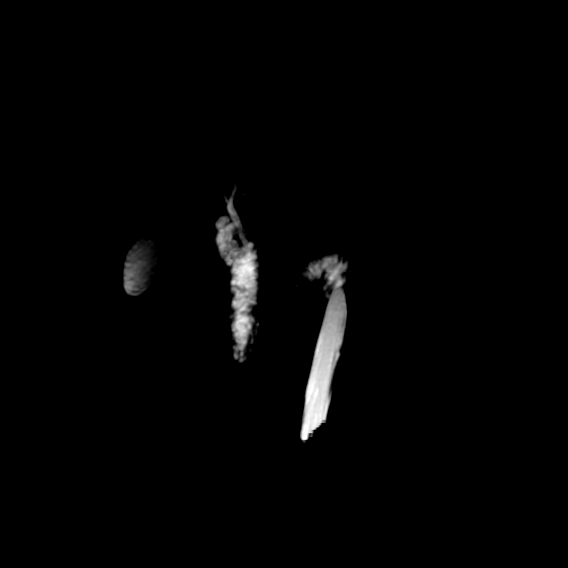

[Series 1100: T1 dynamic post-contrast · axial · non-contrast · 4.0mm · 0.78mm/px · z∈[-95,+135]mm · 3 of 116 slices shown (1 of 4)]
[im 1/116]
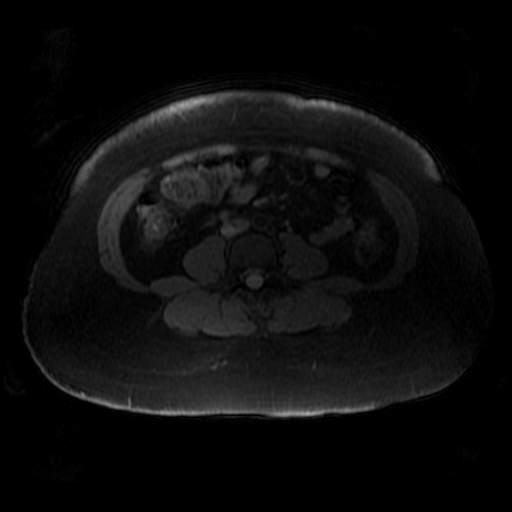
[im 58/116]
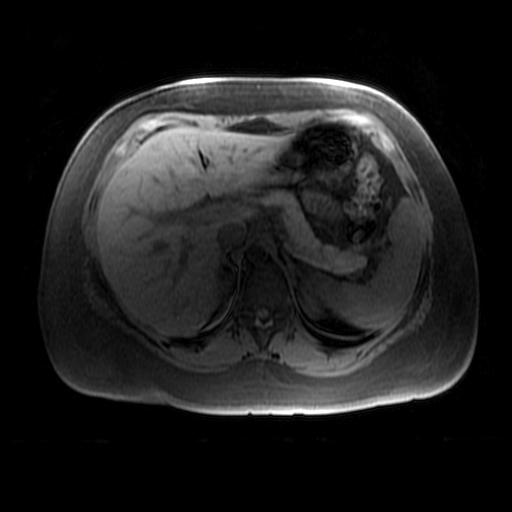
[im 116/116]
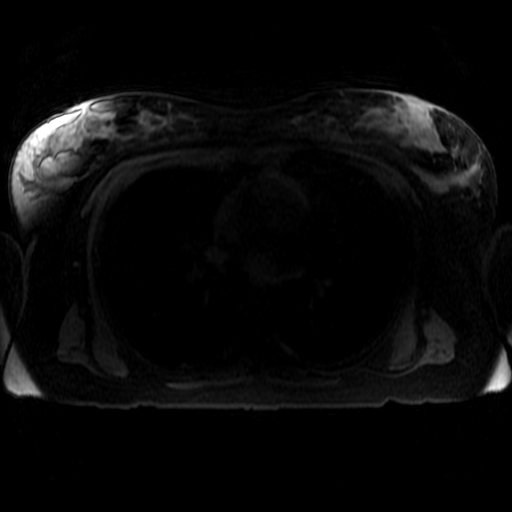

[Series 1101: T1 dynamic post-contrast · axial · non-contrast · 4.0mm · 0.78mm/px · z∈[-95,+135]mm · 3 of 116 slices shown (2 of 4)]
[im 1/116]
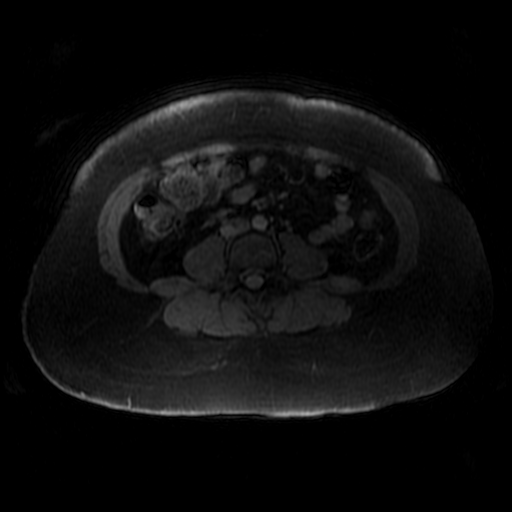
[im 58/116]
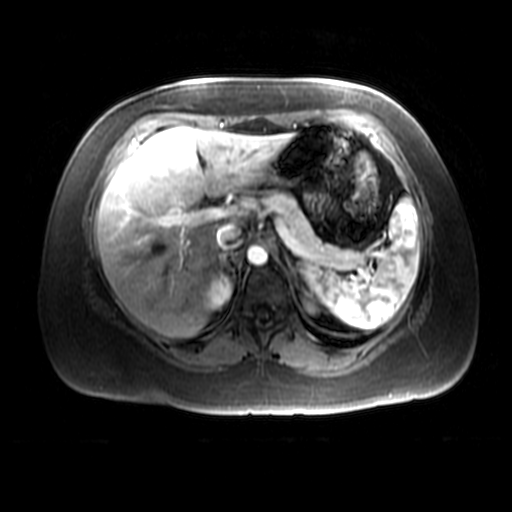
[im 116/116]
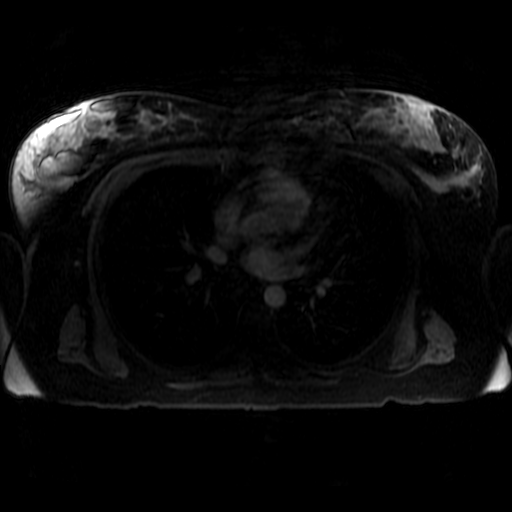

[Series 1102: T1 dynamic post-contrast · axial · non-contrast · 4.0mm · 0.78mm/px · z∈[-95,+135]mm · 3 of 116 slices shown (3 of 4)]
[im 1/116]
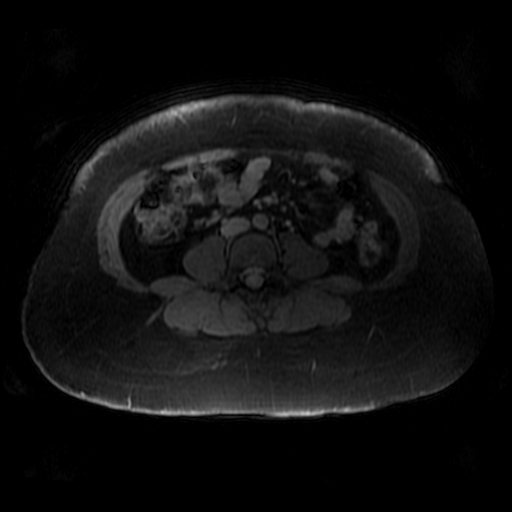
[im 58/116]
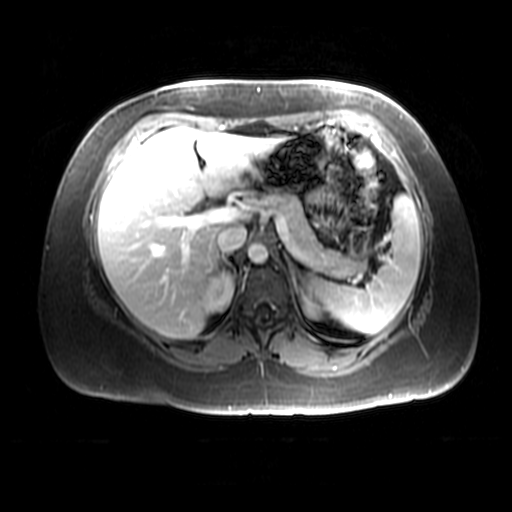
[im 116/116]
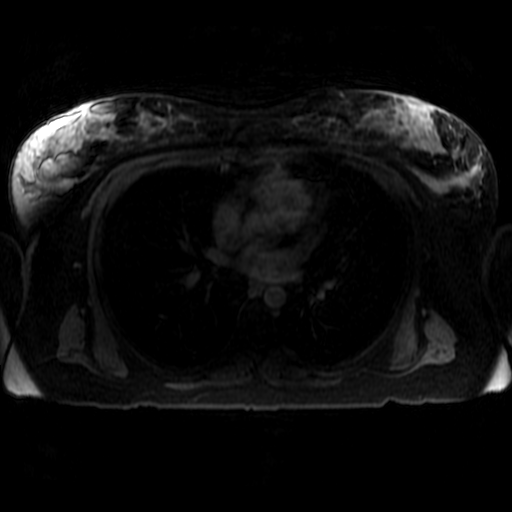

[Series 1103: T1 dynamic post-contrast · axial · non-contrast · 4.0mm · 0.78mm/px · z∈[-95,+135]mm · 3 of 116 slices shown (4 of 4)]
[im 1/116]
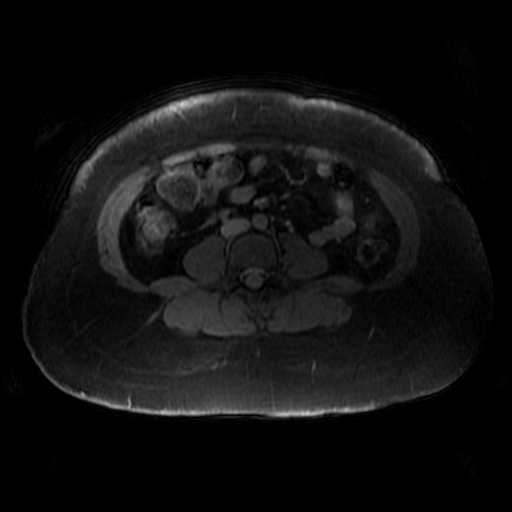
[im 58/116]
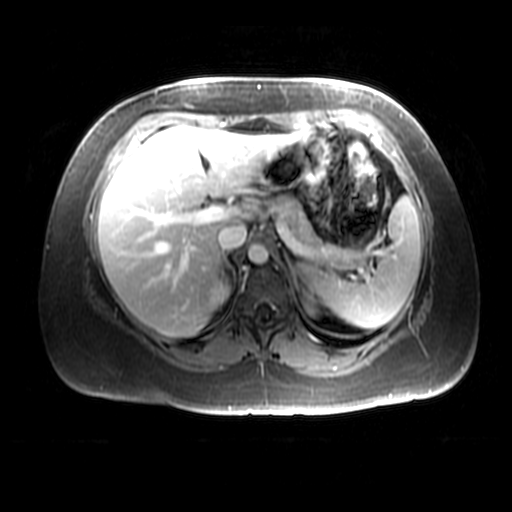
[im 116/116]
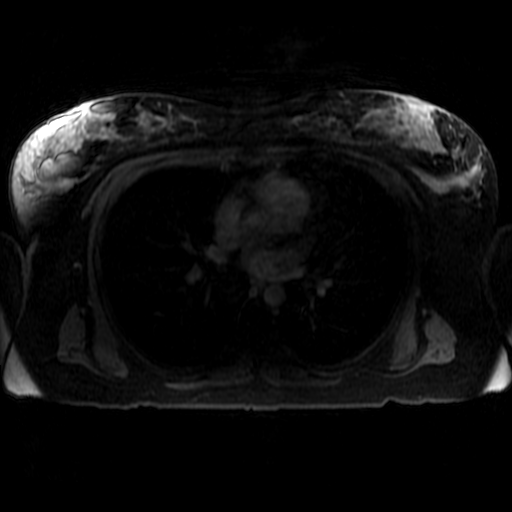

[21 of 48 positions shown; findings below may reference images not displayed]

FINDINGS: Lower chest: No acute findings.

Hepatobiliary: No focal hepatic lesion. Numerous coli lithiasis
measuring between 2 and 4 mm. No gallbladder wall thickening or
pericholecystic fluid. No biliary ductal dilatation. The common duct
measures 4 mm without visualized intraluminal filling defect.

Pancreas: No mass, inflammatory changes, or other parenchymal
abnormality identified.

Spleen:  Splenule

Adrenals/Urinary Tract: No masses identified. No evidence of
hydronephrosis.

Stomach/Bowel: Visualized portions within the abdomen are
unremarkable.

Vascular/Lymphatic: No pathologically enlarged lymph nodes
identified. No abdominal aortic aneurysm demonstrated.

Other:  None.

Musculoskeletal: No suspicious bone lesions identified.
IMPRESSION: 1. No biliary ductal dilatation or visualized intraluminal filling
defect, to suggest choledocholithiasis.
2. Cholelithiasis measuring between 2 and 4 mm. No evidence of acute
cholecystitis.

## 2022-04-13 ENCOUNTER — Other Ambulatory Visit: Payer: Self-pay

## 2022-04-13 DIAGNOSIS — Q7959 Other congenital malformations of abdominal wall: Secondary | ICD-10-CM | POA: Diagnosis not present

## 2022-04-13 DIAGNOSIS — G8929 Other chronic pain: Secondary | ICD-10-CM | POA: Diagnosis not present

## 2022-04-13 DIAGNOSIS — K5900 Constipation, unspecified: Secondary | ICD-10-CM | POA: Insufficient documentation

## 2022-04-13 DIAGNOSIS — K5790 Diverticulosis of intestine, part unspecified, without perforation or abscess without bleeding: Secondary | ICD-10-CM | POA: Diagnosis not present

## 2022-04-13 DIAGNOSIS — R109 Unspecified abdominal pain: Secondary | ICD-10-CM | POA: Diagnosis present

## 2022-04-13 LAB — CBC
HCT: 43.7 % (ref 36.0–46.0)
Hemoglobin: 13.7 g/dL (ref 12.0–15.0)
MCH: 26.9 pg (ref 26.0–34.0)
MCHC: 31.4 g/dL (ref 30.0–36.0)
MCV: 85.7 fL (ref 80.0–100.0)
Platelets: 385 10*3/uL (ref 150–400)
RBC: 5.1 MIL/uL (ref 3.87–5.11)
RDW: 13.4 % (ref 11.5–15.5)
WBC: 10.4 10*3/uL (ref 4.0–10.5)
nRBC: 0 % (ref 0.0–0.2)

## 2022-04-13 LAB — COMPREHENSIVE METABOLIC PANEL
ALT: 39 U/L (ref 0–44)
AST: 28 U/L (ref 15–41)
Albumin: 4.2 g/dL (ref 3.5–5.0)
Alkaline Phosphatase: 79 U/L (ref 38–126)
Anion gap: 7 (ref 5–15)
BUN: 18 mg/dL (ref 6–20)
CO2: 25 mmol/L (ref 22–32)
Calcium: 9.8 mg/dL (ref 8.9–10.3)
Chloride: 106 mmol/L (ref 98–111)
Creatinine, Ser: 0.72 mg/dL (ref 0.44–1.00)
GFR, Estimated: >60 mL/min (ref >60)
Glucose, Bld: 96 mg/dL (ref 70–99)
Potassium: 4.2 mmol/L (ref 3.5–5.1)
Sodium: 138 mmol/L (ref 135–145)
Total Bilirubin: 0.7 mg/dL (ref 0.3–1.2)
Total Protein: 8.5 g/dL — ABNORMAL HIGH (ref 6.5–8.1)

## 2022-04-13 LAB — URINALYSIS, ROUTINE W REFLEX MICROSCOPIC
Bilirubin Urine: NEGATIVE
Glucose, UA: NEGATIVE mg/dL
Hgb urine dipstick: NEGATIVE
Ketones, ur: NEGATIVE mg/dL
Leukocytes,Ua: NEGATIVE
Nitrite: NEGATIVE
Protein, ur: NEGATIVE mg/dL
Specific Gravity, Urine: 1.023 (ref 1.005–1.030)
pH: 5 (ref 5.0–8.0)

## 2022-04-13 LAB — POC URINE PREG, ED: Preg Test, Ur: NEGATIVE

## 2022-04-13 LAB — LIPASE, BLOOD: Lipase: 34 U/L (ref 11–51)

## 2022-04-13 NOTE — ED Triage Notes (Signed)
Pt presents to ER c/o abd pain that has been going on since having baby on 12/05/21.  Pt states pain is in epigastric area and is worse after eating and when she has to have BM.  Pt states baby was born vaginally and there were no complications.  Does have prior hx of cholecystectomy.  Denies any other medical problems.  Pt is A&O x4 at this time in NAD.

## 2022-04-14 ENCOUNTER — Emergency Department
Admission: EM | Admit: 2022-04-14 | Discharge: 2022-04-14 | Disposition: A | Discharge status: Home / Self Care | Payer: Medicaid Other | Attending: Emergency Medicine | Admitting: Emergency Medicine

## 2022-04-14 ENCOUNTER — Emergency Department: Payer: Medicaid Other

## 2022-04-14 DIAGNOSIS — G8929 Other chronic pain: Secondary | ICD-10-CM

## 2022-04-14 DIAGNOSIS — K5900 Constipation, unspecified: Secondary | ICD-10-CM

## 2022-04-14 DIAGNOSIS — M6208 Separation of muscle (nontraumatic), other site: Secondary | ICD-10-CM

## 2022-04-14 MED ORDER — ONDANSETRON 4 MG PO TBDP: 4.0000 mg | ORAL_TABLET | Freq: Four times a day (QID) | ORAL | 0 refills | Status: DC | PRN

## 2022-04-14 MED ORDER — IOHEXOL 300 MG/ML  SOLN
100.0000 mL | Freq: Once | INTRAMUSCULAR | Status: AC | PRN
  Administered 2022-04-14: 100 mL via INTRAVENOUS

## 2022-04-14 MED ORDER — DICYCLOMINE HCL 10 MG PO CAPS
10.0000 mg | ORAL_CAPSULE | Freq: Once | ORAL | Status: AC
  Administered 2022-04-14: 10 mg via ORAL
  Filled 2022-04-14: qty 1

## 2022-04-14 MED ORDER — POLYETHYLENE GLYCOL 3350 17 G PO PACK: 17.0000 g | PACK | Freq: Every day | ORAL | 0 refills | Status: DC

## 2022-04-14 MED ORDER — DICYCLOMINE HCL 20 MG PO TABS: 20.0000 mg | ORAL_TABLET | Freq: Three times a day (TID) | ORAL | 0 refills | Status: DC | PRN

## 2022-04-14 MED ORDER — DOCUSATE SODIUM 100 MG PO CAPS: 100.0000 mg | ORAL_CAPSULE | Freq: Two times a day (BID) | ORAL | 0 refills | Status: AC

## 2022-04-14 MED ORDER — ONDANSETRON 4 MG PO TBDP
4.0000 mg | ORAL_TABLET | Freq: Once | ORAL | Status: AC
  Administered 2022-04-14: 4 mg via ORAL
  Filled 2022-04-14: qty 1

## 2022-04-14 NOTE — Discharge Instructions (Addendum)
I recommend that you increase your water and fiber intake. If you are not able to eat foods high in fiber, you may use Benefiber or Metamucil over-the-counter. I also recommend you use MiraLAX 1-2 times a day and Colace 100 mg twice a day to help with bowel movements. These medications are over the counter.  You may use other over-the-counter medications such as Dulcolax, Fleet enemas, magnesium citrate as needed for constipation. Please note that some of these medications may cause you to have abdominal cramping which is normal. If you develop severe abdominal pain, fever (temperature of 100.4 or higher), persistent vomiting, distention of your abdomen, unable to have a bowel movement for 5 days or are not passing gas, please return to the hospital.  Steps to find a Primary Care Provider (PCP):  Call [phone] or [phone] to access "[place] Find a Doctor Service."  2.  You may also go on the [facility] website at [email or web address]

## 2022-04-14 NOTE — ED Provider Notes (Signed)
[facility] Provider Note    Event Date/Time   First MD Initiated Contact with Patient 04/14/22 0132     (approximate)   History   Abdominal Pain   HPI  Kayla [name] is a 26 y.o. female with history of gallstones status postcholecystectomy who is a G3 P3 who presents to the emergency department with complaints upper abdominal pain that has been ongoing since uncomplicated vaginal delivery at 40 and 1 on 12/05/2021.  She reports intermittent nausea but no vomiting or diarrhea.  She denies dysuria, hematuria, vaginal bleeding or discharge.  States she is here because this is the first time she can get someone to watch her child.  Patient is here with her husband.  States pain is worse after eating and worse with bowel movements.   History provided by patient and husband.    Past Medical History:  Diagnosis Date   Anemia    pregnancy   Chlamydia infection 04/03/2021   Gallstones 10/2020   Headache     Past Surgical History:  Procedure Laterality Date   CHOLECYSTECTOMY N/A 04/04/2021   Procedure: LAPAROSCOPIC CHOLECYSTECTOMY;  Surgeon: Axel Filler, MD;  Location: [facility] OR;  Service: General;  Laterality: N/A;    MEDICATIONS:  Prior to Admission medications   Medication Sig Start Date End Date Taking? Authorizing Provider  ibuprofen (ADVIL) 600 MG tablet Take 1 tablet (600 mg total) by mouth every 6 (six) hours as needed for moderate pain or cramping. 12/07/21   Edwinna Areola, DO  norgestimate-ethinyl estradiol (ORTHO-CYCLEN) 0.25-35 MG-MCG tablet Take 1 tablet by mouth daily. 01/06/22   Katrinka Blazing, [place], CNM  Prenatal Vit-Fe Fumarate-FA (MULTIVITAMIN-PRENATAL) 27-0.8 MG TABS tablet Take 1 tablet by mouth daily at 12 noon.    [provider]  promethazine (PHENERGAN) 25 MG tablet Take 1 tablet (25 mg total) by mouth every 6 (six) hours as needed for nausea or vomiting. Patient not taking: No sig reported 01/13/21 03/25/21  Aviva Signs, CNM    Physical Exam   Triage Vital Signs: ED Triage Vitals [04/13/22 2212]  Enc Vitals Group     BP 138/88     Pulse Rate 78     Resp 16     Temp 98.6 F (37 C)     Temp Source Oral     SpO2 95 %     Weight 200 lb (90.7 kg)     Height 5\' 4"  (1.626 m)     Head Circumference      Peak Flow      Pain Score 4     Pain Loc      Pain Edu?      Excl. in GC?     Most recent vital signs: Vitals:   04/13/22 2212 04/14/22 0135  BP: 138/88 122/85  Pulse: 78 82  Resp: 16 17  Temp: 98.6 F (37 C)   SpO2: 95% 96%    CONSTITUTIONAL: Alert and oriented and responds appropriately to questions. Well-appearing; well-nourished HEAD: Normocephalic, atraumatic EYES: Conjunctivae clear, pupils appear equal, sclera nonicteric ENT: normal nose; moist mucous membranes NECK: Supple, normal ROM CARD: RRR; S1 and S2 appreciated; no murmurs, no clicks, no rubs, no gallops RESP: Normal chest excursion without splinting or tachypnea; breath sounds clear and equal bilaterally; no wheezes, no rhonchi, no rales, no hypoxia or respiratory distress, speaking full sentences ABD/GI: Normal bowel sounds; non-distended; soft, non-tender, no rebound, no guarding, no peritoneal signs BACK: The back appears normal EXT:  Normal ROM in all joints; no deformity noted, no edema; no cyanosis SKIN: Normal color for age and race; warm; no rash on exposed skin NEURO: Moves all extremities equally, normal speech PSYCH: The patient's mood and manner are appropriate.   ED Results / Procedures / Treatments   LABS: (all labs ordered are listed, but only abnormal results are displayed) Labs Reviewed  COMPREHENSIVE METABOLIC PANEL - Abnormal; Notable for the following components:      Result Value   Total Protein 8.5 (*)    All other components within normal limits  URINALYSIS, ROUTINE W REFLEX MICROSCOPIC - Abnormal; Notable for the following components:   Color, Urine YELLOW (*)    APPearance CLEAR (*)     All other components within normal limits  LIPASE, BLOOD  CBC  POC URINE PREG, ED     EKG:     RADIOLOGY: My personal review and interpretation of imaging: CT scan shows constipation but no other acute abnormality.  I have personally reviewed all radiology reports.   CT ABDOMEN PELVIS W CONTRAST  Result Date: 04/14/2022 CLINICAL DATA:  Worsening epigastric pain since the patient gave birth 12/05/2021. EXAM: CT ABDOMEN AND PELVIS WITH CONTRAST TECHNIQUE: Multidetector CT imaging of the abdomen and pelvis was performed using the standard protocol following bolus administration of intravenous contrast. RADIATION DOSE REDUCTION: This exam was performed according to the departmental dose-optimization program which includes automated exposure control, adjustment of the mA and/or kV according to patient size and/or use of iterative reconstruction technique. CONTRAST:  OMNIPAQUE IOHEXOL 300 MG/ML  SOLN COMPARISON:  None Available. FINDINGS: Lower chest: No acute abnormality. Hepatobiliary: No hepatic mass enhancement is seen. Gallbladder is absent. There is no biliary dilatation. Pancreas: Unremarkable. No pancreatic ductal dilatation or surrounding inflammatory changes. Spleen: No focal abnormality or splenomegaly. Small splenule at the lower medial splenic edge. Adrenals/Urinary Tract: There is no adrenal mass. No focal abnormality of the renal cortex. There is no urinary stone or obstruction. The bladder contracted but unremarkable for the degree of distention. Stomach/Bowel: No dilatation or wall thickening including the appendix. Moderate stool retention. Scattered left-sided diverticula without diverticulitis or focal colitis. Vascular/Lymphatic: No significant vascular findings are present. No enlarged abdominal or pelvic lymph nodes. Reproductive: The uterus is intact. The ovaries are follicular with dominant 2.1 cm follicle of the right ovary. No adnexal mass is seen. Other: Mild  umbilical rectus diastasis and small umbilical fat hernia. There is no incarcerated hernia. There is no free air, hemorrhage or fluid. Musculoskeletal: Multilevel endplate Schmorl's nodes in the visualized lower thoracic spine with mild lower thoracic levoscoliosis. There are additional endplate Schmorl's nodes in the lumbar spine to L3-4. No acute or further significant osseous findings. IMPRESSION: 1. No acute abdominal or pelvic findings. 2. Constipation. 3. Scattered diverticula without evidence of diverticulitis. 4. Umbilical rectus diastasis and fat hernia. 5. Endplate degenerative changes in the thoracic and upper lumbar spine. Electronically Signed   By: Almira Bar M.D.   On: 04/14/2022 02:18     PROCEDURES:  Critical Care performed: No      Procedures    IMPRESSION / MDM / ASSESSMENT AND PLAN / ED COURSE  I reviewed the triage vital signs and the nursing notes.    Patient here with complaints of abdominal pain since the beginning of February.  States recently moved to this area and does not have a PCP, OB/GYN or gastroenterologist.   DIFFERENTIAL DIAGNOSIS (includes but not limited to):   Chronic abdominal pain,  constipation, colitis, diverticulitis, UTI, less likely appendicitis   Patient's presentation is most consistent with acute presentation with potential threat to life or bodily function.   PLAN: We will obtain CBC, CMP, lipase, urinalysis, CT of the abdomen pelvis.  Will give Bentyl and Zofran.  She is not breast-feeding.   MEDICATIONS GIVEN IN ED: Medications  dicyclomine (BENTYL) capsule 10 mg (10 mg Oral Given 04/14/22 0153)  ondansetron (ZOFRAN-ODT) disintegrating tablet 4 mg (4 mg Oral Given 04/14/22 0153)  iohexol (OMNIPAQUE) 300 MG/ML solution 100 mL (100 mLs Intravenous Contrast Given 04/14/22 0157)     ED COURSE: Patient's labs show no leukocytosis.  Normal hemoglobin.  Normal electrolytes, renal function, LFTs, lipase.  Urine shows no sign of  infection or dehydration.  Pregnancy test negative.  CT reviewed/interpreted by myself and radiologist and shows no acute findings.  She does have constipation without bowel obstruction.  She has diverticulosis without diverticulitis.  She has diastases recti which she states she was aware of.  She is feeling better after Bentyl and Zofran and tolerating p.o.  Discussed bowel regimen with patient for her constipation, increase water and fiber intake.  Will discharge with prescriptions of Bentyl and Zofran.  Have offered her PCP, GI and OB/GYN follow-up.   At this time, I do not feel there is any life-threatening condition present. I reviewed all nursing notes, vitals, pertinent previous records.  All lab and urine results, EKGs, imaging ordered have been independently reviewed and interpreted by myself.  I reviewed all available radiology reports from any imaging ordered this visit.  Based on my assessment, I feel the patient is safe to be discharged home without further emergent workup and can continue workup as an outpatient as needed. Discussed all findings, treatment plan as well as usual and customary return precautions with patient and husband.  They verbalize understanding and are comfortable with this plan.  Outpatient follow-up has been provided as needed.  All questions have been answered.    CONSULTS: No emergent consult needed at this time.  Work-up has been unrevealing, reassuring.  Pain has been ongoing for over 4 months.   OUTSIDE RECORDS REVIEWED: Reviewed patient's delivery note in February 2023.       FINAL CLINICAL IMPRESSION(S) / ED DIAGNOSES   Final diagnoses:  Chronic abdominal pain  Constipation, unspecified constipation type  Diastasis recti     Rx / DC Orders   ED Discharge Orders          Ordered    polyethylene glycol (MIRALAX) 17 g packet  Daily        04/14/22 0227    docusate sodium (COLACE) 100 MG capsule  2 times daily        04/14/22 0227     ondansetron (ZOFRAN-ODT) 4 MG disintegrating tablet  Every 6 hours PRN        04/14/22 0227    dicyclomine (BENTYL) 20 MG tablet  Every 8 hours PRN        04/14/22 0227             Note:  This document was prepared using Dragon voice recognition software and may include unintentional dictation errors.   Zayde Stroupe, Layla Maw, DO 04/14/22 0236

## 2022-04-27 IMAGING — US US ABDOMEN LIMITED RUQ/ASCITES
1 series · 15 of 25 positions shown · non-contrast
Comparison: [DATE] [DATE], [DATE] ([DATE] a.m.)

CLINICAL DATA: Right upper quadrant pain.

EXAM:
ULTRASOUND ABDOMEN LIMITED RIGHT UPPER QUADRANT

[Series 1: us abdomen limited ruq/ascites · 15 of 60 slices shown]
[im 1/60]
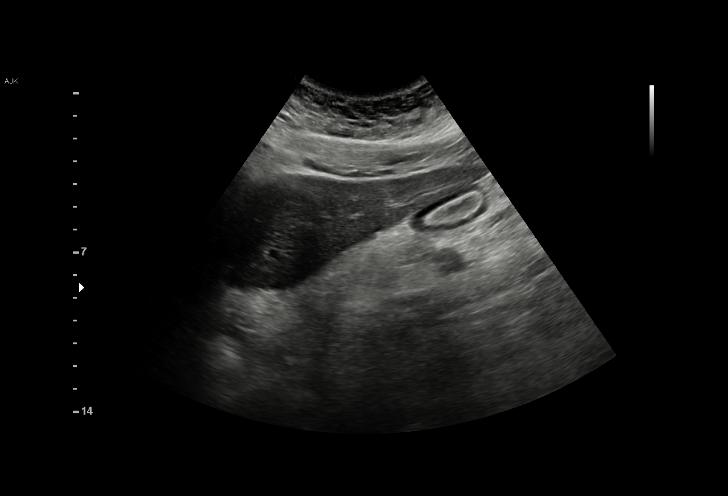
[im 5/60]
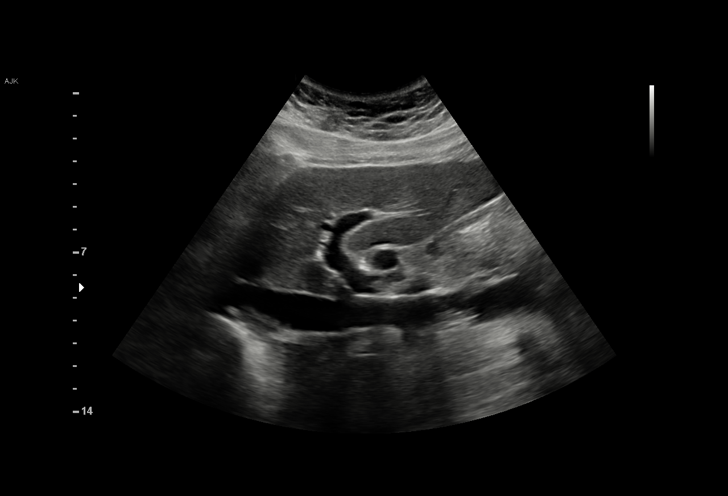
[im 10/60]
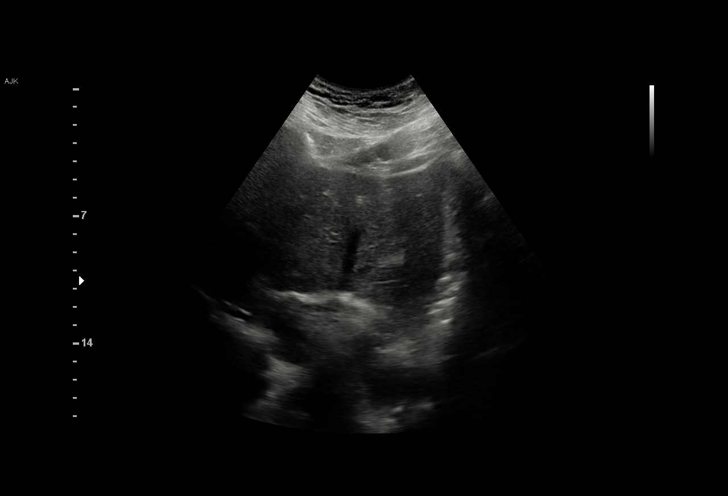
[im 13/60]
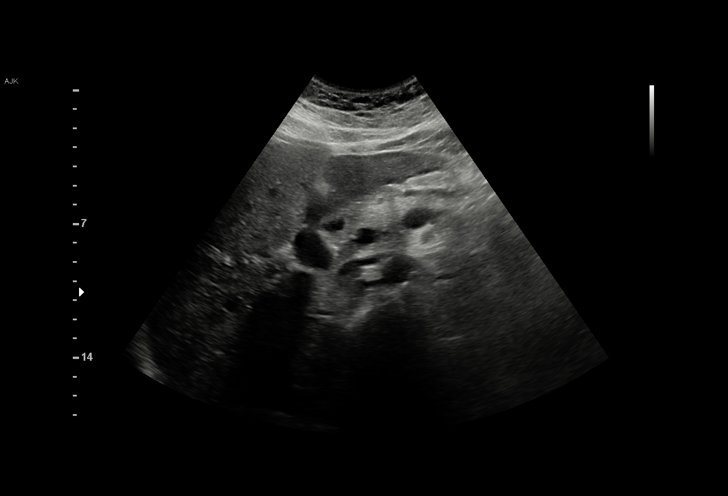
[im 18/60]
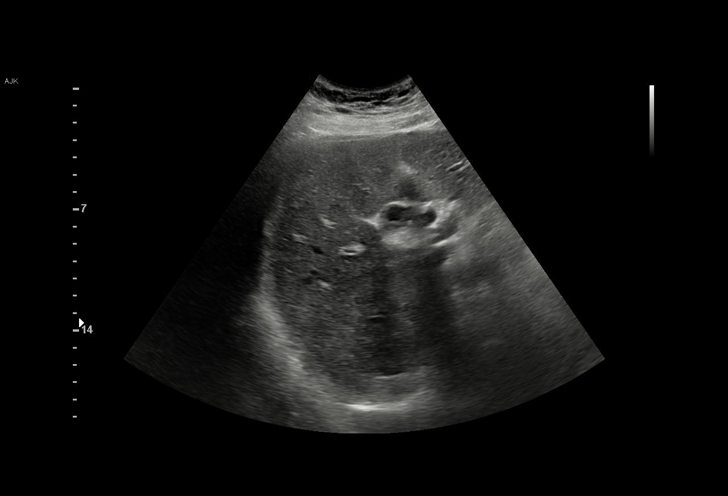
[im 23/60]
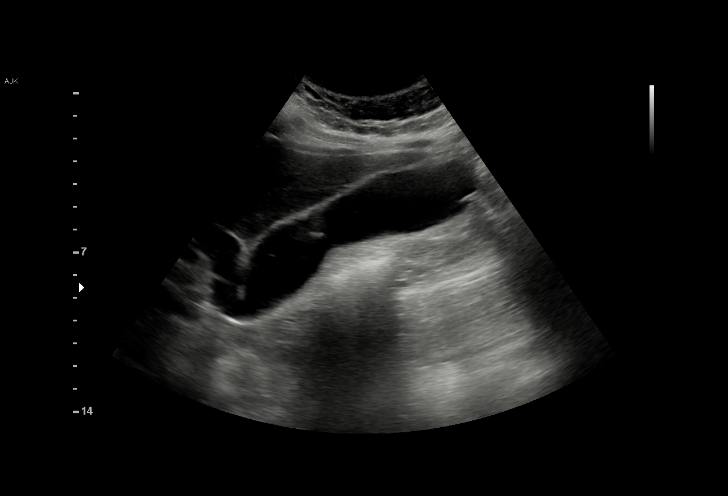
[im 25/60]
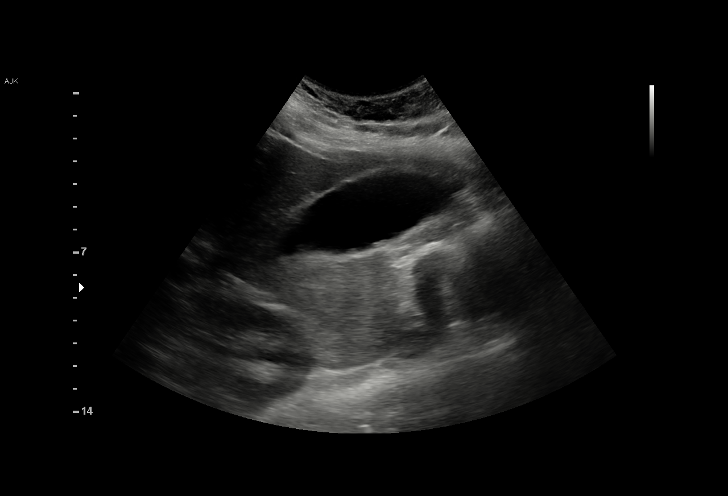
[im 30/60]
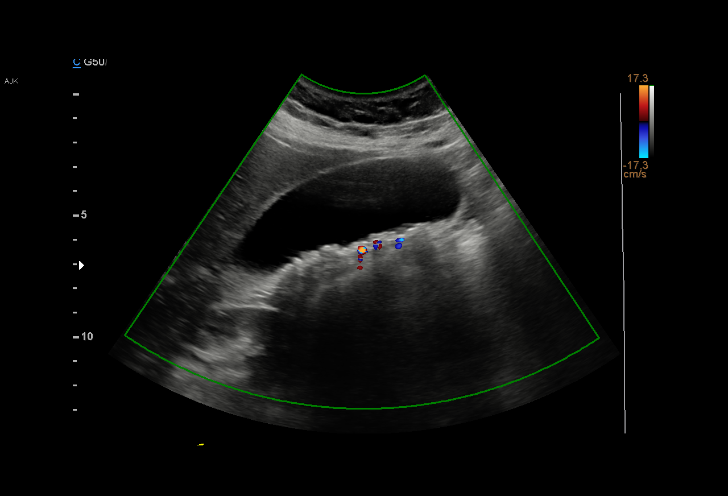
[im 35/60]
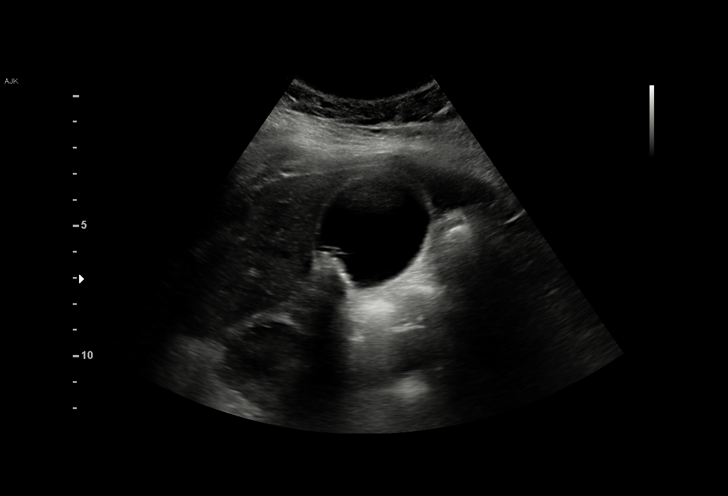
[im 37/60]
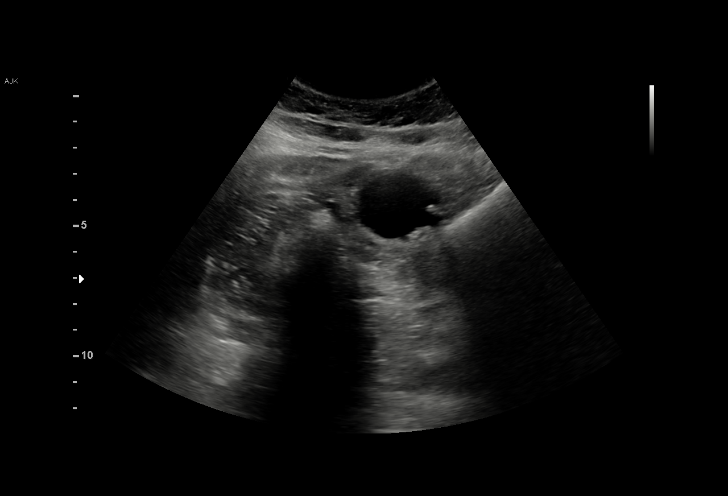
[im 42/60]
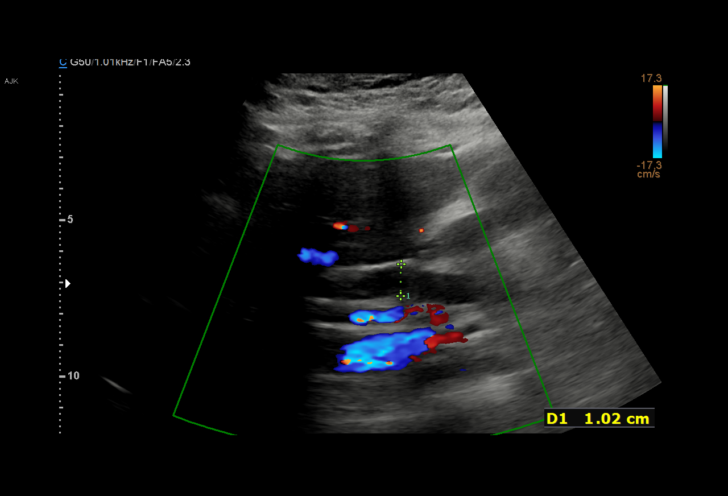
[im 47/60]
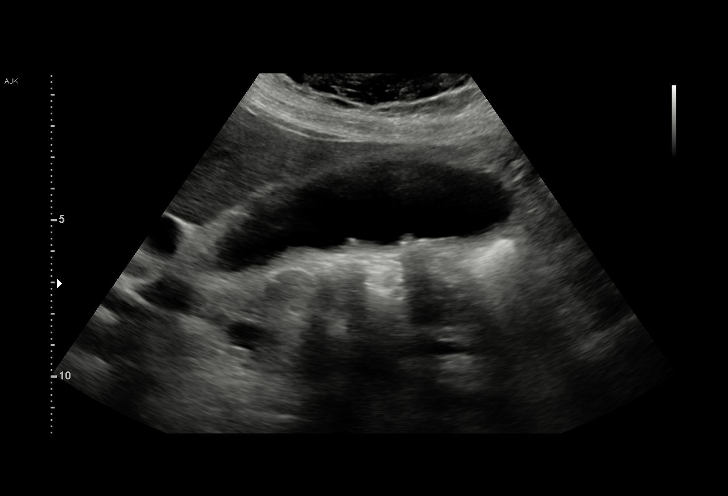
[im 50/60]
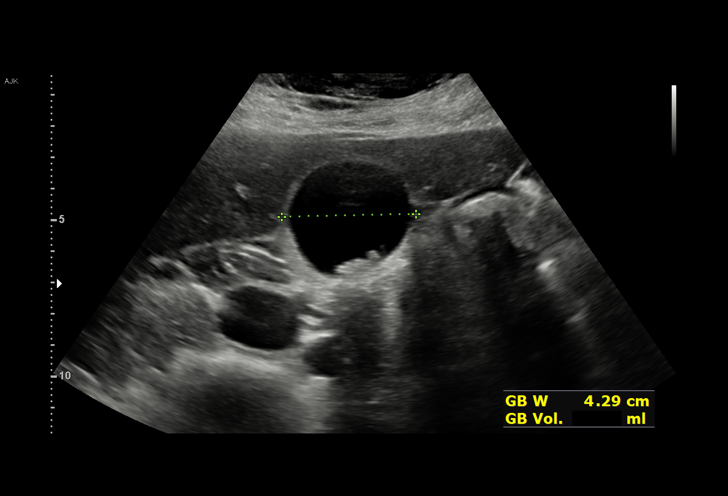
[im 55/60]
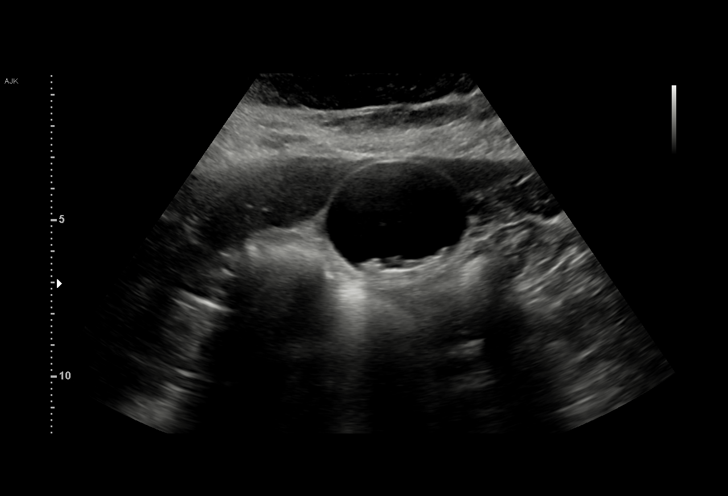
[im 60/60]
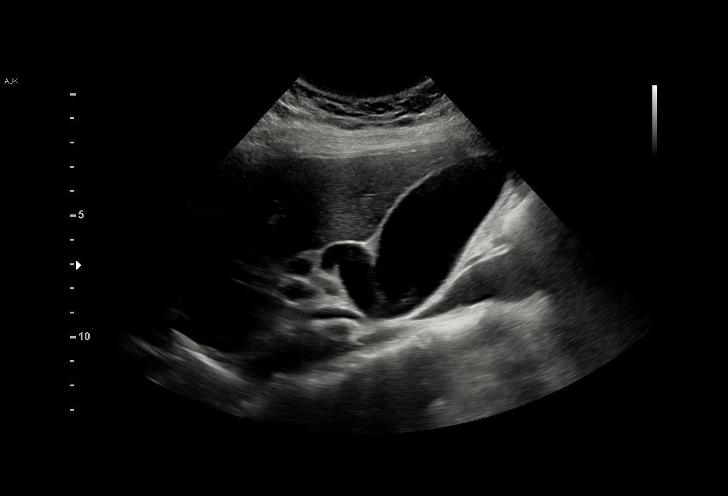

[15 of 25 positions shown; findings below may reference images not displayed]

FINDINGS: Gallbladder:

Innumerable shadowing, echogenic gallstones are seen within a
distended gallbladder (11.9 cm x 3.7 cm x 4.3 cm). The gallbladder
wall measures 2.8 mm in thickness. No sonographic Murphy sign noted
by sonographer.

Common bile duct:

Diameter: The common bile duct is dilated and measures 10.2 mm.

Liver:

No focal lesion identified. Within normal limits in parenchymal
echogenicity. Portal vein is patent on color Doppler imaging with
normal direction of blood flow towards the liver.

Other: None.
IMPRESSION: 1. Cholelithiasis and common bile duct dilatation in the absence of
a positive sonographic Murphy's sign. These findings are noted on
the prior study and could represent early acute cholecystitis in the
proper clinical setting.

## 2022-06-02 IMAGING — US US OB TRANSVAGINAL
1 series · 15 of 28 positions shown · non-contrast
Comparison: 02/13/2021.

CLINICAL DATA: Vaginal bleeding, ultrasound on 02/13/2021 with
intrauterine pregnancy with fetal pole but without yolk sac or fetal
heart rate

EXAM:
OBSTETRIC <14 WK ULTRASOUND
TECHNIQUE: Transabdominal ultrasound was performed for evaluation of the
gestation as well as the maternal uterus and adnexal regions.

[Series 1: us ob transvaginal · 15 of 33 slices shown]
[im 1/33]
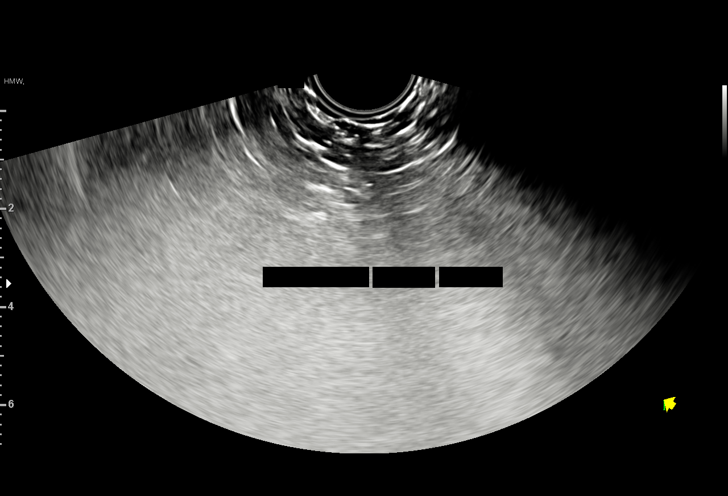
[im 3/33]
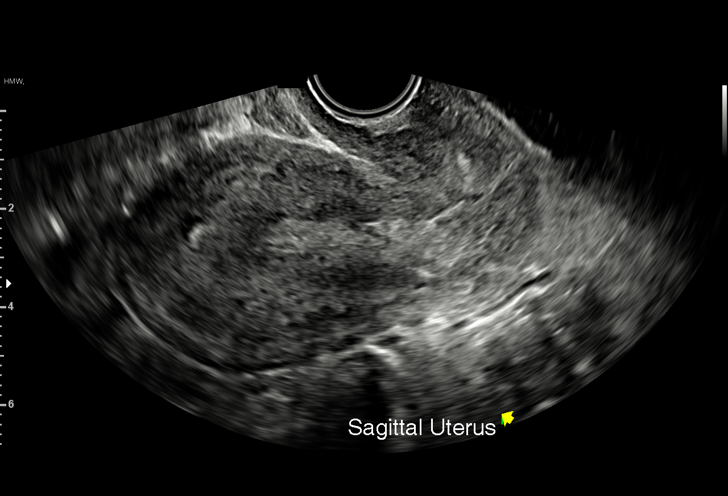
[im 5/33]
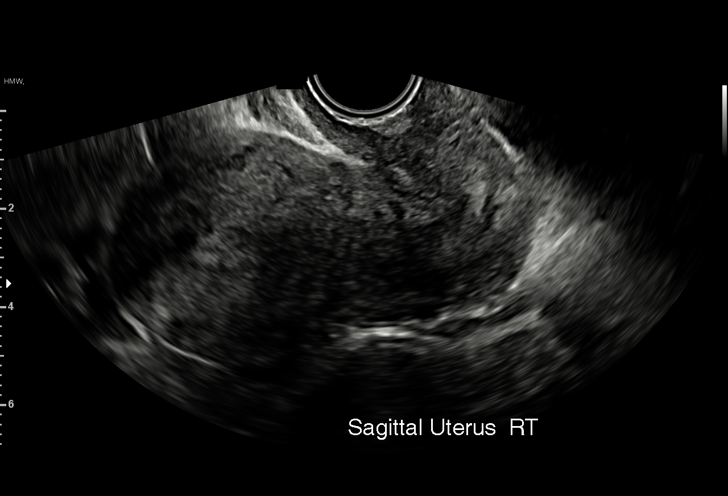
[im 8/33]
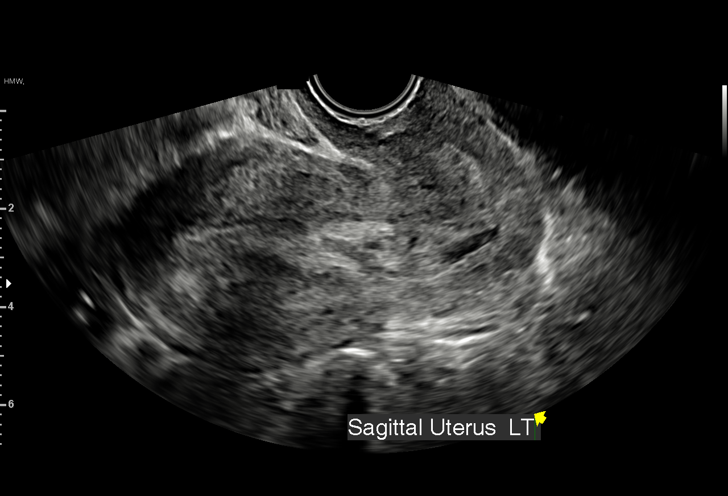
[im 10/33]
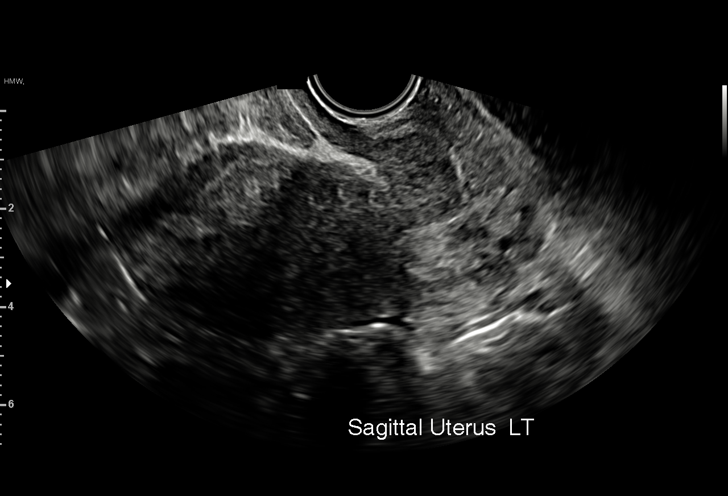
[im 12/33]
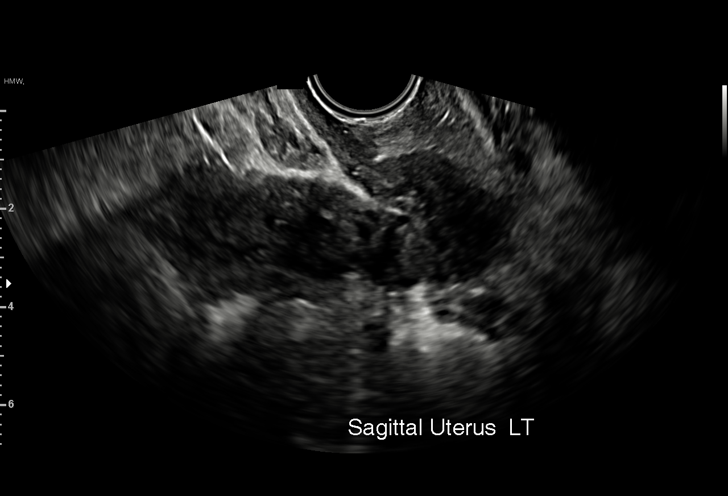
[im 15/33]
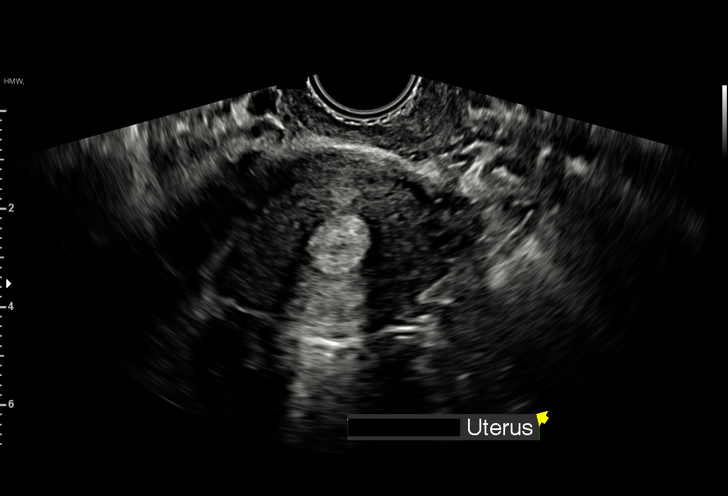
[im 17/33]
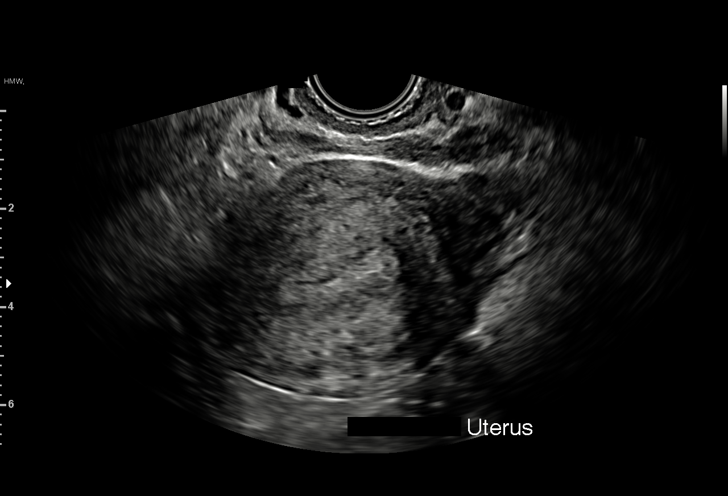
[im 18/33]
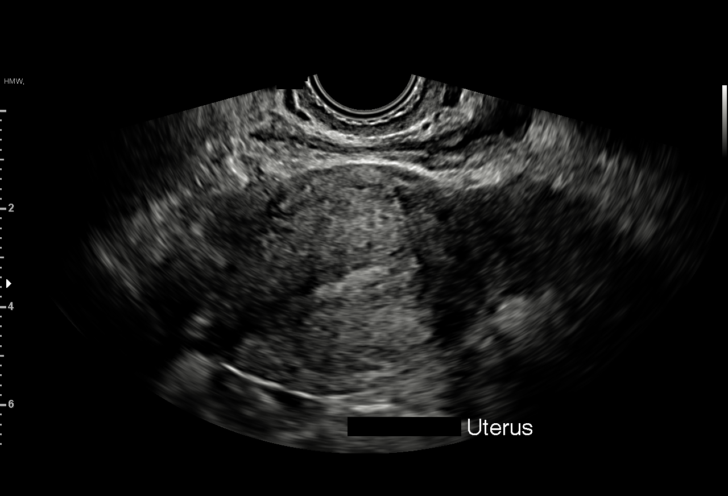
[im 21/33]
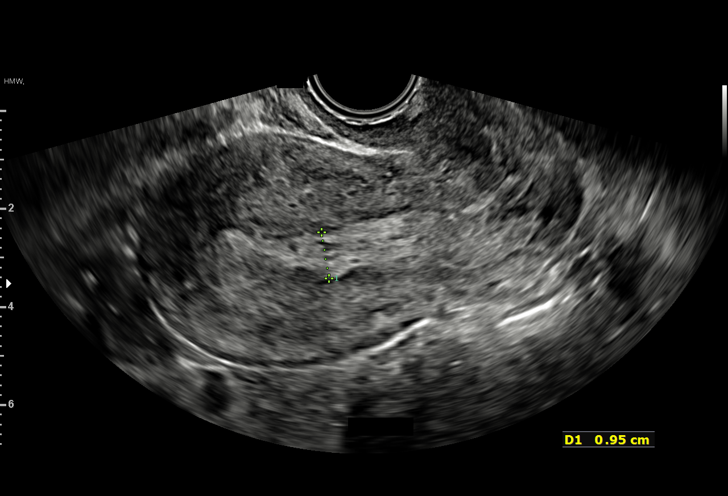
[im 23/33]
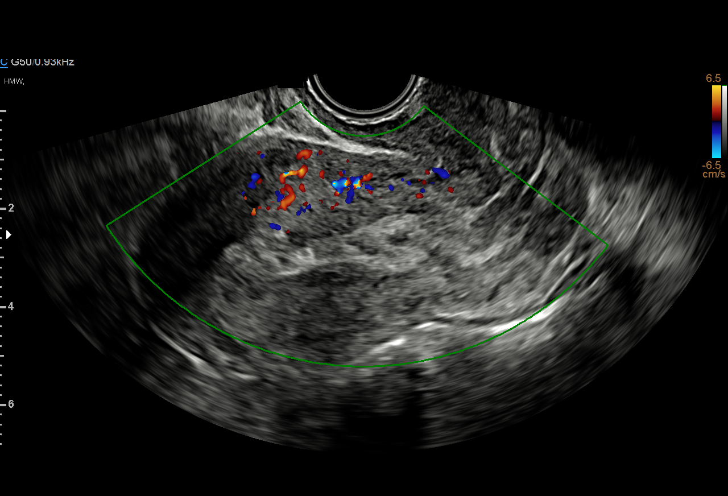
[im 25/33]
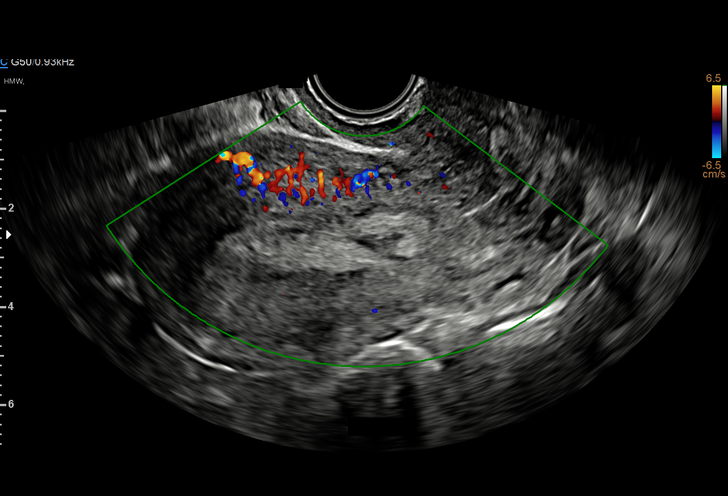
[im 28/33]
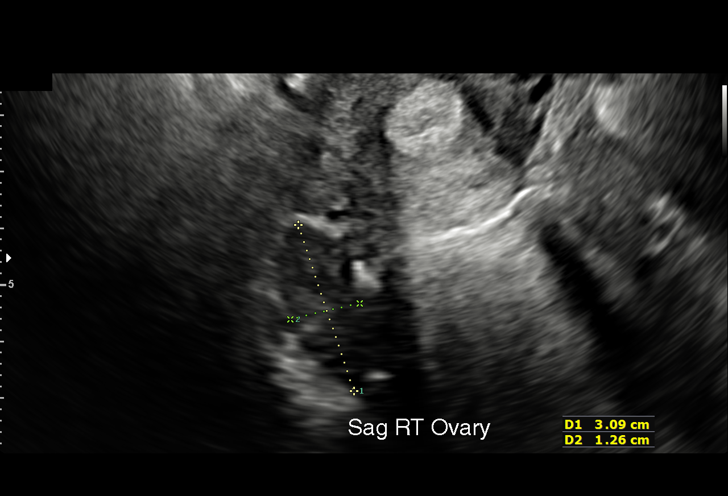
[im 30/33]
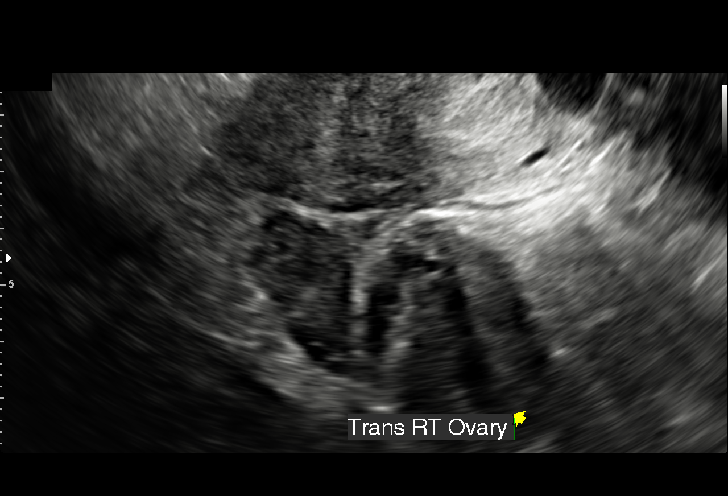
[im 33/33]
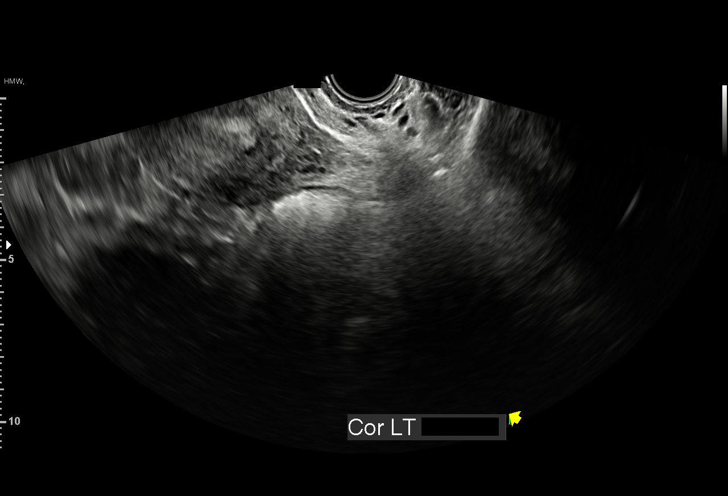

[15 of 28 positions shown; findings below may reference images not displayed]

FINDINGS: Intrauterine gestational sac: None

Yolk sac:  Not Visualized.

Embryo:  Not Visualized.

Cardiac Activity: Not visualized.

Subchorionic hemorrhage:  None visualized.

Maternal uterus/adnexae: Thickened endometrial stripe measuring 10
mm without vascular flow.
IMPRESSION: Findings consistent with failed first trimester pregnancy. Thickened
endometrial stripe without other findings to suggest retained
products of conception.

## 2022-07-24 ENCOUNTER — Other Ambulatory Visit: Payer: Self-pay

## 2022-07-24 ENCOUNTER — Emergency Department
Admission: EM | Admit: 2022-07-24 | Discharge: 2022-07-24 | Disposition: A | Discharge status: Home / Self Care | Payer: Medicaid Other | Attending: Emergency Medicine | Admitting: Emergency Medicine

## 2022-07-24 ENCOUNTER — Emergency Department: Payer: Medicaid Other

## 2022-07-24 DIAGNOSIS — Z3A01 Less than 8 weeks gestation of pregnancy: Secondary | ICD-10-CM | POA: Diagnosis not present

## 2022-07-24 DIAGNOSIS — M545 Low back pain, unspecified: Secondary | ICD-10-CM | POA: Diagnosis not present

## 2022-07-24 DIAGNOSIS — O26891 Other specified pregnancy related conditions, first trimester: Secondary | ICD-10-CM | POA: Diagnosis not present

## 2022-07-24 DIAGNOSIS — R1030 Lower abdominal pain, unspecified: Secondary | ICD-10-CM | POA: Diagnosis not present

## 2022-07-24 LAB — COMPREHENSIVE METABOLIC PANEL
ALT: 19 U/L (ref 0–44)
AST: 18 U/L (ref 15–41)
Albumin: 3.9 g/dL (ref 3.5–5.0)
Alkaline Phosphatase: 65 U/L (ref 38–126)
Anion gap: 9 (ref 5–15)
BUN: 14 mg/dL (ref 6–20)
CO2: 23 mmol/L (ref 22–32)
Calcium: 9.2 mg/dL (ref 8.9–10.3)
Chloride: 109 mmol/L (ref 98–111)
Creatinine, Ser: 0.82 mg/dL (ref 0.44–1.00)
GFR, Estimated: >60 mL/min (ref >60)
Glucose, Bld: 99 mg/dL (ref 70–99)
Potassium: 3.9 mmol/L (ref 3.5–5.1)
Sodium: 141 mmol/L (ref 135–145)
Total Bilirubin: 0.6 mg/dL (ref 0.3–1.2)
Total Protein: 7.6 g/dL (ref 6.5–8.1)

## 2022-07-24 LAB — CBC WITH DIFFERENTIAL/PLATELET
Abs Immature Granulocytes: 0.03 10*3/uL (ref 0.00–0.07)
Basophils Absolute: 0.1 10*3/uL (ref 0.0–0.1)
Basophils Relative: 1 %
Eosinophils Absolute: 0.5 10*3/uL (ref 0.0–0.5)
Eosinophils Relative: 6 %
HCT: 39.8 % (ref 36.0–46.0)
Hemoglobin: 12.2 g/dL (ref 12.0–15.0)
Immature Granulocytes: 0 %
Lymphocytes Relative: 27 %
Lymphs Abs: 2.6 10*3/uL (ref 0.7–4.0)
MCH: 26.2 pg (ref 26.0–34.0)
MCHC: 30.7 g/dL (ref 30.0–36.0)
MCV: 85.4 fL (ref 80.0–100.0)
Monocytes Absolute: 0.6 10*3/uL (ref 0.1–1.0)
Monocytes Relative: 6 %
Neutro Abs: 5.8 10*3/uL (ref 1.7–7.7)
Neutrophils Relative %: 60 %
Platelets: 292 10*3/uL (ref 150–400)
RBC: 4.66 MIL/uL (ref 3.87–5.11)
RDW: 14.2 % (ref 11.5–15.5)
WBC: 9.6 10*3/uL (ref 4.0–10.5)
nRBC: 0 % (ref 0.0–0.2)

## 2022-07-24 LAB — HCG, QUANTITATIVE, PREGNANCY: hCG, Beta Chain, Quant, S: 721 m[IU]/mL — ABNORMAL HIGH (ref <5)

## 2022-07-24 NOTE — ED Provider Triage Note (Signed)
Emergency Medicine Provider Triage Evaluation Note  Kayla [name] , a 26 y.o. female  was evaluated in triage.  Pt complains of lower abd pain with bleeding. Roughly 1 month pregnant. HX of miscarriage with similar symptoms.  Review of Systems  Positive: Vaginal bleeding, pregnant, abd pain Negative: Urinary changes  Physical Exam  BP 125/81 (BP Location: Right Arm)   Pulse 76   Temp 98.2 F (36.8 C) (Oral)   Resp 17   LMP 05/30/2022 (Exact Date)   SpO2 97%  Gen:   Awake, no distress   Resp:  Normal effort  MSK:   Moves extremities without difficulty  Other:    Medical Decision Making  Medically screening exam initiated at 6:42 PM.  Appropriate orders placed.  Kayla [name] was informed that the remainder of the evaluation will be completed by another provider, this initial triage assessment does not replace that evaluation, and the importance of remaining in the ED until their evaluation is complete.  Patient with HX miscarriage and similar symptoms. Ordering labs and [place]   Brynda Peon 07/24/22 1848

## 2022-07-24 NOTE — ED Provider Notes (Signed)
[facility] Provider Note    Event Date/Time   First MD Initiated Contact with Patient 07/24/22 2215     (approximate)   History   Chief Complaint Threatened Miscarriage   HPI  Kayla [name] is a 26 y.o. female, [id number] at approximately 4 weeks of pregnancy, who presents to the ED complaining of abdominal pain.  Patient reports that she has been dealing with about 2 days of intermittent cramping and aching pain in the middle of her lower back as well as the center of her lower abdomen.  Pain has become slightly more intense and constant over the course of the day today, and she has begun to notice some bleeding.  She states that the bleeding was initially light and present only when she wiped.  Bleeding seem to worsen through the day and she reports having to change her pad about 3 times over 12 hours.  She has not noticed any passage of tissue or clots.     Physical Exam   Triage Vital Signs: ED Triage Vitals  Enc Vitals Group     BP 07/24/22 1828 125/81     Pulse Rate 07/24/22 1828 76     Resp 07/24/22 1828 17     Temp 07/24/22 1828 98.2 F (36.8 C)     Temp Source 07/24/22 1828 Oral     SpO2 07/24/22 1828 97 %     Weight --      Height --      Head Circumference --      Peak Flow --      Pain Score 07/24/22 1829 7     Pain Loc --      Pain Edu? --      Excl. in GC? --     Most recent vital signs: Vitals:   07/24/22 1828 07/24/22 2241  BP: 125/81 126/84  Pulse: 76 77  Resp: 17 14  Temp: 98.2 F (36.8 C) 98.2 F (36.8 C)  SpO2: 97% 98%    Constitutional: Alert and oriented. Eyes: Conjunctivae are normal. Head: Atraumatic. Nose: No congestion/rhinnorhea. Mouth/Throat: Mucous membranes are moist.  Cardiovascular: Normal rate, regular rhythm. Grossly normal heart sounds.  2+ radial pulses bilaterally. Respiratory: Normal respiratory effort.  No retractions. Lungs CTAB. Gastrointestinal: Soft and nontender. No  distention. Musculoskeletal: No lower extremity tenderness nor edema.  Neurologic:  Normal speech and language. No gross focal neurologic deficits are appreciated.    ED Results / Procedures / Treatments   Labs (all labs ordered are listed, but only abnormal results are displayed) Labs Reviewed  HCG, QUANTITATIVE, PREGNANCY - Abnormal; Notable for the following components:      Result Value   hCG, Beta Chain, Quant, S 721 (*)    All other components within normal limits  CBC WITH DIFFERENTIAL/PLATELET  COMPREHENSIVE METABOLIC PANEL    RADIOLOGY Obstetric ultrasound reviewed and interpreted by me with no intrauterine pregnancy noted.  PROCEDURES:  Critical Care performed: No  Procedures   MEDICATIONS ORDERED IN ED: Medications - No data to display   IMPRESSION / MDM / ASSESSMENT AND PLAN / ED COURSE  I reviewed the triage vital signs and the nursing notes.                              26 y.o. female [phone] at approximately 4 weeks of pregnancy who presents to the ED complaining of 2 days of increasing pain in  her lower abdomen and back, now with about 12 hours of vaginal bleeding.  Patient's presentation is most consistent with acute presentation with potential threat to life or bodily function.  Differential diagnosis includes, but is not limited to, ectopic pregnancy, threatened miscarriage, completed miscarriage, anemia.  Patient nontoxic-appearing and in no acute distress, vital signs are unremarkable.  She has a benign abdominal exam and describes relatively mild amount of bleeding.  Labs are reassuring with no significant anemia, leukocytosis, electrolyte abnormality, or AKI.  Beta hCG levels at 721, ultrasound with no evidence of intrauterine pregnancy or ectopic pregnancy, consistent with pregnancy of unknown anatomic location.  Patient is Rh+ and there is no indication for RhoGAM.  She is appropriate for discharge home with close follow-up with OB/GYN or back here  in the ED.  She was counseled to be seen in about 1 week for repeat hormone levels and ultrasound, was counseled to return to the ED for new or worsening symptoms.  Patient agrees with plan.      FINAL CLINICAL IMPRESSION(S) / ED DIAGNOSES   Final diagnoses:  Abdominal pain during pregnancy in first trimester     Rx / DC Orders   ED Discharge Orders     None        Note:  This document was prepared using Dragon voice recognition software and may include unintentional dictation errors.   Blake Divine, MD 07/24/22 [phone]

## 2022-07-24 NOTE — Discharge Instructions (Addendum)
Please schedule follow-up appointment with OB/GYN in about 1 week for repeat hormone levels and ultrasound.  If you have any difficulty scheduling this, you should return to the ER for reevaluation for repeat blood work and ultrasound.  If you have worsening pain or bleeding in the meantime, you should also return to the ER for reevaluation.

## 2022-07-24 NOTE — ED Triage Notes (Signed)
Pt presents to ED with c/o of possible threatened  miscarriage. Pt states she is about 1 month pregnant. Pt states she is having ABD pain and vaginal bleeding. Pt states this started 3 days ago. Pt states miscarriage March of '22 and states it feels the same.

## 2022-07-28 ENCOUNTER — Emergency Department
Admission: EM | Admit: 2022-07-28 | Discharge: 2022-07-28 | Disposition: A | Discharge status: Home / Self Care | Payer: Medicaid Other | Attending: Emergency Medicine | Admitting: Emergency Medicine

## 2022-07-28 ENCOUNTER — Emergency Department: Payer: Medicaid Other

## 2022-07-28 ENCOUNTER — Other Ambulatory Visit: Payer: Self-pay

## 2022-07-28 ENCOUNTER — Encounter: Payer: Self-pay | Admitting: Emergency Medicine

## 2022-07-28 DIAGNOSIS — Z3A01 Less than 8 weeks gestation of pregnancy: Secondary | ICD-10-CM | POA: Diagnosis not present

## 2022-07-28 DIAGNOSIS — O4690 Antepartum hemorrhage, unspecified, unspecified trimester: Secondary | ICD-10-CM

## 2022-07-28 DIAGNOSIS — O209 Hemorrhage in early pregnancy, unspecified: Secondary | ICD-10-CM | POA: Diagnosis not present

## 2022-07-28 DIAGNOSIS — G43809 Other migraine, not intractable, without status migrainosus: Secondary | ICD-10-CM

## 2022-07-28 DIAGNOSIS — R109 Unspecified abdominal pain: Secondary | ICD-10-CM | POA: Insufficient documentation

## 2022-07-28 DIAGNOSIS — N9489 Other specified conditions associated with female genital organs and menstrual cycle: Secondary | ICD-10-CM | POA: Insufficient documentation

## 2022-07-28 LAB — COMPREHENSIVE METABOLIC PANEL
ALT: 17 U/L (ref 0–44)
AST: 17 U/L (ref 15–41)
Albumin: 3.8 g/dL (ref 3.5–5.0)
Alkaline Phosphatase: 64 U/L (ref 38–126)
Anion gap: 8 (ref 5–15)
BUN: 18 mg/dL (ref 6–20)
CO2: 24 mmol/L (ref 22–32)
Calcium: 9.2 mg/dL (ref 8.9–10.3)
Chloride: 107 mmol/L (ref 98–111)
Creatinine, Ser: 0.63 mg/dL (ref 0.44–1.00)
GFR, Estimated: >60 mL/min (ref >60)
Glucose, Bld: 142 mg/dL — ABNORMAL HIGH (ref 70–99)
Potassium: 3.8 mmol/L (ref 3.5–5.1)
Sodium: 139 mmol/L (ref 135–145)
Total Bilirubin: 0.5 mg/dL (ref 0.3–1.2)
Total Protein: 7.5 g/dL (ref 6.5–8.1)

## 2022-07-28 LAB — CBC WITH DIFFERENTIAL/PLATELET
Abs Immature Granulocytes: 0.03 10*3/uL (ref 0.00–0.07)
Basophils Absolute: 0 10*3/uL (ref 0.0–0.1)
Basophils Relative: 0 %
Eosinophils Absolute: 0.6 10*3/uL — ABNORMAL HIGH (ref 0.0–0.5)
Eosinophils Relative: 6 %
HCT: 38.4 % (ref 36.0–46.0)
Hemoglobin: 12 g/dL (ref 12.0–15.0)
Immature Granulocytes: 0 %
Lymphocytes Relative: 31 %
Lymphs Abs: 2.9 10*3/uL (ref 0.7–4.0)
MCH: 26.5 pg (ref 26.0–34.0)
MCHC: 31.3 g/dL (ref 30.0–36.0)
MCV: 84.8 fL (ref 80.0–100.0)
Monocytes Absolute: 0.4 10*3/uL (ref 0.1–1.0)
Monocytes Relative: 5 %
Neutro Abs: 5.5 10*3/uL (ref 1.7–7.7)
Neutrophils Relative %: 58 %
Platelets: 295 10*3/uL (ref 150–400)
RBC: 4.53 MIL/uL (ref 3.87–5.11)
RDW: 13.8 % (ref 11.5–15.5)
WBC: 9.6 10*3/uL (ref 4.0–10.5)
nRBC: 0 % (ref 0.0–0.2)

## 2022-07-28 LAB — URINALYSIS, ROUTINE W REFLEX MICROSCOPIC
Bacteria, UA: NONE SEEN
RBC / HPF: >50 RBC/hpf — ABNORMAL HIGH (ref 0–5)
Specific Gravity, Urine: 1.025 (ref 1.005–1.030)

## 2022-07-28 LAB — HCG, QUANTITATIVE, PREGNANCY: hCG, Beta Chain, Quant, S: 26 m[IU]/mL — ABNORMAL HIGH (ref <5)

## 2022-07-28 MED ORDER — DIPHENHYDRAMINE HCL 50 MG/ML IJ SOLN: 25.0000 mg | Freq: Once | INTRAMUSCULAR | Status: DC

## 2022-07-28 MED ORDER — METOCLOPRAMIDE HCL 5 MG/ML IJ SOLN: 10.0000 mg | Freq: Once | INTRAMUSCULAR | Status: DC

## 2022-07-28 MED ORDER — SODIUM CHLORIDE 0.9 % IV BOLUS: 1000.0000 mL | Freq: Once | INTRAVENOUS | Status: DC

## 2022-07-28 NOTE — ED Triage Notes (Signed)
EMS brings pt in from home; to triage via w/c with no distress noted; seen 9/25 for miscarriage; having increased bleeding

## 2022-07-28 NOTE — Discharge Instructions (Signed)
Tylenol 650 mg every 6 hours for headache.  See your obstetrics and gynecology provider to continue to monitor your vaginal bleeding and miscarriage.  Thank you for choosing [place] for your health care today!  Please see your primary doctor this week for a follow up appointment.   If you do not have a primary doctor call the following clinics to establish care:  If you have insurance:  [facility] [phone] [place] [address]   [facility]  [phone] [place] [address]   If you do not have insurance:  [facility]  [phone] [address], [place] [address]  Sometimes, in the early stages of certain disease courses it is difficult to detect in the emergency department evaluation -- so, it is important that you continue to monitor your symptoms and call your doctor right away or return to the emergency department if you develop any new or worsening symptoms.  It was my pleasure to care for you today.   Hoover Brunette Jacelyn Grip, MD

## 2022-07-28 NOTE — ED Provider Notes (Signed)
[facility] Provider Note    Event Date/Time   First MD Initiated Contact with Patient 07/28/22 [phone]     (approximate)   History   Vaginal Bleeding   HPI  Kayla [name] is a 26 y.o. female   Past medical history of pregnancy approximately 5 weeks who was seen in the emergency department earlier this week with vaginal bleeding and abdominal cramping with no identifiable intrauterine pregnancy, concern for miscarriage.  She returns today with migraine headache, with aura, consistent with migraine headaches that she is experienced in the past.  She has had ongoing vaginal bleeding with clots, mild, unchanged since last visit.  She has had no symptoms of blood loss including shortness of breath, fatigue, presyncope.  History was obtained via patient and her spouse who is at bedside.      Physical Exam   Triage Vital Signs: ED Triage Vitals  Enc Vitals Group     BP 07/28/22 0126 (!) 132/91     Pulse Rate 07/28/22 0126 84     Resp 07/28/22 0126 18     Temp 07/28/22 0126 98.3 F (36.8 C)     Temp Source 07/28/22 0126 Oral     SpO2 07/28/22 0126 96 %     Weight 07/28/22 0125 207 lb (93.9 kg)     Height 07/28/22 0125 5\' 4"  (1.626 m)     Head Circumference --      Peak Flow --      Pain Score 07/28/22 0125 10     Pain Loc --      Pain Edu? --      Excl. in [place]? --     Most recent vital signs: Vitals:   07/28/22 0126 07/28/22 0432  BP: (!) 132/91 128/85  Pulse: 84 73  Resp: 18 18  Temp: 98.3 F (36.8 C)   SpO2: 96% 100%    General: Awake, no distress.  CV:  Good peripheral perfusion.  Resp:  Normal effort.  Abd:  No distention.  Nontender.  She has some scant blood on her pad, and the patient is deferring an internal vaginal exam at this time. Other:  HD Appropriate and reassuring, neurologic exam benign, normal motor and sensory, no facial asymmetry, pupils equal reactive, extraocular movements intact, no temporal tenderness, neck  is supple with full range of motion   ED Results / Procedures / Treatments   Labs (all labs ordered are listed, but only abnormal results are displayed) Labs Reviewed  CBC WITH DIFFERENTIAL/PLATELET - Abnormal; Notable for the following components:      Result Value   Eosinophils Absolute 0.6 (*)    All other components within normal limits  COMPREHENSIVE METABOLIC PANEL - Abnormal; Notable for the following components:   Glucose, Bld 142 (*)    All other components within normal limits  URINALYSIS, ROUTINE W REFLEX MICROSCOPIC - Abnormal; Notable for the following components:   Color, Urine RED (*)    APPearance CLOUDY (*)    Glucose, UA   (*)    Value: TEST NOT REPORTED DUE TO COLOR INTERFERENCE OF URINE PIGMENT   Hgb urine dipstick   (*)    Value: TEST NOT REPORTED DUE TO COLOR INTERFERENCE OF URINE PIGMENT   Bilirubin Urine   (*)    Value: TEST NOT REPORTED DUE TO COLOR INTERFERENCE OF URINE PIGMENT   Ketones, ur   (*)    Value: TEST NOT REPORTED DUE TO COLOR INTERFERENCE OF URINE PIGMENT  Protein, ur   (*)    Value: TEST NOT REPORTED DUE TO COLOR INTERFERENCE OF URINE PIGMENT   Nitrite   (*)    Value: TEST NOT REPORTED DUE TO COLOR INTERFERENCE OF URINE PIGMENT   Leukocytes,Ua   (*)    Value: TEST NOT REPORTED DUE TO COLOR INTERFERENCE OF URINE PIGMENT   RBC / HPF >50 (*)    All other components within normal limits  HCG, QUANTITATIVE, PREGNANCY - Abnormal; Notable for the following components:   hCG, Beta Chain, Quant, S 26 (*)    All other components within normal limits     I reviewed labs and they are notable for beta quant 26, markedly decreased from prior.    PROCEDURES:  Critical Care performed: No  Procedures   MEDICATIONS ORDERED IN ED: Medications  metoCLOPramide (REGLAN) injection 10 mg (has no administration in time range)  diphenhydrAMINE (BENADRYL) injection 25 mg (has no administration in time range)  sodium chloride 0.9 % bolus 1,000 mL  (has no administration in time range)    IMPRESSION / MDM / ASSESSMENT AND PLAN / ED COURSE  I reviewed the triage vital signs and the nursing notes.                              Differential diagnosis includes, but is not limited to, likely miscarriage with ongoing vaginal bleeding and a decreasing beta quant, no abdominal cramping and no identifiable IUP, considered but doubt ectopic pregnancy given no masses in the adnexa noted today and no cramping pain, bleeding persistent and unchanged.  Per prior ED note, she is Rh+ and does not need RhoGAM.  He is to establish care with a gynecologist whom she has a referral to already and is planning to call for an appointment to continue to evaluate for miscarriage and trend beta quant.  She has migraine headache that is consistent with prior migraine headaches, will treat with Reglan, fluids, Benadryl.  She has benign neurological exam and her blood pressure is not suggestive of preeclampsia.  Dispo: Patient declined headache medications and would like to be discharged from the emergency department at this time.  She remains in stable condition, plan to follow-up with her obstetrics gynecology provider for vaginal bleeding in pregnancy as above.  Patient's presentation is most consistent with acute presentation with potential threat to life or bodily function.       FINAL CLINICAL IMPRESSION(S) / ED DIAGNOSES   Final diagnoses:  Vaginal bleeding in pregnancy  Other migraine without status migrainosus, not intractable     Rx / DC Orders   ED Discharge Orders     None        Note:  This document was prepared using Dragon voice recognition software and may include unintentional dictation errors.    Pilar Jarvis, MD 07/28/22 [phone]

## 2022-09-25 ENCOUNTER — Other Ambulatory Visit: Payer: Self-pay

## 2022-09-25 ENCOUNTER — Encounter: Payer: Self-pay | Admitting: Emergency Medicine

## 2022-09-25 ENCOUNTER — Emergency Department
Admission: EM | Admit: 2022-09-25 | Discharge: 2022-09-25 | Discharge status: Against Medical Advice | Payer: Medicaid Other | Attending: Emergency Medicine | Admitting: Emergency Medicine

## 2022-09-25 DIAGNOSIS — R21 Rash and other nonspecific skin eruption: Secondary | ICD-10-CM | POA: Insufficient documentation

## 2022-09-25 DIAGNOSIS — L299 Pruritus, unspecified: Secondary | ICD-10-CM | POA: Diagnosis not present

## 2022-09-25 DIAGNOSIS — Z5321 Procedure and treatment not carried out due to patient leaving prior to being seen by health care provider: Secondary | ICD-10-CM | POA: Diagnosis not present

## 2022-09-25 NOTE — ED Notes (Signed)
Patient called x3 with no answer from lobby- [phone], [phone], [phone].

## 2022-09-25 NOTE — ED Triage Notes (Signed)
Patient to ED for rash on back on right leg. Patient states it was been there for 4 days- itching, painful and warm.

## 2022-11-27 ENCOUNTER — Telehealth: Payer: Medicaid Other | Admitting: Physician Assistant

## 2022-11-27 DIAGNOSIS — L700 Acne vulgaris: Secondary | ICD-10-CM | POA: Diagnosis not present

## 2022-11-27 DIAGNOSIS — R0789 Other chest pain: Secondary | ICD-10-CM

## 2022-11-27 DIAGNOSIS — L03211 Cellulitis of face: Secondary | ICD-10-CM

## 2022-11-28 MED ORDER — DOXYCYCLINE HYCLATE 100 MG PO TABS: 100.0000 mg | ORAL_TABLET | Freq: Two times a day (BID) | ORAL | 0 refills | Status: AC

## 2022-11-28 NOTE — Progress Notes (Signed)
Because of severity of symptoms and associated chest pain and weakness, I feel your condition warrants further evaluation and I recommend that you be seen in a face to face visit. If symptoms are still this severe, we recommend an ER assessment. If present but improved, we want you to be evaluated either with your PCP or at a local urgent care.    NOTE: There will be NO CHARGE for this eVisit   If you are having a true medical emergency please call 911.      For an urgent face to face visit, [place] has eight urgent care centers for your convenience:   NEW!! [facility] [place] [facility] [place] Get Driving Directions [phone] [address] [place], [place] [facility] [place] [facility] [place] Get Driving Directions [phone] [place], [place] [address]   [place] [facility] [place] [facility]) Get Driving Directions [phone] [address], [place] [address]  [facility] [place] ([place]) Get Driving Directions [phone] [address] [place],  [place]  [address]  [place] [facility] [place] [facility] - at [address] Get Driving Directions  [phone] [address] [place],  [place] [address]   [facility] Get Driving Directions [phone] [address] [place], [place], [place] [address]   [facility] Get Driving Directions  [phone] [address] [place], [place] [address]   [facility] [place] Get Driving Directions [phone] [address], [place], [place] [address]  Your MyChart E-visit questionnaire answers were reviewed by a board certified advanced clinical practitioner to complete your personal care plan based on your specific symptoms.  Thank you for using e-Visits.

## 2022-11-28 NOTE — Progress Notes (Signed)
We are sorry that you are experiencing this issue.  Here is how we plan to help!  Based on what you shared with me it looks like you have cystic acne.  Acne is a disorder of the hair follicles and oil glands (sebaceous glands). The sebaceous glands secrete oils to keep the skin moist.  When the glands get clogged, it can lead to pimples or cysts.  These cysts may become infected and leave scars. Acne is very common and normally occurs at puberty.  Acne is also inherited.  Your personal care plan consists of the following recommendations:  I recommend that you use a daily cleanser  You might try an over the counter cleanser that has benzoyl peroxide.  I recommend that you start with a product that has 2.5% benzoyl peroxide.  Stronger concentrations have not been shown to be more effective.   I have also prescribed one of the following additional therapies:  Doxycycline an oral antibiotic 100 mg twice a day for two weeks.   If excessive dryness or peeling occurs, reduce dose frequency or concentration of the topical scrubs.  If excessive stinging or burning occurs, remove the topical gel with mild soap and water and resume at a lower dose the next day.  Remember oral antibiotics and topical acne treatments may increase your sensitivity to the sun!  HOME CARE: Do not squeeze pimples because that can often lead to infections, worse acne, and scars. Use a moisturizer that contains retinoid or fruit acids that may inhibit the development of new acne lesions. Although there is not a clear link that foods can cause acne, doctors do believe that too many sweets predispose you to skin problems.  GET HELP RIGHT AWAY IF: If your acne gets worse or is not better within 10 days. If you become depressed. If you become pregnant, discontinue medications and call your OB/GYN.  MAKE SURE YOU: Understand these instructions. Will watch your condition. Will get help right away if you are not doing well or  get worse.  Thank you for choosing an e-visit.  Your e-visit answers were reviewed by a board certified advanced clinical practitioner to complete your personal care plan. Depending upon the condition, your plan could have included both over the counter or prescription medications.  Please review your pharmacy choice. Make sure the pharmacy is open so you can pick up prescription now. If there is a problem, you may contact your provider through [employer] and have the prescription routed to another pharmacy.  Your safety is important to [place]. If you have drug allergies check your prescription carefully.   For the next 24 hours you can use MyChart to ask questions about today's visit, request a non-urgent call back, or ask for a work or school excuse. You will get an email in the next two days asking about your experience. I hope that your e-visit has been valuable and will speed your recovery.

## 2022-11-28 NOTE — Progress Notes (Signed)
I have spent 5 minutes in review of e-visit questionnaire, review and updating patient chart, medical decision making and response to patient.   Genesis Paget Cody Sheronda Parran, PA-C    

## 2022-12-13 ENCOUNTER — Ambulatory Visit (INDEPENDENT_AMBULATORY_CARE_PROVIDER_SITE_OTHER): Payer: Medicaid Other | Admitting: Licensed Practical Nurse

## 2022-12-13 VITALS — BP 127/87 | HR 88 | Ht 64.0 in | Wt 225.4 lb

## 2022-12-13 DIAGNOSIS — N912 Amenorrhea, unspecified: Secondary | ICD-10-CM

## 2022-12-13 DIAGNOSIS — Z6838 Body mass index (BMI) 38.0-38.9, adult: Secondary | ICD-10-CM | POA: Insufficient documentation

## 2022-12-13 DIAGNOSIS — Z3201 Encounter for pregnancy test, result positive: Secondary | ICD-10-CM | POA: Diagnosis not present

## 2022-12-13 DIAGNOSIS — N926 Irregular menstruation, unspecified: Secondary | ICD-10-CM

## 2022-12-13 LAB — POCT URINE PREGNANCY: Preg Test, Ur: POSITIVE — AB

## 2022-12-13 MED ORDER — PRENATAL 27-0.8 MG PO TABS: 1.0000 | ORAL_TABLET | Freq: Every day | ORAL | 12 refills | Status: DC

## 2022-12-13 NOTE — Progress Notes (Signed)
SUBJECTIVE  Kayla [name] is a 27 y.o. female who presents for evaluation of amenorrhea and possible pregnancy. Her LMP was Patient's last menstrual period was 10/14/2022 (approximate).  Normal. She reports regular periods every 28 days, that last 1 week and are light.  Pt was told that she would never be able to become  pregnant because she has had so many misarranges, pt assumed she could not become pregnant so was not using contraception. She is happy to be pregnant.  Current symptoms includenausea, occasionally pain in her abd, lower right side, it comes and goes has been there for 2 weeks, does have her appendix, thinks it may be related to gas pain.   [id number], SAB x 3 at less than 1 month, no complications SXB x 2 in 2021 and 2023 in [place], no complications with pregnancy or birth, gained around 30lbs each time  Lives with her partner and children, feels safe Exercise: yoga and pilates  Does not use Alcohol, cigarettes or illicit drugs   Lat pap after the birth of her last child.    Review of Systems Pertinent items are noted in HPI.  OBJECTIVE BP 127/87   Pulse 88   Ht 5' 4"$  (1.626 m)   Wt 225 lb 6.4 oz (102.2 kg)   LMP 10/14/2022 (Approximate)   BMI 38.69 kg/m  Body mass index is 38.69 kg/m.  alert, well appearing, and in no distress  Lab Review Urine hCG: positive   ASSESSMENT Amenorrhea. Presumed pregnancy at 8weeks and 4 days with an EDD of 07/21/2023   PLAN  Encouraged a well-balanced diet, rest, hydration, prenatal vitamins, and walking for exercise. Counseled to avoid alcohol, tobacco, and recreational drugs and to minimize caffeine intake. Safe medications list given. Discussed non-pharmacologic relief measures for nausea. Reviewed options of midwifery or physician care for prenatal care and birth; she plans to see midwives for this pregnancy Genetic screening options were discussed.  A dating [place] was offered and requested and ordered . Return in 1-2  weeks for a nurse visit for labs and prenatal education, and 2 weeks  for a NOB physical and genetic screening if desired.   Reviewed BMI, rec gaining 10-20lbs   HO for Unisom and B6 given, pt prefers to avoid Zofran d/t concerns for the fetus.   If abd pains worsens, causes her to wake at night or vomit she should be seen in the Ed ASAP.   PNV ordered   Roberto Scales, [facility]  12/13/22  1:02 PM

## 2022-12-20 ENCOUNTER — Ambulatory Visit
Admission: RE | Admit: 2022-12-20 | Discharge: 2022-12-20 | Disposition: A | Discharge status: Home / Self Care | Payer: Medicaid Other | Source: Ambulatory Visit | Attending: Licensed Practical Nurse | Admitting: Licensed Practical Nurse

## 2022-12-20 DIAGNOSIS — Z3201 Encounter for pregnancy test, result positive: Secondary | ICD-10-CM | POA: Insufficient documentation

## 2022-12-21 ENCOUNTER — Ambulatory Visit: Payer: Medicaid Other

## 2022-12-21 VITALS — Ht 64.0 in | Wt 225.0 lb

## 2022-12-21 DIAGNOSIS — Z3480 Encounter for supervision of other normal pregnancy, unspecified trimester: Secondary | ICD-10-CM | POA: Insufficient documentation

## 2022-12-21 DIAGNOSIS — Z1379 Encounter for other screening for genetic and chromosomal anomalies: Secondary | ICD-10-CM

## 2022-12-21 DIAGNOSIS — Z113 Encounter for screening for infections with a predominantly sexual mode of transmission: Secondary | ICD-10-CM

## 2022-12-21 NOTE — Progress Notes (Signed)
New OB Intake  I connected with  Kayla [name] on 12/21/22 at  9:15 AM EST by telephone Video Visit and verified that I am speaking with the correct person using two identifiers. Nurse is located at [employer] and pt is located at home.  I discussed the limitations, risks, security and privacy concerns of performing an evaluation and management service by telephone and the availability of in person appointments. I also discussed with the patient that there may be a patient responsible charge related to this service. The patient expressed understanding and agreed to proceed.  I explained I am completing New OB Intake today. We discussed her EDD of 07/30/2023 that is based on LMP of 10/14/2022 but unsure. Pt is G6/P2. I reviewed her allergies, medications, Medical/Surgical/OB history, and appropriate screenings. There are cats in the home in the home  no Based on history, this is a/an pregnancy uncomplicated .   Patient Active Problem List   Diagnosis Date Noted   BMI 38.0-38.9,adult 12/13/2022   Cholecystitis 04/03/2021    Concerns addressed today  Delivery Plans:  Plans to deliver [facility]  Anatomy US Explained first scheduled [place] will be around 19 weeks.  Labs Discussed maternity genetic screening with patient. Patient WOULD LIKE  genetic testing to be drawn at new OB visit. Discussed possible labs to be drawn at new OB appointment.  COVID Vaccine Patient has not had COVID vaccine.   Social Determinants of Health Food Insecurity: denies food insecurity WIC Referral: Patient is interested in referral to [facility].  Transportation: Patient denies transportation needs. Childcare: Discussed no children allowed at ultrasound appointments.    First visit review I reviewed new OB appt with pt. I explained she will have ob bloodwork and pap smear/pelvic exam if indicated. Explained pt will be seen by Roberto Scales, CNM at first visit; encounter routed to  appropriate provider.   Landis Gandy, [place] 12/21/2022  8:53 AM

## 2022-12-23 ENCOUNTER — Emergency Department
Admission: EM | Admit: 2022-12-23 | Discharge: 2022-12-24 | Disposition: A | Discharge status: Home / Self Care | Payer: Medicaid Other | Attending: Emergency Medicine | Admitting: Emergency Medicine

## 2022-12-23 ENCOUNTER — Other Ambulatory Visit: Payer: Self-pay

## 2022-12-23 DIAGNOSIS — G43809 Other migraine, not intractable, without status migrainosus: Secondary | ICD-10-CM | POA: Insufficient documentation

## 2022-12-23 DIAGNOSIS — O99351 Diseases of the nervous system complicating pregnancy, first trimester: Secondary | ICD-10-CM | POA: Diagnosis not present

## 2022-12-23 DIAGNOSIS — Z3A08 8 weeks gestation of pregnancy: Secondary | ICD-10-CM | POA: Insufficient documentation

## 2022-12-23 DIAGNOSIS — O26891 Other specified pregnancy related conditions, first trimester: Secondary | ICD-10-CM | POA: Diagnosis present

## 2022-12-23 NOTE — ED Notes (Signed)
Pt brought to ed rm 25 at this time, this RN now assuming care.

## 2022-12-23 NOTE — ED Triage Notes (Signed)
Pt to ED via POV c/o left eye pain. Pt states she was having blurry vision in right eye about 20 mins ago but that has resolved. Pt now having pain/pressure behind left eye. No drainage, denies any injury to eye. Pt states she is [redacted] wks pregnant

## 2022-12-24 MED ORDER — DIPHENHYDRAMINE HCL 50 MG/ML IJ SOLN
12.5000 mg | INTRAMUSCULAR | Status: AC
  Administered 2022-12-24: 12.5 mg via INTRAVENOUS
  Filled 2022-12-24: qty 1

## 2022-12-24 MED ORDER — DEXAMETHASONE SODIUM PHOSPHATE 10 MG/ML IJ SOLN
10.0000 mg | Freq: Once | INTRAMUSCULAR | Status: AC
  Administered 2022-12-24: 10 mg via INTRAVENOUS
  Filled 2022-12-24: qty 1

## 2022-12-24 MED ORDER — DROPERIDOL 2.5 MG/ML IJ SOLN
2.5000 mg | Freq: Once | INTRAMUSCULAR | Status: AC
  Administered 2022-12-24: 2.5 mg via INTRAVENOUS
  Filled 2022-12-24: qty 2

## 2022-12-24 MED ORDER — SODIUM CHLORIDE 0.9 % IV BOLUS
500.0000 mL | Freq: Once | INTRAVENOUS | Status: AC
  Administered 2022-12-24: 500 mL via INTRAVENOUS

## 2022-12-24 NOTE — ED Notes (Signed)
Per Dr. Karma Greaser all ordered medications are compatible with pregnancy.

## 2022-12-24 NOTE — ED Provider Notes (Signed)
[facility] Provider Note    Event Date/Time   First MD Initiated Contact with Patient 12/23/22 2338     (approximate)   History   Eye Pain   HPI  Kayla [name] is a 27 y.o. female who is approximately [redacted] weeks pregnant and presents for evaluation of pain in the left side of her forehead and around her left eye.  She said it feels similar to prior migraines she has had but she has not had a migraine for about a year.  She said that she also had a floating spot in her right eye that completely resolved.  Currently she does not feel any visual acuity differences.  She said that she has sharp stabbing pain on the left side of her head, primarily in her forehead but also somewhat involving the left eye.  She has had no recent trauma.  There is no foreign body sensation.  She had some nausea and 1 episode of vomiting at home before coming in.  No new recent medications.  She has been struggling with some morning sickness.     Physical Exam   Triage Vital Signs: ED Triage Vitals  Enc Vitals Group     BP 12/23/22 2308 (!) 141/78     Pulse Rate 12/23/22 2308 91     Resp 12/23/22 2308 16     Temp 12/23/22 2308 98.3 F (36.8 C)     Temp Source 12/23/22 2308 Oral     SpO2 12/23/22 2308 99 %     Weight 12/23/22 2309 102.1 kg (225 lb)     Height 12/23/22 2309 1.626 m ('5\' 4"'$ )     Head Circumference --      Peak Flow --      Pain Score 12/23/22 2309 7     Pain Loc --      Pain Edu? --      Excl. in [place]? --     Most recent vital signs: Vitals:   12/23/22 2308  BP: (!) 141/78  Pulse: 91  Resp: 16  Temp: 98.3 F (36.8 C)  SpO2: 99%     General: Awake, no distress.  CV:  Good peripheral perfusion.  Regular rate and rhythm. Resp:  Normal effort. Speaking easily and comfortably, no accessory muscle usage nor intercostal retractions.   Abd:  No distention.  Other:  Pupils are equal and reactive bilaterally.  No nystagmus.  Normal accommodation.   Extraocular motion is intact.  Patient has mild tenderness to palpation of the forehead and along the maxillary sinuses particularly on the left but it is minimal.  No intraoral abnormalities including no obvious dental infection.   ED Results / Procedures / Treatments   Labs (all labs ordered are listed, but only abnormal results are displayed) Labs Reviewed - No data to display   EKG  I reviewed prior EKGs and she has no indication of QTc prolongation, most notably with QTc interval of 400 ms or less   PROCEDURES:  Critical Care performed: No  Procedures   MEDICATIONS ORDERED IN ED: Medications  sodium chloride 0.9 % bolus 500 mL (500 mLs Intravenous New Bag/Given 12/24/22 0021)  diphenhydrAMINE (BENADRYL) injection 12.5 mg (12.5 mg Intravenous Given 12/24/22 0034)  droperidol (INAPSINE) 2.5 MG/ML injection 2.5 mg (2.5 mg Intravenous Given 12/24/22 0034)  dexamethasone (DECADRON) injection 10 mg (10 mg Intravenous Given 12/24/22 0026)     IMPRESSION / MDM / [place] / ED COURSE  I  reviewed the triage vital signs and the nursing notes.                              Differential diagnosis includes, but is not limited to, migraine, sinusitis, nonspecific headache, acute angle-closure glaucoma, central retinal artery occlusion, central retinal venous occlusion, uveitis/iritis, foreign body.  Patient's presentation is most consistent with acute presentation with potential threat to life or bodily function.  Fortunately the patient has no focal neurological deficits at this time.  Although the differential diagnosis is broad, with more questioning it is clear that the majority of her pain is all throughout the left side of her head and both she and her husband commented that this feels very similar if not identical to prior migraine she has had.  Her husband even pointed out that this happened extensively with prior pregnancies and it seems to be happening again now that she  is pregnant again.  I considered advanced imaging but there is no strong indication for it at this time given her overall well appearance.  I suggested we treat empirically for migraine and she agreed with that plan.  After treatment and reassessment, if she is not feeling better at that point, we can consider additional workup.  Labs/studies ordered: None Interventions/Medications given:  Droperidol 2.5 mg IV, Decadron 10 mg IV, normal saline bolus 500 mL IV, Benadryl 12.5 mg IV. [facility] Course my include additional interventions not listed in this section:)   Clinical Course as of 12/24/22 0150  [name] Dec 24, 2022  0149 Patient reports that her pain is gone and she is ready go home.  Discharging with migraine precautions and follow-up recommendations. [CF]    Clinical Course User Index [CF] Hinda Kehr, MD     FINAL CLINICAL IMPRESSION(S) / ED DIAGNOSES   Final diagnoses:  Other migraine without status migrainosus, not intractable     Rx / DC Orders   ED Discharge Orders     None        Note:  This document was prepared using Dragon voice recognition software and may include unintentional dictation errors.   Hinda Kehr, MD 12/24/22 [phone]

## 2022-12-24 NOTE — ED Notes (Signed)
Pt stated the area to the L medial of the IV was hurting. IV flushed and drew back blood but pt wanted this RN to remove it. IV taken out and another RN is attempting another IV.

## 2022-12-24 NOTE — ED Notes (Signed)
Pt verbalized understanding of DC instructions. Signing pad did not work.

## 2022-12-24 NOTE — ED Notes (Signed)
ED Provider at bedside. 

## 2022-12-29 ENCOUNTER — Other Ambulatory Visit: Payer: Medicaid Other

## 2023-01-05 ENCOUNTER — Encounter: Payer: Medicaid Other | Admitting: Licensed Practical Nurse

## 2023-02-27 ENCOUNTER — Ambulatory Visit: Payer: Medicaid Other | Admitting: Gastroenterology

## 2023-06-14 ENCOUNTER — Other Ambulatory Visit: Payer: Self-pay

## 2023-06-14 DIAGNOSIS — K8021 Calculus of gallbladder without cholecystitis with obstruction: Secondary | ICD-10-CM | POA: Insufficient documentation

## 2023-06-14 DIAGNOSIS — L700 Acne vulgaris: Secondary | ICD-10-CM | POA: Insufficient documentation

## 2023-06-14 DIAGNOSIS — E669 Obesity, unspecified: Secondary | ICD-10-CM | POA: Insufficient documentation

## 2023-06-14 DIAGNOSIS — G44229 Chronic tension-type headache, not intractable: Secondary | ICD-10-CM | POA: Insufficient documentation

## 2023-06-18 ENCOUNTER — Ambulatory Visit: Payer: Medicaid Other | Admitting: Physician Assistant

## 2023-06-18 NOTE — Progress Notes (Deleted)
Celso Amy, PA-C [address]  [address]  [place], [place] [address]  Main: [phone]  Fax: [phone]   Gastroenterology Consultation  Referring Provider:     Norm Salt, [place] Primary Care Physician:  Norm Salt, PA Primary Gastroenterologist:  *** Reason for Consultation:     Abdominal pain, constipation        HPI:   JADEN BURBRIDGE is a 27 y.o. y/o female referred for consultation & management  by Norm Salt, PA.    Referred to evaluate abdominal pain.  Previous cholecystectomy 03/2021 for gallstones.  No recent labs.  OB abdominal ultrasound 12/21/2022 showed viable pregnancy at 8 weeks and 2 days.  Estimated due date 07/30/2023.  CT abdomen pelvis with contrast 03/2022 showed constipation with no other acute abnormality.  Incidental diverticulosis with no diverticulitis.  Umbilical rectus diastases and fat hernia.  Past Medical History:  Diagnosis Date   Anemia    pregnancy   Chlamydia infection 04/03/2021   Gallstones 10/2020   Headache     Past Surgical History:  Procedure Laterality Date   CHOLECYSTECTOMY N/A 04/04/2021   Procedure: LAPAROSCOPIC CHOLECYSTECTOMY;  Surgeon: Axel Filler, MD;  Location: [facility] OR;  Service: General;  Laterality: N/A;    Prior to Admission medications   Medication Sig Start Date End Date Taking? Authorizing Provider  Cetirizine HCl 10 MG CAPS 1 tab(s)    [provider]  Prenatal Vit-Fe Fumarate-FA (MULTIVITAMIN-PRENATAL) 27-0.8 MG TABS tablet Take 1 tablet by mouth daily at 12 noon. 12/13/22   Dominic, Courtney Heys, CNM    Family History  Problem Relation Age of Onset   Hypertension Mother    Other Mother        lymphodema   Hypertension Maternal Grandmother    Hypertension Maternal Grandfather    Other Maternal Grandfather        blood clotting disorder     Social History   Tobacco Use   Smoking status: Never   Smokeless tobacco: Never  Vaping Use   Vaping status:  Never Used  Substance Use Topics   Alcohol use: Not Currently   Drug use: Never    Allergies as of 06/18/2023 - Review Complete 12/23/2022  Allergen Reaction Noted   Shrimp [shellfish allergy] Nausea And Vomiting and Rash 03/16/2020   Crab (diagnostic) Nausea And Vomiting 09/25/2022   Shrimp extract Other (See Comments) 05/21/2022    Review of Systems:    All systems reviewed and negative except where noted in HPI.   Physical Exam:  LMP 10/14/2022 (Approximate)  Patient's last menstrual period was 10/14/2022 (approximate).  General:   Alert,  Well-developed, well-nourished, pleasant and cooperative in NAD Lungs:  Respirations even and unlabored.  Clear throughout to auscultation.   No wheezes, crackles, or rhonchi. No acute distress. Heart:  Regular rate and rhythm; no murmurs, clicks, rubs, or gallops. Abdomen:  Normal bowel sounds.  No bruits.  Soft, and non-distended without masses, hepatosplenomegaly or hernias noted.  No Tenderness.  No guarding or rebound tenderness.    Neurologic:  Alert and oriented x3;  grossly normal neurologically. Psych:  Alert and cooperative. Normal mood and affect.  Imaging Studies: No results found.  Assessment and Plan:   LYRA TURCHETTA is a 27 y.o. y/o female has been referred for   1.  Abdominal pain, most likely due to chronic constipation  2.  Current pregnancy  Follow up ***  Celso Amy, PA-C    BP check ***

## 2023-07-10 ENCOUNTER — Telehealth: Payer: Medicaid Other | Admitting: Physician Assistant

## 2023-07-10 DIAGNOSIS — N898 Other specified noninflammatory disorders of vagina: Secondary | ICD-10-CM

## 2023-07-11 NOTE — Progress Notes (Signed)
Because this is not something we can properly evaluate or manage via an e-visit,  I feel your condition warrants further evaluation and I recommend that you be seen in a face to face visit with your gynecologist or at one of our [facility] clinics.   NOTE: There will be NO CHARGE for this eVisit   If you are having a true medical emergency please call 911.    *Center for [facility] at [employer] [facility]             [address] [place], [place] [address] [phone] (*Take patients with no insurance)  *Center for [employer] at [facility] [address] [place], [place],  [place]  [address] [phone] (*Take patients with no insurance)  Center for [employer] at [employer]                                                             [address], [place], [place], [address] [phone]  Center for [facility] at [facility] [address], [place], [place], [address] [phone]  Center for [facility] at [facility] [address], [place], [place], [address] [phone]  Center for [facility] at [facility]                                 [address] [place], [place], [place], [address] [phone]  Center for [facility] at [facility]                                    [address] [place], [place], [address] [phone]  Center for [facility] at [facility] [address], [place], [place], [address]                              [facility] [address], [place], [place], [address] [phone]  Your MyChart E-visit questionnaire answers were reviewed by a board certified advanced clinical practitioner to complete your personal care plan based on your specific symptoms.  Thank you for using e-Visits.

## 2023-09-27 ENCOUNTER — Telehealth: Payer: Medicaid Other | Admitting: Physician Assistant

## 2023-09-27 NOTE — Patient Instructions (Signed)
We are sorry you missed your appointment today.  However, the issue you mentioned is not one that is handled by the Virtual Urgent Care team as we do not prescribe medications for appetite or weight loss. Below I have given the website for the [facility], where your needs can be better taken care of.  [email or web address]  Best Regards,  Daiva Nakayama, PA-C

## 2023-09-27 NOTE — Progress Notes (Signed)
The patient no-showed for appointment despite this provider waiting for at least 10 minutes from appointment time for patient to join. They will be marked as a No charge/No show for this appointment/time.   Message provided to patient that we are unable to meet her needs/request with information provided for the [facility].   Marked No Charge  Kayla [name], [place]

## 2023-10-14 ENCOUNTER — Inpatient Hospital Stay (HOSPITAL_COMMUNITY): Payer: Medicaid Other

## 2023-10-14 ENCOUNTER — Inpatient Hospital Stay (HOSPITAL_COMMUNITY)
Admission: AD | Admit: 2023-10-14 | Discharge: 2023-10-14 | Disposition: A | Discharge status: Home / Self Care | Payer: Medicaid Other | Attending: Obstetrics & Gynecology | Admitting: Obstetrics & Gynecology

## 2023-10-14 ENCOUNTER — Encounter (HOSPITAL_COMMUNITY): Payer: Self-pay | Admitting: Obstetrics

## 2023-10-14 DIAGNOSIS — O208 Other hemorrhage in early pregnancy: Secondary | ICD-10-CM | POA: Diagnosis not present

## 2023-10-14 DIAGNOSIS — R059 Cough, unspecified: Secondary | ICD-10-CM | POA: Diagnosis present

## 2023-10-14 DIAGNOSIS — N760 Acute vaginitis: Secondary | ICD-10-CM

## 2023-10-14 DIAGNOSIS — O26891 Other specified pregnancy related conditions, first trimester: Secondary | ICD-10-CM | POA: Diagnosis not present

## 2023-10-14 DIAGNOSIS — R109 Unspecified abdominal pain: Secondary | ICD-10-CM | POA: Diagnosis not present

## 2023-10-14 DIAGNOSIS — Z3A01 Less than 8 weeks gestation of pregnancy: Secondary | ICD-10-CM | POA: Diagnosis not present

## 2023-10-14 DIAGNOSIS — R079 Chest pain, unspecified: Secondary | ICD-10-CM

## 2023-10-14 DIAGNOSIS — N949 Unspecified condition associated with female genital organs and menstrual cycle: Secondary | ICD-10-CM | POA: Diagnosis present

## 2023-10-14 DIAGNOSIS — J988 Other specified respiratory disorders: Secondary | ICD-10-CM | POA: Diagnosis not present

## 2023-10-14 DIAGNOSIS — R9431 Abnormal electrocardiogram [ECG] [EKG]: Secondary | ICD-10-CM | POA: Insufficient documentation

## 2023-10-14 DIAGNOSIS — O3680X0 Pregnancy with inconclusive fetal viability, not applicable or unspecified: Secondary | ICD-10-CM | POA: Diagnosis not present

## 2023-10-14 DIAGNOSIS — O23591 Infection of other part of genital tract in pregnancy, first trimester: Secondary | ICD-10-CM | POA: Diagnosis not present

## 2023-10-14 DIAGNOSIS — B9689 Other specified bacterial agents as the cause of diseases classified elsewhere: Secondary | ICD-10-CM | POA: Diagnosis not present

## 2023-10-14 DIAGNOSIS — R0789 Other chest pain: Secondary | ICD-10-CM | POA: Diagnosis present

## 2023-10-14 DIAGNOSIS — O99511 Diseases of the respiratory system complicating pregnancy, first trimester: Secondary | ICD-10-CM | POA: Diagnosis not present

## 2023-10-14 DIAGNOSIS — O418X10 Other specified disorders of amniotic fluid and membranes, first trimester, not applicable or unspecified: Secondary | ICD-10-CM

## 2023-10-14 HISTORY — DX: Chlamydial infection, unspecified: A74.9

## 2023-10-14 HISTORY — DX: Urinary tract infection, site not specified: N39.0

## 2023-10-14 LAB — CBC
HCT: 38.7 % (ref 36.0–46.0)
Hemoglobin: 12.5 g/dL (ref 12.0–15.0)
MCH: 27 pg (ref 26.0–34.0)
MCHC: 32.3 g/dL (ref 30.0–36.0)
MCV: 83.6 fL (ref 80.0–100.0)
Platelets: 273 10*3/uL (ref 150–400)
RBC: 4.63 MIL/uL (ref 3.87–5.11)
RDW: 14.8 % (ref 11.5–15.5)
WBC: 4.8 10*3/uL (ref 4.0–10.5)
nRBC: 0 % (ref 0.0–0.2)

## 2023-10-14 LAB — WET PREP, GENITAL
Sperm: NONE SEEN
Trich, Wet Prep: NONE SEEN
WBC, Wet Prep HPF POC: >=10 — AB (ref <10)
Yeast Wet Prep HPF POC: NONE SEEN

## 2023-10-14 LAB — HIV ANTIBODY (ROUTINE TESTING W REFLEX): HIV Screen 4th Generation wRfx: NONREACTIVE

## 2023-10-14 LAB — HCG, QUANTITATIVE, PREGNANCY: hCG, Beta Chain, Quant, S: 1665 m[IU]/mL — ABNORMAL HIGH (ref <5)

## 2023-10-14 LAB — TROPONIN I (HIGH SENSITIVITY): Troponin I (High Sensitivity): <2 ng/L (ref <18)

## 2023-10-14 MED ORDER — ONDANSETRON 4 MG PO TBDP: 4.0000 mg | ORAL_TABLET | Freq: Three times a day (TID) | ORAL | 0 refills | Status: DC | PRN

## 2023-10-14 MED ORDER — METRONIDAZOLE 500 MG PO TABS: 500.0000 mg | ORAL_TABLET | Freq: Two times a day (BID) | ORAL | 0 refills | Status: AC

## 2023-10-14 NOTE — MAU Note (Signed)
Kayla [name] is a 27 y.o. at Unknown here in MAU reporting: started last night with her chest, when she coughed- she was feeling squeezing on the right side of her chest.  Started feeling pinching pain on her rt shoulder blade. After her nap, she is breathing, but it feels like there a hand on her chest, her chest is very very heavy..  no longer having squeezing or pinching pain.  No pain or SOB, just feels a heaviness.   2 wks ago, was having sharp pain near her ovaries on both sides.  Last Sunday, when she went to have a bowel movement, she was having sharp pain in her ovaries.  Went to [facility], +UPT and US.was told she had ectopic preg and she was 6 wks. Was transferred to [facility].  Was told she was preg, but could not find the preg, they were saying about 4 wks.  Pt was to return to [facility]. Called [name], because she felt like she was getting conflicting info. Was told to get records released.  Was told if she has issues, to come here. Is scheduled in office for Jan &. Still having pain in ovaries (lower abd- both sides), but only at night.  No bleeding.  LMP: 10/24, no cycle in Sept, neg preg tests then Onset of complaint: last night Pain score: no pain, no chest pain, no pain in abd or pelvis Vitals:   10/14/23 1338 10/14/23 1340  BP:  127/78  Pulse:  (!) 108  Resp:  18  Temp:  98.3 F (36.8 C)  SpO2: 100% 98%      Lab orders placed from triage:

## 2023-10-14 NOTE — MAU Provider Note (Signed)
Event Date/Time   First Provider Initiated Contact with Patient 10/14/23 1623    SUBJECTIVE HPI:  [name] is a 27 y.o. [id number] at [redacted]w[redacted]d by LMP who presents to MAU w/ chest pain and squeezing after coughing. No feesl heaviness. Also reports pressure and intermittent pelvic pain. Upon review of the patient's records, [facility], +UPT and US.was told she had ectopic preg and she was 6 wks. Was transferred to [facility] where they Dx'd her with pregnancy of unknown anatomy location and corpus luteum cyst.  Pt was to return to [facility] but called [facility] where she has been a patient before because she felt like she was getting conflicting info. Is scheduled in office for January.  Past Medical History:  Diagnosis Date   Anemia    pregnancy   Chlamydia    Chlamydia infection 04/03/2021   Gallstones 10/2020   Headache    UTI (urinary tract infection)    Past Surgical History:  Procedure Laterality Date   CHOLECYSTECTOMY N/A 04/04/2021   Procedure: LAPAROSCOPIC CHOLECYSTECTOMY;  Surgeon: Axel Filler, MD;  Location: [facility] OR;  Service: General;  Laterality: N/A;   Social History   Socioeconomic History   Marital status: Married    Spouse name: JOSHUA   Number of children: 2   Years of education: Not on file   Highest education level: Associate degree: academic program  Occupational History   Not on file  Tobacco Use   Smoking status: Never   Smokeless tobacco: Never  Vaping Use   Vaping status: Never Used  Substance and Sexual Activity   Alcohol use: Not Currently   Drug use: Never   Sexual activity: Yes  Other Topics Concern   Not on file  Social History Narrative   Not on file   Social Drivers of Health   Financial Resource Strain: Low Risk  (12/21/2022)   Overall Financial Resource Strain (CARDIA)    Difficulty of Paying Living Expenses: Not hard at all  Food Insecurity: No Food Insecurity (12/21/2022)   Hunger Vital Sign     Worried About Running Out of Food in the Last Year: Never true    Ran Out of Food in the Last Year: Never true  Transportation Needs: No Transportation Needs (12/21/2022)   PRAPARE - Transportation    Lack of Transportation (Medical): No    Lack of Transportation (Non-Medical): No  Physical Activity: Insufficiently Active (12/21/2022)   Exercise Vital Sign    Days of Exercise per Week: 2 days    Minutes of Exercise per Session: 20 min  Stress: No Stress Concern Present (12/21/2022)   [employer] of Occupational Health - Occupational Stress Questionnaire    Feeling of Stress : Not at all  Social Connections: Moderately Integrated (12/21/2022)   Social Connection and Isolation Panel [NHANES]    Frequency of Communication with Friends and Family: More than three times a week    Frequency of Social Gatherings with Friends and Family: Twice a week    Attends Religious Services: 1 to 4 times per year    Active Member of [employer] or Organizations: No    Attends Banker Meetings: Never    Marital Status: Married  Catering manager Violence: Not At Risk (12/21/2022)   Humiliation, Afraid, Rape, and Kick questionnaire    Fear of Current or Ex-Partner: No    Emotionally Abused: No    Physically Abused: No    Sexually Abused: No  No current facility-administered medications on file prior to encounter.   Current Outpatient Medications on File Prior to Encounter  Medication Sig Dispense Refill   Cetirizine HCl 10 MG CAPS 1 tab(s)     Prenatal Vit-Fe Fumarate-FA (MULTIVITAMIN-PRENATAL) 27-0.8 MG TABS tablet Take 1 tablet by mouth daily at 12 noon. 30 tablet 12   Allergies  Allergen Reactions   Shrimp [Shellfish Allergy] Nausea And Vomiting and Rash    PT states she also gets a fever    Crab (Diagnostic) Nausea And Vomiting    rash   Shrimp Extract Other (See Comments)    I have reviewed patient's Past Medical Hx, Surgical Hx, Family Hx, Social Hx, medications and allergies.    Review of Systems  Constitutional:  Negative for chills and fever.  Respiratory:  Positive for chest tightness. Negative for cough and wheezing.   Cardiovascular:  Positive for chest pain.  Gastrointestinal:  Positive for abdominal pain. Negative for constipation, diarrhea, nausea and vomiting.  Genitourinary:  Negative for dysuria, frequency, hematuria, urgency, vaginal bleeding and vaginal discharge.   Physical Exam  BP 122/86 (BP Location: Left Arm)   Pulse (!) 107   Temp 99.8 F (37.7 C) (Oral)   Resp 18   Ht 5\' 4"  (1.626 m)   Wt 107.9 kg   LMP 08/23/2023   SpO2 99%   Breastfeeding Unknown   BMI 40.82 kg/m   Patient's last menstrual period was 08/23/2023. GENERAL: Well-developed, well-nourished female in no acute distress.  HEENT: Normocephalic, atraumatic.   LUNGS: Effort normal ABDOMEN: Deferred HEART: Regular rate  SKIN: Warm, dry and without erythema PSYCH: Normal mood and affect NEURO: Alert and oriented x 4  LAB RESULTS Results for orders placed or performed during the hospital encounter of 10/14/23 (from the past 24 hours)  hCG, quantitative, pregnancy     Status: Abnormal   Collection Time: 10/14/23  2:27 PM  Result Value Ref Range   hCG, Beta Chain, Quant, S 1,665 (H) <5 mIU/mL  Troponin I (High Sensitivity)     Status: None   Collection Time: 10/14/23  2:27 PM  Result Value Ref Range   Troponin I (High Sensitivity) <2 <18 ng/L  CBC     Status: None   Collection Time: 10/14/23  2:27 PM  Result Value Ref Range   WBC 4.8 4.0 - 10.5 K/uL   RBC 4.63 3.87 - 5.11 MIL/uL   Hemoglobin 12.5 12.0 - 15.0 g/dL   HCT 78.4 69.6 - 29.5 %   MCV 83.6 80.0 - 100.0 fL   MCH 27.0 26.0 - 34.0 pg   MCHC 32.3 30.0 - 36.0 g/dL   RDW 28.4 13.2 - 44.0 %   Platelets 273 150 - 400 K/uL   nRBC 0.0 0.0 - 0.2 %  Wet prep, genital     Status: Abnormal   Collection Time: 10/14/23  2:45 PM  Result Value Ref Range   Yeast Wet Prep HPF POC NONE SEEN NONE SEEN   Trich, Wet Prep  NONE SEEN NONE SEEN   Clue Cells Wet Prep HPF POC PRESENT (A) NONE SEEN   WBC, Wet Prep HPF POC >=10 (A) <10   Sperm NONE SEEN     IMAGING US OB LESS THAN 14 WEEKS WITH OB TRANSVAGINAL Result Date: 10/14/2023 CLINICAL DATA:  Pelvic pain in 1st trimester pregnancy. EXAM: OBSTETRIC <14 WK [place] AND TRANSVAGINAL OB US TECHNIQUE: Both transabdominal and transvaginal ultrasound examinations were performed for complete evaluation of the gestation as well as  the maternal uterus, adnexal regions, and pelvic cul-de-sac. Transvaginal technique was performed to assess early pregnancy. COMPARISON:  None Available. FINDINGS: Intrauterine gestational sac: Single Yolk sac:  Not Visualized. Embryo:  Not Visualized. Cardiac Activity: Not Visualized. MSD: 4 mm   5 w   1 d Subchorionic hemorrhage:  Large subchorionic hemorrhage seen. Maternal uterus/adnexae: Both ovaries are normal in appearance. No adnexal mass or abnormal free fluid identified. IMPRESSION: Single intrauterine gestational sac measuring 5 weeks 1 day by mean sac diameter. Large subchorionic hemorrhage is a poor prognostic sign. Consider correlation with serial b-hCG levels, and followup ultrasound to assess viability in 10-14 days. Electronically Signed   By: Danae Orleans M.D.   On: 10/14/2023 15:08   MDM - US shows IUP, CLC and no evidence of ectopic pregnancy but there is a large SCH and no FP.   - Chest pain w/ Nml EKG and nml Troponin. Consulted cardiology. Nml EKG. Pain resolved spontaneously. Pain C/W musculoskeletal pain. Low concern for cardiovascular emergency.   ASSESSMENT 1. Abdominal pain during pregnancy in first trimester   2. Chest pain during pregnancy   3. Subchorionic hematoma in first trimester, single or unspecified fetus   4. Pregnancy with uncertain fetal viability, single or unspecified fetus   5. Respiratory infection   6. Bacterial vaginosis     PLAN D/C home in stable condition List of Ob/Gyn providers given.   Follow-up Information     Center for [facility] at [facility]. Call.   Specialty: Obstetrics and Gynecology Why: if unable to get an ultrasound scheduled at your [facility] office Contact information: [address] [place] [address] [phone]        Marlow Baars, MD. Call in 1 day(s).   Specialty: Obstetrics Contact information: [address] [place] [address] [phone]         [facility] Follow up.   Specialty: Obstetrics and Gynecology Contact information: [address] [place] [address] [phone]               Allergies as of 10/14/2023       Reactions   Shrimp [shellfish Allergy] Nausea And Vomiting, Rash   PT states she also gets a fever    Crab (diagnostic) Nausea And Vomiting   rash   Shrimp Extract Other (See Comments)        Medication List     STOP taking these medications    Cetirizine HCl 10 MG Caps       TAKE these medications    metroNIDAZOLE 500 MG tablet Commonly known as: Flagyl Take 1 tablet (500 mg total) by mouth 2 (two) times daily for 7 days.   multivitamin-prenatal 27-0.8 MG Tabs tablet Take 1 tablet by mouth daily at 12 noon.   ondansetron 4 MG disintegrating tablet Commonly known as: ZOFRAN-ODT Take 1 tablet (4 mg total) by mouth every 8 (eight) hours as needed for nausea or vomiting.       [place], CNM 10/14/2023 4:11 PM

## 2023-10-15 LAB — GC/CHLAMYDIA PROBE AMP (~~LOC~~) NOT AT ARMC
Chlamydia: NEGATIVE
Comment: NEGATIVE
Comment: NORMAL
Neisseria Gonorrhea: NEGATIVE

## 2023-10-15 LAB — SYPHILIS: RPR W/REFLEX TO RPR TITER AND TREPONEMAL ANTIBODIES, TRADITIONAL SCREENING AND DIAGNOSIS ALGORITHM: RPR Ser Ql: NONREACTIVE

## 2023-10-28 ENCOUNTER — Telehealth: Payer: Medicaid Other | Admitting: Physician Assistant

## 2023-10-28 DIAGNOSIS — O23599 Infection of other part of genital tract in pregnancy, unspecified trimester: Secondary | ICD-10-CM

## 2023-10-28 DIAGNOSIS — B9689 Other specified bacterial agents as the cause of diseases classified elsewhere: Secondary | ICD-10-CM | POA: Diagnosis not present

## 2023-10-28 DIAGNOSIS — Z3A09 9 weeks gestation of pregnancy: Secondary | ICD-10-CM | POA: Diagnosis not present

## 2023-10-29 MED ORDER — METRONIDAZOLE 500 MG PO TABS: 500.0000 mg | ORAL_TABLET | Freq: Two times a day (BID) | ORAL | 0 refills | Status: DC

## 2023-10-29 NOTE — Progress Notes (Signed)
E-Visit for Vaginal Symptoms  We are sorry that you are not feeling well. Here is how we plan to help! Based on what you shared with me it looks like you: May have a vaginosis due to bacteria  Vaginosis is an inflammation of the vagina that can result in discharge, itching and pain. The cause is usually a change in the normal balance of vaginal bacteria or an infection. Vaginosis can also result from reduced estrogen levels after menopause.  The most common causes of vaginosis are:   Bacterial vaginosis which results from an overgrowth of one on several organisms that are normally present in your vagina.   Yeast infections which are caused by a naturally occurring fungus called candida.   Vaginal atrophy (atrophic vaginosis) which results from the thinning of the vagina from reduced estrogen levels after menopause.   Trichomoniasis which is caused by a parasite and is commonly transmitted by sexual intercourse.  Factors that increase your risk of developing vaginosis include: Medications, such as antibiotics and steroids Uncontrolled diabetes Use of hygiene products such as bubble bath, vaginal spray or vaginal deodorant Douching Wearing damp or tight-fitting clothing Using an intrauterine device (IUD) for birth control Hormonal changes, such as those associated with pregnancy, birth control pills or menopause Sexual activity Having a sexually transmitted infection  Your treatment plan is Metronidazole or Flagyl 500mg twice a day for 7 days.  I have electronically sent this prescription into the pharmacy that you have chosen.  Be sure to take all of the medication as directed. Stop taking any medication if you develop a rash, tongue swelling or shortness of breath. Mothers who are breast feeding should consider pumping and discarding their breast milk while on these antibiotics. However, there is no consensus that infant exposure at these doses would be harmful.  Remember that  medication creams can weaken latex condoms. .   HOME CARE:  Good hygiene may prevent some types of vaginosis from recurring and may relieve some symptoms:  Avoid baths, hot tubs and whirlpool spas. Rinse soap from your outer genital area after a shower, and dry the area well to prevent irritation. Don't use scented or harsh soaps, such as those with deodorant or antibacterial action. Avoid irritants. These include scented tampons and pads. Wipe from front to back after using the toilet. Doing so avoids spreading fecal bacteria to your vagina.  Other things that may help prevent vaginosis include:  Don't douche. Your vagina doesn't require cleansing other than normal bathing. Repetitive douching disrupts the normal organisms that reside in the vagina and can actually increase your risk of vaginal infection. Douching won't clear up a vaginal infection. Use a latex condom. Both female and female latex condoms may help you avoid infections spread by sexual contact. Wear cotton underwear. Also wear pantyhose with a cotton crotch. If you feel comfortable without it, skip wearing underwear to bed. Yeast thrives in moist environments Your symptoms should improve in the next day or two.  GET HELP RIGHT AWAY IF:  You have pain in your lower abdomen ( pelvic area or over your ovaries) You develop nausea or vomiting You develop a fever Your discharge changes or worsens You have persistent pain with intercourse You develop shortness of breath, a rapid pulse, or you faint.  These symptoms could be signs of problems or infections that need to be evaluated by a medical provider now.  MAKE SURE YOU   Understand these instructions. Will watch your condition. Will get help right   away if you are not doing well or get worse.  Thank you for choosing an e-visit.  Your e-visit answers were reviewed by a board certified advanced clinical practitioner to complete your personal care plan. Depending upon the  condition, your plan could have included both over the counter or prescription medications.  Please review your pharmacy choice. Make sure the pharmacy is open so you can pick up prescription now. If there is a problem, you may contact your provider through MyChart messaging and have the prescription routed to another pharmacy.  Your safety is important to us. If you have drug allergies check your prescription carefully.   For the next 24 hours you can use MyChart to ask questions about today's visit, request a non-urgent call back, or ask for a work or school excuse. You will get an email in the next two days asking about your experience. I hope that your e-visit has been valuable and will speed your recovery.  I have spent 5 minutes in review of e-visit questionnaire, review and updating patient chart, medical decision making and response to patient.   Siddalee Vanderheiden M Lujean Ebright, PA-C  

## 2023-11-02 ENCOUNTER — Encounter (HOSPITAL_COMMUNITY): Payer: Self-pay | Admitting: Obstetrics and Gynecology

## 2023-11-02 ENCOUNTER — Inpatient Hospital Stay (HOSPITAL_COMMUNITY): Payer: Medicaid Other

## 2023-11-02 ENCOUNTER — Other Ambulatory Visit (HOSPITAL_COMMUNITY): Payer: Self-pay

## 2023-11-02 ENCOUNTER — Inpatient Hospital Stay (HOSPITAL_COMMUNITY)
Admission: AD | Admit: 2023-11-02 | Discharge: 2023-11-02 | Disposition: A | Discharge status: Home / Self Care | Payer: Medicaid Other | Attending: Obstetrics and Gynecology | Admitting: Obstetrics and Gynecology

## 2023-11-02 DIAGNOSIS — Z3A10 10 weeks gestation of pregnancy: Secondary | ICD-10-CM | POA: Insufficient documentation

## 2023-11-02 DIAGNOSIS — O034 Incomplete spontaneous abortion without complication: Secondary | ICD-10-CM | POA: Insufficient documentation

## 2023-11-02 MED ORDER — ACETAMINOPHEN 500 MG PO TABS
1000.0000 mg | ORAL_TABLET | Freq: Once | ORAL | Status: AC
  Administered 2023-11-02: 1000 mg via ORAL
  Filled 2023-11-02: qty 2

## 2023-11-02 MED ORDER — MISOPROSTOL 200 MCG PO TABS: ORAL_TABLET | ORAL | 0 refills | Status: AC

## 2023-11-02 MED ORDER — IBUPROFEN 800 MG PO TABS
800.0000 mg | ORAL_TABLET | Freq: Once | ORAL | Status: AC
  Administered 2023-11-02: 800 mg via ORAL
  Filled 2023-11-02: qty 1

## 2023-11-02 MED ORDER — OXYCODONE HCL 5 MG PO TABS: 5.0000 mg | ORAL_TABLET | Freq: Four times a day (QID) | ORAL | 0 refills | Status: DC | PRN

## 2023-11-02 NOTE — MAU Provider Note (Signed)
 Chief Complaint: Vaginal Bleeding  SUBJECTIVE HPI: Kayla [name] is a 28 y.o. [id number] at [redacted]w[redacted]d by early ultrasound who presents to maternity admissions reporting heavy bleeding and passing of clots.  Patient notes beginning to bleed heavily this evening. Started to have very painful cramps to the point she was crying in the car on the way here. Has passed 2 large (golf ball+) sized clots with possible tissue inside. Cramping has been marginally better after passage of these clots. Denies LOF, urinary symptoms, change in vaginal discharge otherwise, fever/chills. Known large subchorionic hemorrhage.  HPI  Past Medical History:  Diagnosis Date   Anemia    pregnancy   Chlamydia    Chlamydia infection 04/03/2021   Gallstones 10/2020   Headache    UTI (urinary tract infection)    Past Surgical History:  Procedure Laterality Date   CHOLECYSTECTOMY N/A 04/04/2021   Procedure: LAPAROSCOPIC CHOLECYSTECTOMY;  Surgeon: Rubin Calamity, MD;  Location: [facility] OR;  Service: General;  Laterality: N/A;   Social History   Socioeconomic History   Marital status: Married    Spouse name: JOSHUA   Number of children: 2   Years of education: Not on file   Highest education level: Associate degree: academic program  Occupational History   Not on file  Tobacco Use   Smoking status: Never   Smokeless tobacco: Never  Vaping Use   Vaping status: Never Used  Substance and Sexual Activity   Alcohol use: Not Currently   Drug use: Never   Sexual activity: Yes  Other Topics Concern   Not on file  Social History Narrative   Not on file   Social Drivers of Health   Financial Resource Strain: Low Risk  (12/21/2022)   Overall Financial Resource Strain (CARDIA)    Difficulty of Paying Living Expenses: Not hard at all  Food Insecurity: No Food Insecurity (12/21/2022)   Hunger Vital Sign    Worried About Running Out of Food in the Last Year: Never true    Ran Out of Food in the Last Year: Never  true  Transportation Needs: No Transportation Needs (12/21/2022)   PRAPARE - Transportation    Lack of Transportation (Medical): No    Lack of Transportation (Non-Medical): No  Physical Activity: Insufficiently Active (12/21/2022)   Exercise Vital Sign    Days of Exercise per Week: 2 days    Minutes of Exercise per Session: 20 min  Stress: No Stress Concern Present (12/21/2022)   [employer] of Occupational Health - Occupational Stress Questionnaire    Feeling of Stress : Not at all  Social Connections: Moderately Integrated (12/21/2022)   Social Connection and Isolation Panel [NHANES]    Frequency of Communication with Friends and Family: More than three times a week    Frequency of Social Gatherings with Friends and Family: Twice a week    Attends Religious Services: 1 to 4 times per year    Active Member of [employer] or Organizations: No    Attends Banker Meetings: Never    Marital Status: Married  Catering Manager Violence: Not At Risk (12/21/2022)   Humiliation, Afraid, Rape, and Kick questionnaire    Fear of Current or Ex-Partner: No    Emotionally Abused: No    Physically Abused: No    Sexually Abused: No   No current facility-administered medications on file prior to encounter.   Current Outpatient Medications on File Prior to Encounter  Medication Sig Dispense Refill   metroNIDAZOLE  (FLAGYL ) 500 MG tablet  Take 1 tablet (500 mg total) by mouth 2 (two) times daily for 7 days. 14 tablet 0   ondansetron  (ZOFRAN -ODT) 4 MG disintegrating tablet Take 1 tablet (4 mg total) by mouth every 8 (eight) hours as needed for nausea or vomiting. 20 tablet 0   Prenatal Vit-Fe Fumarate-FA (MULTIVITAMIN-PRENATAL) 27-0.8 MG TABS tablet Take 1 tablet by mouth daily at 12 noon. 30 tablet 12   Allergies  Allergen Reactions   Shrimp [Shellfish Allergy] Nausea And Vomiting and Rash    PT states she also gets a fever    Crab (Diagnostic) Nausea And Vomiting    rash   Shrimp Extract  Other (See Comments)    ROS:  Pertinent positives/negatives listed above.  I have reviewed patient's Past Medical Hx, Surgical Hx, Family Hx, Social Hx, medications and allergies.   Physical Exam  Patient Vitals for the past 24 hrs:  BP Temp Temp src Pulse Resp SpO2 Height Weight  11/02/23 0125 131/81 98.4 F (36.9 C) Oral 80 16 100 % -- --  11/02/23 0110 -- -- -- -- -- -- 5' 4 (1.626 m) 107.3 kg   Constitutional: Well-developed, well-nourished female in no acute distress Cardiovascular: normal rate Respiratory: normal effort GI: Abd soft, non-tender. Pos BS x 4 MS: Extremities nontender, no edema, normal ROM Neurologic: Alert and oriented x 4   PELVIC EXAM: Cervix pink, slightly open. Mild amount of blood still coming through cervical os. Dark. Able to remove 2 small clots from posterior fornix. No tissue seen in the os  LAB RESULTS No results found for this or any previous visit (from the past 24 hours).     IMAGING US  OB Transvaginal Result Date: 11/02/2023 CLINICAL DATA:  [id number] Vaginal bleeding affecting early pregnancy [id number]. This a slice by last menstruated 10 weeks and 1 day. Estimated due date by last menstrual 05/01/2024. Last menstrual period 08/23/2023 EXAM: TRANSVAGINAL OB ULTRASOUND TECHNIQUE: Transvaginal ultrasound was performed for complete evaluation of the gestation as well as the maternal uterus, adnexal regions, and pelvic cul-de-sac. COMPARISON:  Ultrasound Ob 10/14/2023. FINDINGS: Intrauterine gestational sac: Single at the level of the cervix Yolk sac:  Not Visualized. Embryo:  Not Visualized. Cardiac Activity: Not Visualized. MSD: 8.6 mm   5 w   5 d Subchorionic hemorrhage:  None visualized. Maternal uterus/adnexae: Right ovary unremarkable. Not well visualized left ovary. Other: Trace simple free fluid. Hypervascular thickened endometrium measuring up to 19 mm. IMPRESSION: Findings consistent with an inevitable miscarriage. Findings meet definitive criteria  for failed pregnancy. This follows SRU consensus guidelines: Diagnostic Criteria for Nonviable Pregnancy Early in the First Trimester. [facility] [phone]. Recommend follow-up quantitative B-HCG levels to 0 and follow-up US  if clinically indicated. Electronically Signed   By: Morgane  Naveau M.D.   On: 11/02/2023 02:37   US  OB LESS THAN 14 WEEKS WITH OB TRANSVAGINAL Result Date: 10/14/2023 CLINICAL DATA:  Pelvic pain in 1st trimester pregnancy. EXAM: OBSTETRIC <14 WK US  AND TRANSVAGINAL OB US  TECHNIQUE: Both transabdominal and transvaginal ultrasound examinations were performed for complete evaluation of the gestation as well as the maternal uterus, adnexal regions, and pelvic cul-de-sac. Transvaginal technique was performed to assess early pregnancy. COMPARISON:  None Available. FINDINGS: Intrauterine gestational sac: Single Yolk sac:  Not Visualized. Embryo:  Not Visualized. Cardiac Activity: Not Visualized. MSD: 4 mm   5 w   1 d Subchorionic hemorrhage:  Large subchorionic hemorrhage seen. Maternal uterus/adnexae: Both ovaries are normal in appearance. No adnexal mass or abnormal free fluid  identified. IMPRESSION: Single intrauterine gestational sac measuring 5 weeks 1 day by mean sac diameter. Large subchorionic hemorrhage is a poor prognostic sign. Consider correlation with serial b-hCG levels, and followup ultrasound to assess viability in 10-14 days. Electronically Signed   By: Norleen DELENA Kil M.D.   On: 10/14/2023 15:08    MAU Management/MDM: Orders Placed This Encounter  Procedures   US  OB Transvaginal   Discharge patient    Meds ordered this encounter  Medications   acetaminophen  (TYLENOL ) tablet 1,000 mg   ibuprofen  (ADVIL ) tablet 800 mg   oxyCODONE  (ROXICODONE ) 5 MG immediate release tablet    Sig: Take 1 tablet (5 mg total) by mouth every 6 (six) hours as needed for severe pain (pain score 7-10).    Dispense:  15 tablet    Refill:  0   misoprostol  (CYTOTEC ) 200 MCG tablet     Sig: Place four tablets in between your gums and cheeks (two tablets on each side) as instructed. If bleeding continues after 24 hours, repeat dose    Dispense:  8 tablet    Refill:  0    Patient presents with heavy bleeding in early pregnancy and concern for miscarriage. She is reassuringly HDS at this time and denies any dizziness or symptoms to suggest anemia. SSE is consistent with miscarriage in progress, as blood still coming through the os. Ultrasound obtained, as likely incomplete miscarriage at this time. Rh positive.  Ultrasound consistent with failed pregnancy. Gestational sac still present at level of cervix. Discussed with patient that the miscarriage is incomplete at this time. Unsure if she will pass all products of conception on her own, as she still is actively miscarrying. Discussed if she does not pass another clot/tissue and stop bleeding/cramping, it is unlikely that she has completed miscarriage. Discussed using cytotec  if she does not pass more clot/stop bleeding on her own within a few days. She is amenable to this plan. Oxycodone , tylenol , ibuprofen  for pain. Has OB/GYN appointment Monday. Encouraged her to keep this for follow-up. Return precautions for heavy bleeding, septic abortion given. Fetal loss resources given.  ASSESSMENT 1. Incomplete miscarriage   2. [redacted] weeks gestation of pregnancy     PLAN Discharge home with strict return precautions. Allergies as of 11/02/2023       Reactions   Shrimp [shellfish Allergy] Nausea And Vomiting, Rash   PT states she also gets a fever    Crab (diagnostic) Nausea And Vomiting   rash   Shrimp Extract Other (See Comments)        Medication List     STOP taking these medications    multivitamin-prenatal 27-0.8 MG Tabs tablet       TAKE these medications    metroNIDAZOLE  500 MG tablet Commonly known as: FLAGYL  Take 1 tablet (500 mg total) by mouth 2 (two) times daily for 7 days.   misoprostol  200 MCG  tablet Commonly known as: CYTOTEC  Place four tablets in between your gums and cheeks (two tablets on each side) as instructed. If bleeding continues after 24 hours, repeat dose   ondansetron  4 MG disintegrating tablet Commonly known as: ZOFRAN -ODT Take 1 tablet (4 mg total) by mouth every 8 (eight) hours as needed for nausea or vomiting.   oxyCODONE  5 MG immediate release tablet Commonly known as: Roxicodone  Take 1 tablet (5 mg total) by mouth every 6 (six) hours as needed for severe pain (pain score 7-10).         Almarie Moats, MD OB Fellow  11/02/2023  2:59 AM

## 2023-11-02 NOTE — Discharge Instructions (Signed)
Perinatal Grief and loss  [email or web address]

## 2023-11-02 NOTE — MAU Note (Signed)
..  Kayla [name] is a 28 y.o. at [redacted]w[redacted]d here in MAU reporting: vaginal bleeding and cramping that began yesterday.  The pain was worse on the way to MAU but then she felt a clot came out, and her cramping got better.   Pain score: 6/10

## 2023-11-05 ENCOUNTER — Encounter (HOSPITAL_COMMUNITY): Payer: Self-pay | Admitting: Student

## 2023-11-05 ENCOUNTER — Inpatient Hospital Stay (HOSPITAL_COMMUNITY): Payer: Medicaid Other

## 2023-11-05 ENCOUNTER — Inpatient Hospital Stay (HOSPITAL_COMMUNITY)
Admission: AD | Admit: 2023-11-05 | Discharge: 2023-11-05 | Disposition: A | Discharge status: Home / Self Care | Payer: Self-pay | Attending: Student | Admitting: Student

## 2023-11-05 DIAGNOSIS — O034 Incomplete spontaneous abortion without complication: Secondary | ICD-10-CM | POA: Diagnosis present

## 2023-11-05 DIAGNOSIS — Z3A10 10 weeks gestation of pregnancy: Secondary | ICD-10-CM

## 2023-11-05 DIAGNOSIS — R9389 Abnormal findings on diagnostic imaging of other specified body structures: Secondary | ICD-10-CM | POA: Insufficient documentation

## 2023-11-05 DIAGNOSIS — O039 Complete or unspecified spontaneous abortion without complication: Secondary | ICD-10-CM | POA: Diagnosis not present

## 2023-11-05 LAB — HCG, QUANTITATIVE, PREGNANCY: hCG, Beta Chain, Quant, S: 547 m[IU]/mL — ABNORMAL HIGH (ref <5)

## 2023-11-05 LAB — CBC
HCT: 37.9 % (ref 36.0–46.0)
Hemoglobin: 12.2 g/dL (ref 12.0–15.0)
MCH: 27.7 pg (ref 26.0–34.0)
MCHC: 32.2 g/dL (ref 30.0–36.0)
MCV: 86.1 fL (ref 80.0–100.0)
Platelets: 305 10*3/uL (ref 150–400)
RBC: 4.4 MIL/uL (ref 3.87–5.11)
RDW: 14.6 % (ref 11.5–15.5)
WBC: 7.6 10*3/uL (ref 4.0–10.5)
nRBC: 0 % (ref 0.0–0.2)

## 2023-11-05 MED ORDER — ACETAMINOPHEN-CODEINE 300-30 MG PO TABS: 1.0000 | ORAL_TABLET | Freq: Four times a day (QID) | ORAL | 0 refills | Status: AC | PRN

## 2023-11-05 MED ORDER — PROMETHAZINE HCL 25 MG/ML IJ SOLN: 12.5000 mg | Freq: Once | INTRAMUSCULAR | Status: DC

## 2023-11-05 MED ORDER — KETOROLAC TROMETHAMINE 60 MG/2ML IM SOLN
60.0000 mg | Freq: Once | INTRAMUSCULAR | Status: AC
  Administered 2023-11-05: 60 mg via INTRAMUSCULAR
  Filled 2023-11-05: qty 2

## 2023-11-05 MED ORDER — IBUPROFEN 600 MG PO TABS: 600.0000 mg | ORAL_TABLET | Freq: Four times a day (QID) | ORAL | 0 refills | Status: AC | PRN

## 2023-11-05 MED ORDER — ONDANSETRON 4 MG PO TBDP
8.0000 mg | ORAL_TABLET | Freq: Once | ORAL | Status: AC
  Administered 2023-11-05: 8 mg via ORAL
  Filled 2023-11-05: qty 2

## 2023-11-05 MED ORDER — ONDANSETRON 4 MG PO TBDP: 4.0000 mg | ORAL_TABLET | Freq: Three times a day (TID) | ORAL | Status: AC | PRN

## 2023-11-05 NOTE — MAU Provider Note (Signed)
 History     CSN: [id number]  Arrival date and time: 11/05/23 1433   Event Date/Time   First Provider Initiated Contact with Patient 11/05/23 1526      Chief Complaint  Patient presents with   Pelvic Pain   Vaginal Bleeding   Miscarriage   HPI Kayla [name] is a 28 y.o. year old G6P2032 female at [redacted]w[redacted]d weeks gestation who presents to MAU reporting she was in MAU on 11/02/2023 for heavy vaginal bleeding and was diagnosed with an incomplete miscarriage.  She was prescribed Cytotec  to take 4 tablets ukulele and if pregnancy not passed in 24 hours take another 4 pills of Cytotec  buccal he.  She called her doctor's office and told them what she was prescribed and she was advised by her doctor not to take the Cytotec  because her body was doing it on its own.  The patient reports continuing to have painful cramping, passing large clots since yesterday and bleeding going from being moderate to heavy.  She reports she has been taking the Roxicodone  for pain.  She reports she does not like how the Roxicodone  makes her feel; dizzy and lightheaded.  She called the nurse line for [facility] today and was advised to return to MAU for evaluation.  She receives prenatal [facility] at [facility].  Her partner is present and contributing to history taking.  OB History     Gravida  6   Para  2   Term  2   Preterm  0   AB  3   Living  2      SAB  2   IAB  0   Ectopic  0   Multiple  0   Live Births  2           Past Medical History:  Diagnosis Date   Anemia    pregnancy   Chlamydia    Chlamydia infection 04/03/2021   Gallstones 10/2020   Headache    UTI (urinary tract infection)     Past Surgical History:  Procedure Laterality Date   CHOLECYSTECTOMY N/A 04/04/2021   Procedure: LAPAROSCOPIC CHOLECYSTECTOMY;  Surgeon: Rubin Calamity, MD;  Location: [facility] OR;  Service: General;  Laterality: N/A;    Family History  Problem Relation Age of Onset    Hypertension Mother    Other Mother        lymphodema   Hypertension Maternal Grandmother    Hypertension Maternal Grandfather    Other Maternal Grandfather        blood clotting disorder    Social History   Tobacco Use   Smoking status: Never   Smokeless tobacco: Never  Vaping Use   Vaping status: Never Used  Substance Use Topics   Alcohol use: Not Currently   Drug use: Never    Allergies:  Allergies  Allergen Reactions   Shrimp [Shellfish Allergy] Nausea And Vomiting and Rash    PT states she also gets a fever    Crab (Diagnostic) Nausea And Vomiting    rash   Shrimp Extract Other (See Comments)    No medications prior to admission.    Review of Systems  Constitutional: Negative.   HENT: Negative.    Eyes: Negative.   Respiratory: Negative.    Cardiovascular: Negative.   Gastrointestinal:  Positive for nausea.  Endocrine: Negative.   Genitourinary:  Positive for pelvic pain and vaginal bleeding.  Musculoskeletal:  Positive for back pain.  Skin: Negative.  Allergic/Immunologic: Negative.   Neurological: Negative.   Hematological: Negative.   Psychiatric/Behavioral: Negative.     Physical Exam   Blood pressure 129/85, pulse 79, temperature 98.4 F (36.9 C), temperature source Oral, resp. rate 16, height 5' 4 (1.626 m), weight 107.3 kg, last menstrual period 08/23/2023, SpO2 100%, unknown if currently breastfeeding.  Physical Exam Vitals and nursing note reviewed.  Constitutional:      Appearance: Normal appearance. She is obese.  Cardiovascular:     Rate and Rhythm: Normal rate.  Pulmonary:     Effort: Pulmonary effort is normal.  Abdominal:     Palpations: Abdomen is soft.  Genitourinary:    General: Normal vulva.     Comments: Pelvic exam: External genitalia normal, SE: vaginal walls pink and well rugated, cervix is smooth, pink, no lesions, moderate amt of dark, red blood in vaginal vault -- cleared out with several large cotton-tipped swabs,  cervix visually dilated approximately 1.5 cm, bimanual exam deferred.  Musculoskeletal:        General: Normal range of motion.  Skin:    General: Skin is warm and dry.  Neurological:     Mental Status: She is alert and oriented to person, place, and time.  Psychiatric:        Mood and Affect: Mood normal.        Behavior: Behavior normal.        Thought Content: Thought content normal.        Judgment: Judgment normal.    MAU Course  Procedures  MDM CBC HCG Toradol  60 mg IM injection  Zofran  8 mg ODT -- resolved Nausea OB <14 wks TVUS  Results for orders placed or performed during the hospital encounter of 11/05/23 (from the past 48 hours)  CBC     Status: None   Collection Time: 11/05/23  3:59 PM  Result Value Ref Range   WBC 7.6 4.0 - 10.5 K/uL   RBC 4.40 3.87 - 5.11 MIL/uL   Hemoglobin 12.2 12.0 - 15.0 g/dL   HCT 62.0 63.9 - 53.9 %   MCV 86.1 80.0 - 100.0 fL   MCH 27.7 26.0 - 34.0 pg   MCHC 32.2 30.0 - 36.0 g/dL   RDW 85.3 88.4 - 84.4 %   Platelets 305 150 - 400 K/uL   nRBC 0.0 0.0 - 0.2 %    Comment: Performed at [facility], [address], [place], [place] [address]  hCG, quantitative, pregnancy     Status: Abnormal   Collection Time: 11/05/23  6:59 PM  Result Value Ref Range   hCG, Beta Chain, Quant, S 547 (H) <5 mIU/mL    Comment:          GEST. AGE      CONC.  (mIU/mL)   <=1 WEEK        5 - 50     2 WEEKS       50 - 500     3 WEEKS       100 - 10,000     4 WEEKS     1,000 - 30,000     5 WEEKS     3,500 - 115,000   6-8 WEEKS     12,000 - 270,000    12 WEEKS     15,000 - 220,000        FEMALE AND NON-PREGNANT FEMALE:     LESS THAN 5 mIU/mL Performed at [facility], [address] [place],  [place] [address]    US  OB Transvaginal Result Date: 11/05/2023 CLINICAL DATA:  [id number] Abdominal pain during pregnancy in first trimester [id number]. EXAM: TRANSVAGINAL OB ULTRASOUND TECHNIQUE: Transvaginal ultrasound was performed for complete evaluation  of the gestation as well as the maternal uterus, adnexal regions, and pelvic cul-de-sac. COMPARISON:  11/02/2023. FINDINGS: Intrauterine gestational sac: None. Maternal uterus/adnexae: Bilateral ovaries are not visualized. No large adnexal mass seen. Normal-size anteverted uterus.  No focal mass. There is thickened endometrium measuring up to 12 mm. No abnormal vascularity seen on the provided images. Previously seen gestational sac like structure in the cervix is no longer seen, suggesting interval expulsion. IMPRESSION: *No live intrauterine gestational sac seen. Previously seen gestational sac in the cervix is no longer seen, suggesting interval expulsion. There is heterogeneous and thickened endometrium measuring up to 12 mm, which may represent combination of blood products with or without retained products of conception. Correlate clinically. Electronically Signed   By: Ree Molt M.D.   On: 11/05/2023 17:27   Assessment and Plan  1. Miscarriage (Primary) - Advised to take previously prescribed Cytotec  tablets x 4 buccally.  If pregnancy not passed in 24 hours, take the other 4 Cytotec  tablets buccally. - Prescription sent for: Tylenol  3 take 1 to 2 tablets by mouth every 6 hours as needed for pain - Prescription sent for: ibuprofen  600 mg tablet 1 by mouth every 6 hours as needed for pain - Advised to take Zofran  previously prescribed as needed for nausea vomiting - Discussed bleeding precautions and when to return to the hospital  2. [redacted] weeks gestation of pregnancy   - Discharge patient - Call [facility]'s office in the morning to schedule a follow-up after miscarriage appointment - Patient verbalized an understanding of the plan of care and agrees.  Bliss Behnke, CNM 11/05/2023, 3:26 PM

## 2023-11-05 NOTE — MAU Note (Signed)
..  Kayla [name] is a 28 y.o. at [redacted]w[redacted]d here in MAU reporting: painful cramping and passing large clots since yesterday. She was seen in MAU on Thursday and was told she was having an active miscarriage. She decided to go home and let everything pass on it's on. She was prescribed cytotec  but was instructed not to take it by her provider. Her bleeding is moderate to heavy. She is currently taking Oxycodone  for her pain, but feels it makes her dizzy and lightheaded.      Onset of complaint: 11/01/2023 Pain score: 10/10 Vitals:   11/05/23 1510  BP: (!) 148/102  Pulse: 84  Resp: 16  Temp: 98.4 F (36.9 C)  SpO2: 100%     Lab orders placed from triage: UA

## 2024-05-16 ENCOUNTER — Encounter: Payer: Self-pay | Admitting: Advanced Practice Midwife

## 2024-11-19 ENCOUNTER — Other Ambulatory Visit: Payer: Self-pay | Admitting: Certified Nurse Midwife

## 2024-11-19 ENCOUNTER — Telehealth: Payer: Self-pay

## 2024-11-19 DIAGNOSIS — O99210 Obesity complicating pregnancy, unspecified trimester: Secondary | ICD-10-CM
